# Patient Record
Sex: Female | Born: 1950 | Race: Black or African American | Hispanic: No | Marital: Married | Smoking: Never smoker
Health system: Southern US, Community
[De-identification: ages and names within clinical notes are randomized; demographics above are authoritative.]

## PROBLEM LIST (undated history)

## (undated) DIAGNOSIS — E079 Disorder of thyroid, unspecified: Secondary | ICD-10-CM

## (undated) DIAGNOSIS — Z9889 Other specified postprocedural states: Secondary | ICD-10-CM

## (undated) DIAGNOSIS — Z860100 Personal history of colon polyps, unspecified: Secondary | ICD-10-CM

## (undated) DIAGNOSIS — K589 Irritable bowel syndrome without diarrhea: Secondary | ICD-10-CM

## (undated) DIAGNOSIS — M1990 Unspecified osteoarthritis, unspecified site: Secondary | ICD-10-CM

## (undated) DIAGNOSIS — R112 Nausea with vomiting, unspecified: Secondary | ICD-10-CM

## (undated) DIAGNOSIS — Z8489 Family history of other specified conditions: Secondary | ICD-10-CM

## (undated) DIAGNOSIS — K219 Gastro-esophageal reflux disease without esophagitis: Secondary | ICD-10-CM

## (undated) HISTORY — DX: Personal history of colonic polyps: Z86.010

## (undated) HISTORY — DX: Unspecified osteoarthritis, unspecified site: M19.90

## (undated) HISTORY — DX: Disorder of thyroid, unspecified: E07.9

## (undated) HISTORY — DX: Irritable bowel syndrome, unspecified: K58.9

## (undated) HISTORY — DX: Gastro-esophageal reflux disease without esophagitis: K21.9

## (undated) HISTORY — DX: Personal history of colon polyps, unspecified: Z86.0100

## (undated) HISTORY — PX: TUBAL LIGATION: SHX77

## (undated) HISTORY — PX: TONSILLECTOMY: SUR1361

---

## 1990-12-27 HISTORY — PX: KNEE ARTHROSCOPY: SUR90

## 2004-12-27 HISTORY — PX: ROTATOR CUFF REPAIR: SHX139

## 2005-05-01 LAB — HM COLONOSCOPY: HM Colonoscopy: NORMAL

## 2006-07-21 ENCOUNTER — Ambulatory Visit: Payer: Self-pay | Admitting: Specialist

## 2006-09-14 ENCOUNTER — Ambulatory Visit: Payer: Self-pay | Admitting: Specialist

## 2007-03-14 ENCOUNTER — Ambulatory Visit: Payer: Self-pay | Admitting: Family Medicine

## 2007-03-14 ENCOUNTER — Encounter: Payer: Self-pay | Admitting: Family Medicine

## 2008-04-15 ENCOUNTER — Ambulatory Visit: Payer: Self-pay | Admitting: Gynecology

## 2008-04-15 ENCOUNTER — Encounter (INDEPENDENT_AMBULATORY_CARE_PROVIDER_SITE_OTHER): Payer: Self-pay | Admitting: Gynecology

## 2008-04-23 ENCOUNTER — Ambulatory Visit: Payer: Self-pay | Admitting: Gynecology

## 2009-04-17 ENCOUNTER — Encounter: Payer: Self-pay | Admitting: Obstetrics & Gynecology

## 2009-04-17 ENCOUNTER — Encounter: Payer: Self-pay | Admitting: Family Medicine

## 2009-04-17 ENCOUNTER — Ambulatory Visit: Payer: Self-pay | Admitting: Obstetrics & Gynecology

## 2009-04-17 LAB — CONVERTED CEMR LAB
ALT: 14 units/L (ref 0–35)
AST: 17 units/L (ref 0–37)
Albumin: 4.4 g/dL (ref 3.5–5.2)
Alkaline Phosphatase: 49 units/L (ref 39–117)
BUN: 14 mg/dL (ref 6–23)
CO2: 24 meq/L (ref 19–32)
Calcium: 9.5 mg/dL (ref 8.4–10.5)
Chloride: 106 meq/L (ref 96–112)
Cholesterol: 217 mg/dL — ABNORMAL HIGH (ref 0–200)
Creatinine, Ser: 0.95 mg/dL (ref 0.40–1.20)
Glucose, Bld: 88 mg/dL (ref 70–99)
HCT: 41.1 % (ref 36.0–46.0)
HDL: 64 mg/dL (ref >39)
Hemoglobin: 13.7 g/dL (ref 12.0–15.0)
LDL Cholesterol: 130 mg/dL — ABNORMAL HIGH (ref 0–99)
MCHC: 33.3 g/dL (ref 30.0–36.0)
MCV: 83.4 fL (ref 78.0–100.0)
Platelets: 274 10*3/uL (ref 150–400)
Potassium: 4.3 meq/L (ref 3.5–5.3)
RBC: 4.93 M/uL (ref 3.87–5.11)
RDW: 16.2 % — ABNORMAL HIGH (ref 11.5–15.5)
Sodium: 142 meq/L (ref 135–145)
TSH: 2.774 microintl units/mL (ref 0.350–4.500)
Total Bilirubin: 0.6 mg/dL (ref 0.3–1.2)
Total CHOL/HDL Ratio: 3.4
Total Protein: 7.3 g/dL (ref 6.0–8.3)
Triglycerides: 113 mg/dL (ref <150)
VLDL: 23 mg/dL (ref 0–40)
WBC: 6.5 10*3/uL (ref 4.0–10.5)

## 2009-07-02 ENCOUNTER — Ambulatory Visit: Payer: Self-pay | Admitting: Obstetrics & Gynecology

## 2009-07-02 LAB — CONVERTED CEMR LAB: Vit D, 25-Hydroxy: 33 ng/mL (ref 30–89)

## 2010-05-06 ENCOUNTER — Ambulatory Visit: Payer: Self-pay | Admitting: Family Medicine

## 2010-05-06 LAB — CONVERTED CEMR LAB
ALT: 21 units/L (ref 0–35)
AST: 22 units/L (ref 0–37)
Albumin: 4.5 g/dL (ref 3.5–5.2)
Alkaline Phosphatase: 58 units/L (ref 39–117)
BUN: 13 mg/dL (ref 6–23)
CO2: 24 meq/L (ref 19–32)
Calcium: 9.5 mg/dL (ref 8.4–10.5)
Chloride: 105 meq/L (ref 96–112)
Cholesterol: 241 mg/dL — ABNORMAL HIGH (ref 0–200)
Creatinine, Ser: 1.01 mg/dL (ref 0.40–1.20)
Glucose, Bld: 83 mg/dL (ref 70–99)
HCT: 40.7 % (ref 36.0–46.0)
HDL: 67 mg/dL (ref >39)
Hemoglobin: 13.5 g/dL (ref 12.0–15.0)
LDL Cholesterol: 146 mg/dL — ABNORMAL HIGH (ref 0–99)
MCHC: 33.2 g/dL (ref 30.0–36.0)
MCV: 83.9 fL (ref 78.0–100.0)
Platelets: 278 10*3/uL (ref 150–400)
Potassium: 4.8 meq/L (ref 3.5–5.3)
RBC: 4.85 M/uL (ref 3.87–5.11)
RDW: 16.3 % — ABNORMAL HIGH (ref 11.5–15.5)
Sodium: 141 meq/L (ref 135–145)
TSH: 1.385 microintl units/mL (ref 0.350–4.500)
Total Bilirubin: 0.4 mg/dL (ref 0.3–1.2)
Total CHOL/HDL Ratio: 3.6
Total Protein: 7.3 g/dL (ref 6.0–8.3)
Triglycerides: 141 mg/dL (ref <150)
VLDL: 28 mg/dL (ref 0–40)
WBC: 6.9 10*3/uL (ref 4.0–10.5)

## 2010-07-21 ENCOUNTER — Ambulatory Visit: Payer: Self-pay | Admitting: Internal Medicine

## 2010-08-07 ENCOUNTER — Ambulatory Visit: Payer: Self-pay | Admitting: Family Medicine

## 2010-08-07 ENCOUNTER — Encounter: Payer: Self-pay | Admitting: Obstetrics & Gynecology

## 2010-08-07 LAB — CONVERTED CEMR LAB
Cholesterol: 219 mg/dL — ABNORMAL HIGH (ref 0–200)
HDL: 60 mg/dL (ref >39)
LDL Cholesterol: 142 mg/dL — ABNORMAL HIGH (ref 0–99)
Total CHOL/HDL Ratio: 3.7
Triglycerides: 83 mg/dL (ref <150)
VLDL: 17 mg/dL (ref 0–40)

## 2011-05-11 LAB — HM PAP SMEAR: HM Pap smear: NORMAL

## 2011-05-11 NOTE — Assessment & Plan Note (Signed)
NAME:  Katelyn Friedman, Katelyn Friedman NO.:  [id number]   MEDICAL RECORD NO.:  [id number]          PATIENT TYPE:  POB   LOCATION:  [facility] at [facility]         FACILITY:  [facility]   PHYSICIAN:  Johnella Moloney, MD        DATE OF BIRTH:  [birth year]   DATE OF SERVICE:  04/17/2009                                  CLINIC NOTE   Ms. Triska is a 60 year old gravida 2, para 1-0-1-1 postmenopausal female  who is here today for her annual examination.  The patient was last seen  in April 2009 for her annual examination.  She denies any gynecologic  complaints or concerns, specifically denies postmenopausal bleeding,  abnormal vaginal discharge, problems with sexual intercourse, or any  other concerns.  She does feel that some vaginal dryness occasionally  during intercourse but it does not lead to dyspareunia for her and is  alleviated by using K-Y water-based lubricant jelly.  The patient is  also interested in getting lipid profile checked today and thyroid  hormone labs, her blood count labs, and also to check her renal function  labs.  Of note, she does not have a regular primary care physician.   PAST OBSTETRICS/GYNECOLOGIC HISTORY:  Gravida 2, para 1 with one vaginal  delivery and one miscarriage.  The patient had menarche at age 74 and  menopause at age 69.  She has not been on any hormone replacement  therapy.  She denies history of abnormal Pap smears.  Her last Pap was 1  year ago and was normal.  She denies any sexually transmitted  infections.  Her last mammogram was on May 16, 2008, which was negative  and another mammogram will be scheduled at the end of this visit.  Her  last colonoscopy was benign in 2003 and her DEXA scan was done in March  2008 which showed osteopenia in the region of her lumbar spine with a  standard deviation of -2.47.  Her hip DEXA scan was normal with a  standard deviation of negative 0.66.  The patient has been on calcium  1200 mg daily and also vitamin 800  International Units daily for her  osteopenia.  She will be scheduled for a followup DEXA scan at the end  of this visit.   PAST MEDICAL HISTORY:  Osteoarthritis, hypercholesterinemia,  gastroesophageal reflux disease, and genital herpes, last outbreak was 6  months ago.  The patient takes acyclovir during her outbreaks but does  not take any daily suppressive therapy.   PAST SURGICAL HISTORY:  Rotator cuff repair in September 2007 and tubal  ligation in 1984.   MEDICATIONS:  1. Protonix as needed.  2. Acyclovir 400 mg p.o. b.i.d. as needed.  3. Calcium 1200 mg p.o. daily plus vitamin D 800 International Units      p.o. daily.  4. Multivitamin p.o. daily.   ALLERGIES:  The patient has a CODEINE sensitivity that makes her  nauseated, but she does not have any true allergies to any medicine or  food.   SOCIAL HISTORY:  The patient works as an Print production planner for [employer].  She lives with her husband and feels safe at  home.  Denies any past or current history of sexual or physical abuse.  She denies any tobacco, alcohol, or illicit drug use.  The patient  exercises daily by walking and sometimes aerobics.   Family history is remarkable for mother who was diagnosed in early 80s  with Alzheimer disease.  There is also a positive family history for  heart disease and high blood pressure, and she also has a sister who has  DVTs.   REVIEW OF SYSTEMS:  Comprehensive review of systems was reviewed and  entirely negative apart from what was mentioned in the history of  present illness.   PHYSICAL EXAMINATION:  VITAL SIGNS:  Blood pressure is 136/85, pulse 56,  weight 165 pounds, height 5 feet 3 inches.  GENERAL:  No apparent distress.  HEENT:  Normocephalic, atraumatic.  NECK:  Normal neck.  No thyroid enlargement.  LUNGS:  Clear to auscultation bilaterally.  HEART:  Regular rate and rhythm.  BREASTS:  Soft, symmetric in size.  No abnormal masses, nipple   discharge.  Skin changes on lymphadenopathy.  ABDOMEN:  Soft, nontender, nondistended.  No abnormal masses or  organomegaly.  EXTREMITIES:  No cyanosis, clubbing, or edema and nontender.  PELVIC:  Normal external female genitalia.  Pink vagina with some loss  of rugae but normal discharge.  Multiparous cervix with normal contour  and no lesions.  Pap smear sample was obtained.  On bimanual exam,  uterus is small, anteverted, and mobile.  No abnormal masses or  tenderness.  Adnexa are normal in size and are nontender on palpation.   ASSESSMENT AND PLAN:  The patient is a 60 year old gravida 2, para 1-0-1-  1 postmenopausal female who is here for her annual gynecologic  examination.  We will follow up on her Pap result, and the patient will  be booked for a mammogram and a DEXA scan.  She will have labs checked  today which will consist of a lipid panel, TCH, CBC, and CMET.  As per  the patient's request, she is fasting at this time.  These results will  be communicated to the patient as soon as they are available.  The  patient was told to call the GYN Clinic or urge come in for evaluation  for any further gynecologic concerns.           ______________________________  Johnella Moloney, MD     UD/MEDQ  D:  04/17/2009  T:  04/17/2009  Job:  [id number]

## 2011-05-11 NOTE — Assessment & Plan Note (Signed)
NAME:  Katelyn Friedman, MOTHERSHEAD NO.:  [id number]   MEDICAL RECORD NO.:  [id number]          PATIENT TYPE:  POB   LOCATION:  [facility] at [facility]         FACILITY:  [facility]   PHYSICIAN:  Tinnie Gens, MD        DATE OF BIRTH:  09-12-1951   DATE OF SERVICE:  05/06/2010                                  CLINIC NOTE   CHIEF COMPLAINT:  Yearly exam.   HISTORY OF PRESENT ILLNESS:  The patient is a 60 year old gravida 2,  para 1-0-1-1, who comes in today for annual exam.  She is postmenopausal  for approximately 10 years.  She has no significant bleeding.  She is  concerned about weight gain which she has put on approximately 30 pounds  since the last year's visit where she was more actively watching her  diet and exercising.  She has required knee surgery this year since she  has been unable to keep with her normal exercise regimen.  Otherwise,  she is without significant complaint today.   PAST MEDICAL HISTORY:  1. Osteoarthritis.  2. Hypercholesterolemia.  3. GERD.  4. Genital herpes.   PAST SURGICAL HISTORY:  1. Rotator cuff repair.  2. Tubal ligation.  3. Meniscus repair.   MEDICATIONS:  1. Protonix 40 mg p.o. daily.  2. Acyclovir 400 mg p.o. b.i.d. p.r.n.  3. Calcium 1200 mg p.o. daily plus vitamin D 800 international units      p.o. daily.  4. Multivitamin 1 p.o. daily.   ALLERGIES:  CODEINE makes her nauseated.   SOCIAL HISTORY:  She works as an Print production planner for [employer].  She lives with her husband.  No tobacco, alcohol, or  drug use.   FAMILY HISTORY:  She has a mother who has deceased who had Alzheimer  disease.  Her father is in his late 85s and very healthy.  There is a  family history of heart disease, hypertension, and she has a sister who  has had a DVT in the past.   REVIEW OF SYSTEMS:  A 14-point review of systems is reviewed.  She  denies headache, vision changes, trouble swallowing, nausea, vomiting,  diarrhea,  constipation, fevers, chills, abdominal pain, blood in her  stool, blood in her urine, swelling in her feet or ankles, masses in her  breasts, vaginal discharge.   PHYSICAL EXAMINATION:  VITAL SIGNS:  The patient's weight today is 195  pounds, her blood pressure is 153/84.  GENERAL:  She is a well-developed, well-nourished female, in no acute  distress.  HEENT:  Normocephalic, atraumatic.  Sclerae anicteric.  NECK:  Supple.  Normal thyroid.  LUNGS:  Clear bilaterally.  CV:  Regular rate and rhythm without rubs, gallops, or murmurs.  ABDOMEN:  Soft, nontender, nondistended.  EXTREMITIES:  No cyanosis, clubbing, or edema.  2+ distal pulses.  BREASTS:  Symmetric with everted nipples.  No masses.  No  supraclavicular or axillary adenopathy.  PELVIC/GU:  Normal external female genitalia.  BUS is normal.  Vagina is  pink.  Normal amount of discharge.  Minimal loss of rugation.  Cervix is  multiparous without lesion.  Uterus is small, anteverted.  No adnexal  mass  or tenderness.   IMPRESSION:  GYN exam.  The patient had a DEXA scan which showed her to  have a T-score of -2.58 in the lumbar region, the hip, and the femoral  neck.  She actually has pretty good scores and she continues on Fosamax  daily.  Followup will be and approximately in 2 years for this.   PLAN:  Lipid, TSH, CBC, CMET, DEXA next year, mammogram this year.  She  will call the clinic if she wants to have her colonoscopy rescheduled.  I have written prescriptions for her medications for the year.           ______________________________  Tinnie Gens, MD     TP/MEDQ  D:  05/06/2010  T:  05/07/2010  Job:  [id number]

## 2011-05-11 NOTE — Assessment & Plan Note (Signed)
NAME:  Katelyn Friedman, BRUNI NO.:  [id number]   MEDICAL RECORD NO.:  [id number]          PATIENT TYPE:  POB   LOCATION:  [facility]         FACILITY:  [facility]   PHYSICIAN:  Ginger Carne, MD DATE OF BIRTH:  12-Feb-1951   DATE OF SERVICE:  04/15/2008                                  CLINIC NOTE   Ms. Pringle returns today for a yearly gynecologic evaluation.  She has no  cardiac complaints.  Five years ago, she had a colonoscopy which was  reported to have polyps and is due for another.  She is not prepared to  have a colonoscopy at this time; was encouraged to do so and hopefully  will have another in the next year or two, which we will arrange.  The  patient has no specific GI or GU complaints.  The patient in 2008 had a  DEXA bone scan which demonstrated osteopenia.  She has stopped using  Evista or calcium.  She has had no menses for approximately 6 years.   PHYSICAL EXAMINATION:  Her physical examination is normal and can be  found in the check-off sheet for 2009.   IMPRESSION:  Normal gynecological examination post menopause.   PLAN:  I encouraged the patient to have a mammogram, and she will call  back for this.  Also, to start 1000 mg of calcium to take one 500 mg  tablet twice a day, and also to think about having a repeat colonoscopy.           ______________________________  Ginger Carne, MD     SHB/MEDQ  D:  04/15/2008  T:  04/15/2008  Job:  [id number]

## 2011-05-14 NOTE — Assessment & Plan Note (Signed)
NAME:  MARRY, KUSCH NO.:  [id number]   MEDICAL RECORD NO.:  [id number]          PATIENT TYPE:  POB   LOCATION:  [facility] at [facility]         FACILITY:  [facility]   PHYSICIAN:  Matt Holmes, N.P.       DATE OF BIRTH:  1951-03-22   DATE OF SERVICE:                                  CLINIC NOTE   Ms. Bignell is a 60 year old female who presents for her annual exam and  pap smear.  She states that she was taking Evista for hormone  replacement until approximately 3 or 4 months ago, at which time she  discontinued the medication because she was tired of taking it.  She  states she only has a rare hot flash, not needing medication.   PAST MEDICAL HISTORY:  The patient reports a history of arthritis, high  cholesterol and acid reflux, which are unchanged.   REVIEW OF SYSTEMS:  She states that she has muscle aches and occasional  hot flashes.   MENSTRUAL HISTORY:  Menarche at age 46, she is no longer having cycles.  She therefore is not on any contraceptive.  States that she has 2  pregnancies, 1 miscarriage and 1 living child.  She denies history of  abnormal genital cytology.  Her last mammogram was 2 years ago, which  was normal and her last colonoscopy was 3 years ago.   PAST SURGICAL HISTORY:  Rotator cuff in September of 2007 and tubal  ligation in 1984.  She denies having received a blood transfusion.   FAMILY HISTORY:  Positive for heart disease, high blood pressure and a  sister who had blood clots.   VACCINATIONS:  She has been immunized for flu this season.   MEDICATIONS:  Protonix for her acid reflux.   She denies allergies to food or medications.   PHYSICAL EXAMINATION:  VITAL SIGNS:  Pulse 66, blood pressure 120/82,  weight 192, height 5 foot 3 inches.  Ms. Willison is a well-developed, well-nourished female.  HEAD, EARS, EYES, NOSE AND THROAT:  Without thyroid enlargement.  CHEST:  Clear to A and P.  HEART:  Rate and rhythm were regular without murmur,  gallop or cardiac  enlargement.  BREAST:  Bilaterally soft without masses, node or nipple discharge.  ABDOMEN:  Soft and scaphoid, without masses or organomegaly.  PELVIC:  External genitalia within normal limits for female.  Vagina  clean and rugose.  Cervix is parous and clean. Uterus firm without  masses or tenderness. Adnexa bilaterally clear.  Rectovaginal exam  confirms.  EXTREMITIES:  Some limitations to motion of her right shoulder due to  recent rotator cuff surgery.   ASSESSMENT:  1. Post-menopausal female without hormone replacement.  2. Normal GYN exam.   PLAN:  1. Pap results in 1 to 2 weeks.  We will contact the patient if the      pap smear is abnormal.  2. She did mention some soreness on her heel, which she plans to have      evaluated if it worsens with her primary care physician.  3. Bone density and mammogram were scheduled at [facility],      results will be sent here.  She is to return to the office in one      year for her annual exam.           ______________________________  Matt Holmes, N.P.     EMK/MEDQ  D:  03/14/2007  T:  03/15/2007  Job:  [id number]

## 2011-05-27 ENCOUNTER — Ambulatory Visit: Payer: Self-pay | Admitting: Family Medicine

## 2011-06-03 ENCOUNTER — Ambulatory Visit: Payer: Self-pay | Admitting: Obstetrics & Gynecology

## 2011-06-17 ENCOUNTER — Ambulatory Visit: Payer: Self-pay | Admitting: Obstetrics & Gynecology

## 2011-06-23 ENCOUNTER — Ambulatory Visit (INDEPENDENT_AMBULATORY_CARE_PROVIDER_SITE_OTHER): Payer: 59 | Admitting: Obstetrics & Gynecology

## 2011-06-23 DIAGNOSIS — Z01419 Encounter for gynecological examination (general) (routine) without abnormal findings: Secondary | ICD-10-CM

## 2011-06-23 DIAGNOSIS — Z1272 Encounter for screening for malignant neoplasm of vagina: Secondary | ICD-10-CM

## 2011-06-24 NOTE — Assessment & Plan Note (Signed)
NAMEMarland Friedman  Friedman, Katelyn NO.:  [id number]  MEDICAL RECORD NO.:  [id number]           PATIENT TYPE:  LOCATION:  [facility]           FACILITY:  PHYSICIAN:  Jaynie Collins, MD          DATE OF BIRTH:  DATE OF SERVICE:  06/23/2011                                 CLINIC NOTE  REASON FOR VISIT:  Annual examination.  Ms. Katelyn Friedman is a 60 year old gravida 2, para 1-0-1-1 postmenopausal woman who is here today for annual examination.  The patient has no concerns. She is wanting a refill of her medications and wants to be scheduled for her preventative health maintenance studies.  The patient does not have a primary care physician and has been referred for one.  PAST OB/GYN HISTORY:  The patient is menopausal.  She has had one vaginal delivery and one miscarriage.  She denies abnormal Pap smears. She does have a history of genital herpes, but no other sexually transmitted infections.  She has about 1 to 2 outbreaks a year and is on acyclovir suppression.  PAST MEDICAL HISTORY: 1. Osteoporosis. 2. Osteoarthritis. 3. Hypercholesterinemia. 4. GERD. 5. Genital herpes.  PAST SURGICAL HISTORY:  Rotator cuff repair, tubal ligation, meniscus repair.  MEDICATIONS: 1. Protonix 40 mg daily. 2. Acyclovir 40 mg twice a day as needed. 3. Calcium supplement. 4. Multivitamins daily. 5. Fosamax 70 mg weekly.  ALLERGIES:  No known drug allergies.  The patient does report that CODEINE makes her nauseated.  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  No tobacco, alcohol, or illicit drug use.  FAMILY HISTORY:  No family history of any gynecologic cancers.  She does have a sister who had a DVT in the past.  There is a family history of heart disease and hypertension.  REVIEW OF SYSTEMS:  14-point review of systems was reviewed and was entirely negative.  PHYSICAL EXAMINATION:  VITAL SIGNS:  Blood  pressure 138/78, pulse 54, weight is 165 pounds, height 63 inches. GENERAL:  No apparent distress. HEENT:  Normocephalic, atraumatic. NECK:  Supple.  Normal thyroid. LUNGS:  Clear to auscultation bilaterally. HEART:  Regular rate and rhythm. ABDOMEN:  Soft, nontender, and nondistended. EXTREMITIES:  No cyanosis, clubbing, or edema. BREASTS:  Symmetric, no abnormal masses, skin changes, nipple drainage, or lymphadenopathy. PELVIC:  Normal external female genitalia.  Pink, well-rugated vagina. No abnormal discharge seen.  Cervix is multiparous, but cervical stenosis is noted on evaluation.  An attempt was made to obtain an endocervical sample of her Pap smear, but this was not possible. Ectocervical Pap smear was obtained and sent.  On bimanual exam, the patient has a normal-sized uterus, anteverted.  No adnexal masses or tenderness.  ASSESSMENT/PLAN:  The patient is a 60 year old gravida 2, para 1-0-1-1 who is here today for her annual examination.  Pap smear was done, but is likely to be inadequate given the absence of the endocervical component.  If this does result as such she might need to have Cytotec per vagina prior to the next attempt of getting another Pap smear.  The patient would also be scheduled for her mammogram and DEXA scan  to follow up on osteoporosis.  She is also going to be referred for primary care physician for further referral for colonoscopy and her other preventative health maintenance labs will be drawn today including CBC, TSH, CMET, fasting lipid panel, and we will follow up on all these results.  The patient was told to call or come back in for further gynecologic concerns.          ______________________________ Jaynie Collins, MD    UA/MEDQ  D:  06/23/2011  T:  06/24/2011  Job:  [id number]

## 2011-09-16 ENCOUNTER — Other Ambulatory Visit: Payer: Self-pay | Admitting: *Deleted

## 2011-09-16 MED ORDER — VALACYCLOVIR HCL 1 G PO TABS: 1000.0000 mg | ORAL_TABLET | Freq: Every day | ORAL | Status: AC

## 2011-09-16 NOTE — Telephone Encounter (Signed)
Patient wishes to have a 90 day supply of Valtrex (generic).  She can get 90 days at the same cost as 60 days.

## 2012-01-19 ENCOUNTER — Other Ambulatory Visit: Payer: Self-pay | Admitting: Gynecology

## 2012-01-19 DIAGNOSIS — B009 Herpesviral infection, unspecified: Secondary | ICD-10-CM

## 2012-01-19 MED ORDER — ACYCLOVIR 200 MG PO CAPS: 200.0000 mg | ORAL_CAPSULE | Freq: Two times a day (BID) | ORAL | Status: AC

## 2012-04-28 ENCOUNTER — Ambulatory Visit (INDEPENDENT_AMBULATORY_CARE_PROVIDER_SITE_OTHER): Payer: BC Managed Care – PPO | Admitting: Internal Medicine

## 2012-04-28 VITALS — BP 124/66 | HR 60 | Temp 98.2°F | Resp 12 | Ht 62.0 in | Wt 172.0 lb

## 2012-04-28 DIAGNOSIS — F489 Nonpsychotic mental disorder, unspecified: Secondary | ICD-10-CM

## 2012-04-28 DIAGNOSIS — F419 Anxiety disorder, unspecified: Secondary | ICD-10-CM

## 2012-04-28 DIAGNOSIS — B009 Herpesviral infection, unspecified: Secondary | ICD-10-CM

## 2012-04-28 DIAGNOSIS — E669 Obesity, unspecified: Secondary | ICD-10-CM

## 2012-04-28 DIAGNOSIS — M129 Arthropathy, unspecified: Secondary | ICD-10-CM

## 2012-04-28 DIAGNOSIS — J309 Allergic rhinitis, unspecified: Secondary | ICD-10-CM

## 2012-04-28 DIAGNOSIS — Z86010 Personal history of colonic polyps: Secondary | ICD-10-CM

## 2012-04-28 DIAGNOSIS — K219 Gastro-esophageal reflux disease without esophagitis: Secondary | ICD-10-CM

## 2012-04-28 DIAGNOSIS — M1990 Unspecified osteoarthritis, unspecified site: Secondary | ICD-10-CM

## 2012-04-28 DIAGNOSIS — F411 Generalized anxiety disorder: Secondary | ICD-10-CM

## 2012-04-28 MED ORDER — ALPRAZOLAM 0.25 MG PO TABS: 0.2500 mg | ORAL_TABLET | Freq: Every evening | ORAL | Status: DC | PRN

## 2012-04-28 NOTE — Patient Instructions (Signed)
Consider the Low Glycemic Index Diet and 6 smaller meals daily .  This boosts your metabolism and regulates your sugars:   7 AM Low carbohydrate Protein  Shakes (EAS Carb Control  Or Atkins ,  Available everywhere,   In  cases at [employer] )  2.5 carbs  (Add or substitute a toasted sandwhich thin w/ peanut butter)  10 AM: Protein bar by Atkins (snack size,  Chocolate lover's variety at  [employer])    Lunch: sandwich on pita bread or flatbread (Joseph's makes a pita bread and a flat bread , available at [employer] and [employer]; Toufayah makes a low carb flatbread available at [employer] and [employer]) Mission makes a low carb whole wheat tortilla available at [employer],and most grocery stores   3 PM:  Mid day :  Another protein bar,  Or a  cheese stick, 1/4 cup of almonds, walnuts, pistachios, pecans, peanuts,  Macadamia nuts  6 PM  Dinner:  "mean and green:"  Meat/chicken/fish, salad, and green veggie : use ranch, vinagrette,  Blue cheese, etc  9 PM snack : Breyer's low carb fudgsicle or  ice cream bar (Carb Smart), or  Weight Watcher's ice cream bar , or another protein shake  

## 2012-05-01 ENCOUNTER — Encounter: Payer: Self-pay | Admitting: Internal Medicine

## 2012-05-01 DIAGNOSIS — B009 Herpesviral infection, unspecified: Secondary | ICD-10-CM | POA: Insufficient documentation

## 2012-05-01 DIAGNOSIS — E669 Obesity, unspecified: Secondary | ICD-10-CM | POA: Insufficient documentation

## 2012-05-01 DIAGNOSIS — M1990 Unspecified osteoarthritis, unspecified site: Secondary | ICD-10-CM | POA: Insufficient documentation

## 2012-05-01 DIAGNOSIS — F419 Anxiety disorder, unspecified: Secondary | ICD-10-CM | POA: Insufficient documentation

## 2012-05-01 DIAGNOSIS — K219 Gastro-esophageal reflux disease without esophagitis: Secondary | ICD-10-CM | POA: Insufficient documentation

## 2012-05-01 DIAGNOSIS — E663 Overweight: Secondary | ICD-10-CM | POA: Insufficient documentation

## 2012-05-01 DIAGNOSIS — J309 Allergic rhinitis, unspecified: Secondary | ICD-10-CM | POA: Insufficient documentation

## 2012-05-01 DIAGNOSIS — Z86010 Personal history of colonic polyps: Secondary | ICD-10-CM | POA: Insufficient documentation

## 2012-05-01 LAB — HM MAMMOGRAPHY: HM Mammogram: NORMAL

## 2012-05-01 NOTE — Assessment & Plan Note (Signed)
Suppressed with daily acyclovir.

## 2012-05-01 NOTE — Assessment & Plan Note (Signed)
I have addressed  BMI and recommended a low glycemic index diet utilizing smaller more frequent meals (< 200 cal per snack,  Dinner"lean and green" ) to increase metabolism.  I have also recommended that patient start exercising with a goal of 30 minutes of aerobic exercise a minimum of 5 days per week. Screening for lipid disorders, thyroid and diabetes to be done today.

## 2012-05-01 NOTE — Assessment & Plan Note (Signed)
Last colonoscopy was 7 years ago,  Records requested.

## 2012-05-01 NOTE — Assessment & Plan Note (Signed)
Symptoms are managed with daily prilosec but has had GERD for years.   She has no recurrent cough. Will recommended EGD with next colonoscopy.

## 2012-05-01 NOTE — Assessment & Plan Note (Signed)
Trial of low dose alprazolam prn insomnia.

## 2012-05-01 NOTE — Progress Notes (Signed)
Patient ID: Katelyn Friedman, female   DOB: 1951/06/11, 62 y.o.   MRN: [id number]  Patient Active Problem List  Diagnoses  . Arthritis  . Allergic rhinitis  . Herpes simplex  . GERD (gastroesophageal reflux disease)  . History of colon polyps  . Obesity (BMI 30-39.9)  . Insomnia secondary to anxiety    Subjective:  CC:   No chief complaint on file.   HPI:   Katelyn Friedman a 61 y.o. female who presents To establish primary  Care.  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  All issues are chronic and managed with current medications except her insomnia.  She has used alprazolam in the past on an intermittent basis at very low doses and is interested in trying it again.  She walks intermittently for exercise , limite by knee pain, but would like to lose weight. Does not follow a specific diet.     Past Medical History  Diagnosis Date  . Arthritis   . Allergic rhinitis   . Herpes simplex   . GERD (gastroesophageal reflux disease)   . History of colon polyps     Past Surgical History  Procedure Date  . Rotator cuff repair 2006    Reita Chard   . Knee arthroscopy 1992         The following portions of the patient's history were reviewed and updated as appropriate: Allergies, current medications, and problem list.    Review of Systems:   12 Pt  review of systems was negative except those addressed in the HPI,     History   Social History  . Marital Status: Married    Spouse Name: N/A    Number of Children: N/A  . Years of Education: N/A   Occupational History  . Not on file.   Social History Main Topics  . Smoking status: Never Smoker   . Smokeless tobacco: Not on file  . Alcohol Use: No  . Drug Use: Not on file  . Sexually Active: Not on file   Other Topics Concern    . Not on file   Social History Narrative  . No narrative on file    Objective:  BP 124/66  Pulse 60  Temp 98.2 F (36.8 C)  Resp 12  Ht 5\' 2"  (1.575 m)  Wt 172 lb (78.019 kg)  BMI 31.46 kg/m2  SpO2 98%  General appearance: alert, cooperative and appears stated age Ears: normal TM's and external ear canals both ears Throat: lips, mucosa, and tongue normal; teeth and gums normal Neck: no adenopathy, no carotid bruit, supple, symmetrical, trachea midline and thyroid not enlarged, symmetric, no tenderness/mass/nodules Back: symmetric, no curvature. ROM normal. No CVA tenderness. Lungs: clear to auscultation bilaterally Heart: regular rate and rhythm, S1, S2 normal, no murmur, click, rub or gallop Abdomen: soft, non-tender; bowel sounds normal; no masses,  no organomegaly Pulses: 2+ and symmetric Skin: Skin color, texture, turgor normal. No rashes or lesions Lymph nodes: Cervical, supraclavicular, and axillary nodes normal.  Assessment and Plan:  Obesity (BMI 30-39.9) I have addressed  BMI and recommended a low glycemic index diet utilizing smaller more frequent meals (< 200 cal per snack,  Dinner"lean and green" ) to increase metabolism.  I have also recommended that patient start exercising with a goal of  30 minutes of aerobic exercise a minimum of 5 days per week. Screening for lipid disorders, thyroid and diabetes to be done today.    Insomnia secondary to anxiety Trial of low dose alprazolam prn insomnia.   GERD (gastroesophageal reflux disease) Symptoms are managed with daily prilosec but has had GERD for years.   She has no recurrent cough. Will recommended EGD with next colonoscopy.   Herpes simplex Suppressed with daily acyclovir.   History of colon polyps Last colonoscopy was 7 years ago,  Records requested.     Updated Medication List Outpatient Encounter Prescriptions as of 04/28/2012  Medication Sig Dispense Refill  . ALPRAZolam (XANAX) 0.25 MG tablet  Take 1 tablet (0.25 mg total) by mouth at bedtime as needed for anxiety.  30 tablet  3     Orders Placed This Encounter  Procedures  . HM MAMMOGRAPHY  . HM PAP SMEAR  . HM COLONOSCOPY    No Follow-up on file.

## 2012-05-09 ENCOUNTER — Telehealth: Payer: Self-pay | Admitting: Internal Medicine

## 2012-05-09 NOTE — Telephone Encounter (Signed)
Caller: Gwen/Patient; PCP: Duncan Dull; CB#: [phone]; ; ; Call regarding Muscle Spasms, Ibuprofen Not Helping;   Upper back pain since  05/08/2012 and has been tolerable until   today.  Pain has increased and radiates under both shoulder blades. Worsens with mvt and if she takes a deep breath.Have taken Ibuprofen 400mg  doses several times but no relief. Has also tried Heating pad.  No known injury. RN reached See in 24 hrs for pain that intensifies with straining per Back Symptoms.  - RN able to sched appt with Dr Dan Humphreys for  tomorrow  05/10/2012  at 0945.

## 2012-05-10 ENCOUNTER — Ambulatory Visit (INDEPENDENT_AMBULATORY_CARE_PROVIDER_SITE_OTHER): Payer: BC Managed Care – PPO | Admitting: Internal Medicine

## 2012-05-10 ENCOUNTER — Encounter: Payer: Self-pay | Admitting: Internal Medicine

## 2012-05-10 ENCOUNTER — Ambulatory Visit (INDEPENDENT_AMBULATORY_CARE_PROVIDER_SITE_OTHER)
Admission: RE | Admit: 2012-05-10 | Discharge: 2012-05-10 | Disposition: A | Discharge status: Home / Self Care | Payer: BC Managed Care – PPO | Source: Ambulatory Visit | Attending: Internal Medicine | Admitting: Internal Medicine

## 2012-05-10 VITALS — BP 129/85 | HR 58 | Temp 98.2°F | Resp 14 | Wt 171.2 lb

## 2012-05-10 DIAGNOSIS — R071 Chest pain on breathing: Secondary | ICD-10-CM

## 2012-05-10 DIAGNOSIS — R0789 Other chest pain: Secondary | ICD-10-CM | POA: Insufficient documentation

## 2012-05-10 MED ORDER — TRAMADOL HCL 50 MG PO TABS
50.0000 mg | ORAL_TABLET | Freq: Three times a day (TID) | ORAL | Status: AC | PRN
Start: 2012-05-10 — End: 2012-05-20

## 2012-05-10 NOTE — Progress Notes (Signed)
  Subjective:    Patient ID: Katelyn Friedman, female    DOB: [birth year], 61 y.o.   MRN: [id number]  HPI 61 year old female presents for acute visit complaining of 2 day history of right middle back pain. She denies any trauma or change in her physical activity. She describes the pain as deep and aching. It is worsened with movement and with taking a deep breath. It is not associated with cough, shortness of breath, palpitations, or other symptoms. She has not had fever or chills. She has been taking ibuprofen with some improvement in her pain.  Outpatient Encounter Prescriptions as of 05/10/2012  Medication Sig Dispense Refill  . ALPRAZolam (XANAX) 0.25 MG tablet Take 1 tablet (0.25 mg total) by mouth at bedtime as needed for anxiety.  30 tablet  3  . aspirin 81 MG tablet Take 81 mg by mouth every other day.      . Calcium Carbonate-Vitamin D (CALCIUM-VITAMIN D3 PO) Take by mouth daily.      [name] ibuprofen (ADVIL,MOTRIN) 200 MG tablet Take 200 mg by mouth every 6 (six) hours as needed.      . Multiple Vitamin (MULTIVITAMIN) tablet Take 1 tablet by mouth daily.      . traMADol (ULTRAM) 50 MG tablet Take 1 tablet (50 mg total) by mouth every 8 (eight) hours as needed for pain.  30 tablet  0    Review of Systems  Constitutional: Negative for fever, chills, diaphoresis and fatigue.  HENT: Negative for congestion.   Respiratory: Negative for cough and shortness of breath.   Cardiovascular: Positive for chest pain. Negative for palpitations and leg swelling.  Gastrointestinal: Negative for abdominal pain.  Musculoskeletal: Positive for myalgias, back pain and arthralgias.  Skin: Negative for color change and rash.      BP 129/85  Pulse 58  Temp(Src) 98.2 F (36.8 C) (Oral)  Resp 14  Wt 171 lb 4 oz (77.678 kg)  SpO2 99%  Objective:   Physical Exam  Constitutional: She is oriented to person, place, and time. She appears well-developed and well-nourished. No distress.  HENT:  Head:  Normocephalic and atraumatic.  Right Ear: External ear normal.  Left Ear: External ear normal.  Nose: Nose normal.  Mouth/Throat: Oropharynx is clear and moist.  Eyes: Conjunctivae are normal. Pupils are equal, round, and reactive to light. Right eye exhibits no discharge. Left eye exhibits no discharge. No scleral icterus.  Neck: Normal range of motion. Neck supple. No tracheal deviation present. No thyromegaly present.  Cardiovascular: Normal rate, regular rhythm, normal heart sounds and intact distal pulses.  Exam reveals no gallop and no friction rub.   No murmur heard. Pulmonary/Chest: Effort normal and breath sounds normal. No accessory muscle usage. Not tachypneic. No respiratory distress. She has no decreased breath sounds. She has no wheezes. She has no rhonchi. She has no rales.   She exhibits no tenderness.  Musculoskeletal: Normal range of motion. She exhibits no edema and no tenderness.  Lymphadenopathy:    She has no cervical adenopathy.  Neurological: She is alert and oriented to person, place, and time. No cranial nerve deficit. She exhibits normal muscle tone. Coordination normal.  Skin: Skin is warm and dry. No rash noted. She is not diaphoretic. No erythema. No pallor.  Psychiatric: She has a normal mood and affect. Her behavior is normal. Judgment and thought content normal.          Assessment & Plan:

## 2012-05-10 NOTE — Assessment & Plan Note (Signed)
Pt with point tenderness right posterior chest wall. Question rib fracture versus vertebral compression fracture with nerve compression. Will get CXR for evaluation. Will continue Ibuprofen 800mg  po tid and add Tramadol for breakthrough pain.  Pt will apply ice to area 10-24min tid. Follow up prn.

## 2012-05-11 ENCOUNTER — Telehealth: Payer: Self-pay | Admitting: Internal Medicine

## 2012-05-11 NOTE — Telephone Encounter (Signed)
[phone] Pt called to get results of her xray done yesterday @ [address]

## 2012-05-11 NOTE — Telephone Encounter (Signed)
Patient notified of results.  Dr. Dan Humphreys released them through MyChart.

## 2012-12-27 HISTORY — PX: BREAST BIOPSY: SHX20

## 2013-01-26 ENCOUNTER — Ambulatory Visit (INDEPENDENT_AMBULATORY_CARE_PROVIDER_SITE_OTHER): Payer: BC Managed Care – PPO | Admitting: Internal Medicine

## 2013-01-26 ENCOUNTER — Encounter: Payer: Self-pay | Admitting: Internal Medicine

## 2013-01-26 VITALS — BP 132/84 | HR 77 | Temp 97.7°F | Resp 16 | Wt 197.5 lb

## 2013-01-26 DIAGNOSIS — M5440 Lumbago with sciatica, unspecified side: Secondary | ICD-10-CM

## 2013-01-26 DIAGNOSIS — M5430 Sciatica, unspecified side: Secondary | ICD-10-CM

## 2013-01-26 MED ORDER — PREDNISONE (PAK) 10 MG PO TABS: ORAL_TABLET | ORAL | Status: DC

## 2013-01-26 MED ORDER — TRAMADOL HCL 50 MG PO TABS: 50.0000 mg | ORAL_TABLET | Freq: Four times a day (QID) | ORAL | Status: DC | PRN

## 2013-01-26 NOTE — Progress Notes (Signed)
Patient ID: Katelyn Friedman, female   DOB: 11-12-1951, 62 y.o.   MRN: [id number]  Patient Active Problem List  Diagnosis  . Arthritis  . Allergic rhinitis  . Herpes simplex  . GERD (gastroesophageal reflux disease)  . History of colon polyps  . Obesity (BMI 30-39.9)  . Insomnia secondary to anxiety  . Right-sided chest wall pain  . Acute back pain with sciatica    Subjective:  CC:   Chief Complaint  Patient presents with  . hip pain    Right side    HPI:   Katelyn Friedman a 62 y.o. female who presents  Past Medical History  Diagnosis Date  . Arthritis   . Allergic rhinitis   . Herpes simplex   . GERD (gastroesophageal reflux disease)   . History of colon polyps     Past Surgical History  Procedure Date  . Rotator cuff repair 2006    Reita Chard   . Knee arthroscopy 1992         The following portions of the patient's history were reviewed and updated as appropriate: Allergies, current medications, and problem list.    Review of Systems:   12 Pt  review of systems was negative except those addressed in the HPI,     History   Social History  . Marital Status: Married    Spouse Name: N/A    Number of Children: N/A  . Years of Education: N/A   Occupational History  . Not on file.   Social History Main Topics  . Smoking status: Never Smoker   . Smokeless tobacco: Not on file  . Alcohol Use: No  . Drug Use: Not on file  . Sexually Active: Not on file   Other Topics Concern  . Not on file   Social History Narrative  . No narrative on file    Objective:  BP 132/84  Pulse 77  Temp 97.7 F (36.5 C) (Oral)  Resp 16  Wt 197 lb 8 oz (89.585 kg)  SpO2 98%  General appearance: alert, cooperative and appears stated age Back: symmetric, no curvature. ROM normal. No CVA tenderness. Lungs: clear to auscultation bilaterally Heart: regular rate and rhythm, S1, S2 normal, no murmur, click, rub or gallop Abdomen: soft, non-tender; bowel  sounds normal; no masses,  no organomegaly Pulses: 2+ and symmetric Skin: Skin color, texture, turgor normal. No rashes or lesions Lymph nodes: Cervical, supraclavicular, and axillary nodes normal. MSK: negative straight leg lift.  DTRs 2+ patella.   Assessment and Plan:  Acute back pain with sciatica Acute, will treat with prednisone taper and pain reliever and if no improvement in 1-2 weeks,  plain films.    Updated Medication List Outpatient Encounter Prescriptions as of 01/26/2013  Medication Sig Dispense Refill  . aspirin 81 MG tablet Take 81 mg by mouth every other day.      . Calcium Carbonate-Vitamin D (CALCIUM-VITAMIN D3 PO) Take by mouth daily.      [name] ibuprofen (ADVIL,MOTRIN) 200 MG tablet Take 200 mg by mouth every 6 (six) hours as needed.      . Multiple Vitamin (MULTIVITAMIN) tablet Take 1 tablet by mouth daily.      . predniSONE (STERAPRED UNI-PAK) 10 MG tablet 6 tablets on Day 1 , then reduce by 1 tablet daily until gone  21 tablet  0  . traMADol (ULTRAM) 50 MG tablet Take 1 tablet (50 mg total) by mouth every 6 (six) hours as needed for pain.  120  tablet  1     No orders of the defined types were placed in this encounter.    No Follow-up on file.

## 2013-01-26 NOTE — Patient Instructions (Addendum)
   prednisone and tramadol for the next 6-7 days.  may add tylenol, heating pad, but no advil or aleve   Walk for 15 minutes every day   Avoid vacuum cleaning and other stooping activities for a week  If no better in one week,  Call to schedule x rays   If you develop sinus congestion  , try Sudafed PE 10 mg every 6 hours as needed   Simply Saline  Nasal flush use twice daily  during flu season to reduce the burden of viruses your nose is carrying, and flush bacteria and moisten passages

## 2013-01-28 ENCOUNTER — Encounter: Payer: Self-pay | Admitting: Internal Medicine

## 2013-01-28 DIAGNOSIS — M5440 Lumbago with sciatica, unspecified side: Secondary | ICD-10-CM | POA: Insufficient documentation

## 2013-01-28 NOTE — Assessment & Plan Note (Signed)
Acute, will treat with prednisone taper and pain reliever and if no improvement in 1-2 weeks,  plain films.

## 2013-02-10 ENCOUNTER — Other Ambulatory Visit: Payer: Self-pay

## 2013-03-30 LAB — LIPID PANEL: Cholesterol: 188 mg/dL (ref 0–200)

## 2013-04-04 LAB — HEPATIC FUNCTION PANEL
ALT: 23 U/L (ref 7–35)
AST: 17 U/L (ref 13–35)
Alkaline Phosphatase: 53 U/L (ref 25–125)
Bilirubin, Total: 0.5 mg/dL

## 2013-04-04 LAB — TSH: TSH: 0.18 u[IU]/mL — AB (ref <=5.90)

## 2013-04-04 LAB — BASIC METABOLIC PANEL
Creatinine: 1 mg/dL (ref <=1.1)
Glucose: 89 mg/dL
Potassium: 4.7 mmol/L (ref 3.4–5.3)

## 2013-04-04 LAB — LIPID PANEL
HDL: 64 mg/dL (ref 35–70)
LDL Cholesterol: 107 mg/dL

## 2013-04-04 LAB — CBC AND DIFFERENTIAL
Hemoglobin: 13.3 g/dL (ref 12.0–16.0)
Platelets: 263 10*3/uL (ref 150–399)
WBC: 6.6 10^3/mL

## 2013-04-04 LAB — HM PAP SMEAR: HM Pap smear: NORMAL

## 2013-04-09 LAB — LIPID PANEL: Triglycerides: 86 mg/dL (ref 40–160)

## 2013-04-24 ENCOUNTER — Ambulatory Visit: Payer: Self-pay | Admitting: Obstetrics and Gynecology

## 2013-04-25 ENCOUNTER — Ambulatory Visit: Payer: Self-pay | Admitting: Obstetrics and Gynecology

## 2013-05-15 ENCOUNTER — Ambulatory Visit: Payer: Self-pay | Admitting: Obstetrics and Gynecology

## 2013-06-04 ENCOUNTER — Ambulatory Visit: Payer: Self-pay | Admitting: Surgery

## 2013-06-04 LAB — HM MAMMOGRAPHY: HM Mammogram: NORMAL

## 2013-06-05 LAB — PATHOLOGY REPORT

## 2013-06-14 ENCOUNTER — Other Ambulatory Visit: Payer: Self-pay | Admitting: Family Medicine

## 2013-06-14 MED ORDER — ACYCLOVIR 400 MG PO TABS: 400.0000 mg | ORAL_TABLET | ORAL | Status: DC

## 2013-07-20 ENCOUNTER — Ambulatory Visit (INDEPENDENT_AMBULATORY_CARE_PROVIDER_SITE_OTHER): Payer: BC Managed Care – HMO | Admitting: Adult Health

## 2013-07-20 ENCOUNTER — Observation Stay: Payer: Self-pay | Admitting: Family Medicine

## 2013-07-20 ENCOUNTER — Encounter: Payer: Self-pay | Admitting: Adult Health

## 2013-07-20 ENCOUNTER — Telehealth: Payer: Self-pay | Admitting: Internal Medicine

## 2013-07-20 VITALS — BP 110/60 | HR 65 | Temp 98.0°F | Resp 12 | Wt 185.0 lb

## 2013-07-20 DIAGNOSIS — R209 Unspecified disturbances of skin sensation: Secondary | ICD-10-CM | POA: Insufficient documentation

## 2013-07-20 DIAGNOSIS — R42 Dizziness and giddiness: Secondary | ICD-10-CM

## 2013-07-20 DIAGNOSIS — I369 Nonrheumatic tricuspid valve disorder, unspecified: Secondary | ICD-10-CM

## 2013-07-20 DIAGNOSIS — R202 Paresthesia of skin: Secondary | ICD-10-CM | POA: Insufficient documentation

## 2013-07-20 DIAGNOSIS — G459 Transient cerebral ischemic attack, unspecified: Secondary | ICD-10-CM | POA: Insufficient documentation

## 2013-07-20 LAB — BASIC METABOLIC PANEL
Anion Gap: 5 — ABNORMAL LOW (ref 7–16)
BUN: 14 mg/dL (ref 7–18)
Calcium, Total: 9.1 mg/dL (ref 8.5–10.1)
Chloride: 109 mmol/L — ABNORMAL HIGH (ref 98–107)
Co2: 28 mmol/L (ref 21–32)
Creatinine: 0.72 mg/dL (ref 0.60–1.30)
EGFR (African American): >60
EGFR (Non-African Amer.): >60
Glucose: 93 mg/dL (ref 65–99)
Osmolality: 283 (ref 275–301)
Potassium: 4.2 mmol/L (ref 3.5–5.1)
Sodium: 142 mmol/L (ref 136–145)

## 2013-07-20 LAB — CBC WITH DIFFERENTIAL/PLATELET
Basophil #: 0 10*3/uL (ref 0.0–0.1)
Basophil %: 0.7 %
Eosinophil #: 0.1 10*3/uL (ref 0.0–0.7)
Eosinophil %: 2.1 %
HCT: 35.8 % (ref 35.0–47.0)
HGB: 12.3 g/dL (ref 12.0–16.0)
Lymphocyte #: 2.2 10*3/uL (ref 1.0–3.6)
Lymphocyte %: 34.3 %
MCH: 28.4 pg (ref 26.0–34.0)
MCHC: 34.4 g/dL (ref 32.0–36.0)
MCV: 83 fL (ref 80–100)
Monocyte #: 0.6 x10 3/mm (ref 0.2–0.9)
Monocyte %: 8.5 %
Neutrophil #: 3.5 10*3/uL (ref 1.4–6.5)
Neutrophil %: 54.4 %
Platelet: 246 10*3/uL (ref 150–440)
RBC: 4.33 10*6/uL (ref 3.80–5.20)
RDW: 15.2 % — ABNORMAL HIGH (ref 11.5–14.5)
WBC: 6.5 10*3/uL (ref 3.6–11.0)

## 2013-07-20 LAB — T4, FREE: Free Thyroxine: 1.64 ng/dL — ABNORMAL HIGH (ref 0.76–1.46)

## 2013-07-20 LAB — HEPATIC FUNCTION PANEL A (ARMC)
Albumin: 3.5 g/dL (ref 3.4–5.0)
Alkaline Phosphatase: 64 U/L (ref 50–136)
Bilirubin, Direct: <0.1 mg/dL (ref 0.00–0.20)
Bilirubin,Total: 0.3 mg/dL (ref 0.2–1.0)
SGOT(AST): 23 U/L (ref 15–37)
SGPT (ALT): 30 U/L (ref 12–78)
Total Protein: 6.9 g/dL (ref 6.4–8.2)

## 2013-07-20 LAB — TSH: Thyroid Stimulating Horm: <0.01 u[IU]/mL — ABNORMAL LOW

## 2013-07-20 LAB — TROPONIN I: Troponin-I: <0.02 ng/mL

## 2013-07-20 LAB — MAGNESIUM: Magnesium: 1.8 mg/dL

## 2013-07-20 NOTE — Assessment & Plan Note (Signed)
Neuro exam normal. Left arm weakness and patient's report of feeling foggy and off. EKG shows inverted T waves in leads V1, V2, V3. Pt was sent to the ED for further w/u. Becky called ED and spoke with triage nurse, Tammy Sours, to report patient being sent.

## 2013-07-20 NOTE — Progress Notes (Signed)
  Subjective:    Patient ID: Katelyn Friedman, female    DOB: [birth year], 62 y.o.   MRN: [id number]  HPI  Patient is a pleasant 62 year old female who presents to clinic with the following symptoms: Face felt swollen, eyes felt tight, lips tingling when she woke up this morning "kind of dizzy" feels "in a fog". Blurred vision. Left arm weakness, inability to raise arm over her head. Arm is also trembling and shaky. She reports "I just don't feel right". She denies shortness of breath currently, however, she reports being short of breath approximately 2 weeks ago.   Current Outpatient Prescriptions on File Prior to Visit  Medication Sig Dispense Refill  . acyclovir (ZOVIRAX) 400 MG tablet Take 1 tablet (400 mg total) by mouth every 4 (four) hours while awake.  180 tablet  2  . aspirin 81 MG tablet Take 81 mg by mouth every other day.      . Calcium Carbonate-Vitamin D (CALCIUM-VITAMIN D3 PO) Take by mouth daily.      [name] ibuprofen (ADVIL,MOTRIN) 200 MG tablet Take 200 mg by mouth every 6 (six) hours as needed.      . Multiple Vitamin (MULTIVITAMIN) tablet Take 1 tablet by mouth daily.       No current facility-administered medications on file prior to visit.    Review of Systems  Constitutional: Positive for fatigue.  HENT: Negative.   Eyes:       Blurred vision  Respiratory: Negative for chest tightness and shortness of breath.   Cardiovascular: Negative for chest pain.  Gastrointestinal: Negative.   Genitourinary: Negative.   Musculoskeletal:       Right arm weakness, tingling  Neurological: Positive for dizziness, weakness and light-headedness.  Psychiatric/Behavioral: The patient is nervous/anxious.   All other systems reviewed and are negative.    BP 110/60  Pulse 65  Temp(Src) 98 F (36.7 C) (Oral)  Resp 12  Wt 185 lb (83.915 kg)  BMI 33.83 kg/m2  SpO2 99%    Objective:   Physical Exam  Constitutional: She is oriented to person, place, and time.  Overweight,  female. No distress noted.  Cardiovascular: Normal rate, regular rhythm and normal heart sounds.  Exam reveals no gallop.   No murmur heard. EKG abnormal. Inverted T waves in precordial leads and lateral leads  Pulmonary/Chest: Effort normal and breath sounds normal.  Musculoskeletal:  Left arm shaky with flexion  Lymphadenopathy:    She has no cervical adenopathy.  Neurological: She is alert and oriented to person, place, and time.  Psychiatric: She has a normal mood and affect. Her behavior is normal. Judgment and thought content normal.      Assessment & Plan:

## 2013-07-20 NOTE — Telephone Encounter (Signed)
Pt has appointment with Raquel today.

## 2013-07-20 NOTE — Telephone Encounter (Signed)
Patient call regarding tingling in lips, dizziness, and facial swelling.  Attempted call back at 09:52.  No answer.  Left message.

## 2013-07-20 NOTE — Patient Instructions (Signed)
  Pt's husband driving her to the ED for evaluation and w/u of abnormal EKG

## 2013-07-21 ENCOUNTER — Telehealth: Payer: Self-pay | Admitting: Internal Medicine

## 2013-07-21 DIAGNOSIS — E041 Nontoxic single thyroid nodule: Secondary | ICD-10-CM

## 2013-07-21 DIAGNOSIS — E0590 Thyrotoxicosis, unspecified without thyrotoxic crisis or storm: Secondary | ICD-10-CM

## 2013-07-21 LAB — BASIC METABOLIC PANEL
Anion Gap: 9 (ref 7–16)
BUN: 18 mg/dL (ref 7–18)
Calcium, Total: 9.1 mg/dL (ref 8.5–10.1)
Chloride: 107 mmol/L (ref 98–107)
Co2: 25 mmol/L (ref 21–32)
Creatinine: 0.92 mg/dL (ref 0.60–1.30)
EGFR (African American): >60
EGFR (Non-African Amer.): >60
Glucose: 102 mg/dL — ABNORMAL HIGH (ref 65–99)
Osmolality: 283 (ref 275–301)
Potassium: 4.1 mmol/L (ref 3.5–5.1)
Sodium: 141 mmol/L (ref 136–145)

## 2013-07-21 LAB — LIPID PANEL
Cholesterol: 184 mg/dL (ref 0–200)
HDL Cholesterol: 54 mg/dL (ref 40–60)
Ldl Cholesterol, Calc: 107 mg/dL — ABNORMAL HIGH (ref 0–100)
Triglycerides: 113 mg/dL (ref 0–200)
VLDL Cholesterol, Calc: 23 mg/dL (ref 5–40)

## 2013-07-21 NOTE — Telephone Encounter (Signed)
Amber,  Patient was discharged from [facility] on Saturday,  Needs endocrine consult for thyroid nodule,  Hyperthyroidism.  [place] preferred

## 2013-07-23 ENCOUNTER — Telehealth: Payer: Self-pay | Admitting: Internal Medicine

## 2013-07-23 NOTE — Telephone Encounter (Signed)
I do not want her seeing Moryati,.  I Have already placed order for endocrine referral to [facility].[name]

## 2013-07-23 NOTE — Telephone Encounter (Signed)
Pt was seen Friday by Raquel, sent to ER.  Pt has been recommended to f/u with an endocrinologist, Dr. Patrecia Pace.  Pt was unable to schedule this appt herself and was told the referral would have to come from her PCP.  Pt has hospital f/u scheduled 8/7 with Dr. Darrick Huntsman.  Pt has her records of thyroid U/S, chest CT, etc.  Can Endocrine appt be scheduled sooner than her f/u on 8/7?  She can drop off the records from the hospital visit if needed.

## 2013-07-23 NOTE — Telephone Encounter (Signed)
Pt wanted to thank Raquel personally for taking such good care of her on Friday!  Asks if you could give her a call.

## 2013-07-24 ENCOUNTER — Telehealth: Payer: Self-pay | Admitting: Internal Medicine

## 2013-07-24 DIAGNOSIS — E0590 Thyrotoxicosis, unspecified without thyrotoxic crisis or storm: Secondary | ICD-10-CM

## 2013-07-24 NOTE — Telephone Encounter (Signed)
I spoke with the patient yesterday about her the options for where she can go and offered her the [place] apt date/time. She refused.

## 2013-07-24 NOTE — Telephone Encounter (Signed)
Amber can you see if [facility] any earlier [facility]ts than 10/1 in  ?  thanks

## 2013-07-24 NOTE — Telephone Encounter (Signed)
That's ok. She can't have the thyroid uptake scan for at least 6 weeks after her CT scan because the iodine contrast that was given in the hospital skew the test results,.  I will order the thyroid uptake scan to be done at [facility] cone so Dr Lafe Garin will have access to it on her appt.  She needs to come back here in 6 weeks for repeat thyroid testing on the current methimazole dose so that would be around the second week of September.   Please have her follow up with me sooner if she is not feeling well.

## 2013-07-24 NOTE — Telephone Encounter (Signed)
Talked with patient this morning she stated she has an appointment with [facility] on 09/26/13 See phone note dated 07/23/13.

## 2013-07-24 NOTE — Telephone Encounter (Signed)
Patient notified. Patient has an appointment wit [facility]in[facility] 09/26/13. FYI

## 2013-07-24 NOTE — Telephone Encounter (Signed)
Patient has in hospital last week on 7/25. Patient was referred to Dr. Patrecia Pace office for hypothyroidism and they are calling for [place] to make a referral. Please advise.     Fax [phone]

## 2013-07-25 NOTE — Telephone Encounter (Signed)
Patient has a Hospital follow up scheduled with you 08/02/13 leave this appointment and schedule lab for September?

## 2013-07-25 NOTE — Telephone Encounter (Signed)
Yes keep the appt for hospital f u and schedule the labs

## 2013-07-28 ENCOUNTER — Encounter: Payer: Self-pay | Admitting: *Deleted

## 2013-07-28 NOTE — Telephone Encounter (Signed)
Sent mychart message

## 2013-07-31 ENCOUNTER — Other Ambulatory Visit: Payer: Self-pay | Admitting: Internal Medicine

## 2013-07-31 ENCOUNTER — Encounter: Payer: Self-pay | Admitting: *Deleted

## 2013-07-31 ENCOUNTER — Telehealth: Payer: Self-pay | Admitting: Internal Medicine

## 2013-07-31 NOTE — Telephone Encounter (Signed)
Patient ntoified of no labs tomorrow she will be in for appointment on 08/02/13

## 2013-07-31 NOTE — Telephone Encounter (Signed)
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  The labs are due in 6 weeks,  The hospital started her on medication to suppress her thyroid and the labs have to be repeated in 6 week s

## 2013-08-02 ENCOUNTER — Ambulatory Visit (INDEPENDENT_AMBULATORY_CARE_PROVIDER_SITE_OTHER): Payer: BC Managed Care – HMO | Admitting: Internal Medicine

## 2013-08-02 ENCOUNTER — Encounter: Payer: Self-pay | Admitting: Internal Medicine

## 2013-08-02 VITALS — BP 128/78 | HR 44 | Temp 98.0°F | Resp 12 | Wt 186.5 lb

## 2013-08-02 DIAGNOSIS — E559 Vitamin D deficiency, unspecified: Secondary | ICD-10-CM

## 2013-08-02 DIAGNOSIS — E0590 Thyrotoxicosis, unspecified without thyrotoxic crisis or storm: Secondary | ICD-10-CM

## 2013-08-02 DIAGNOSIS — G459 Transient cerebral ischemic attack, unspecified: Secondary | ICD-10-CM

## 2013-08-02 DIAGNOSIS — Z79899 Other long term (current) drug therapy: Secondary | ICD-10-CM

## 2013-08-02 DIAGNOSIS — R920 Mammographic microcalcification found on diagnostic imaging of breast: Secondary | ICD-10-CM

## 2013-08-02 MED ORDER — FUROSEMIDE 20 MG PO TABS: 20.0000 mg | ORAL_TABLET | Freq: Every day | ORAL | Status: DC

## 2013-08-02 MED ORDER — METHIMAZOLE 5 MG PO TABS: 5.0000 mg | ORAL_TABLET | Freq: Three times a day (TID) | ORAL | Status: DC

## 2013-08-02 NOTE — Progress Notes (Addendum)
Patient ID: Katelyn Friedman, female   DOB: [birth year], 62 y.o.   MRN: [id number]   Patient Active Problem List   Diagnosis Date Noted  . Hyperthyroidism 08/04/2013  . Abnormal mammogram with microcalcification 08/04/2013  . Disturbance of skin sensation 07/20/2013  . Transient cerebral ischemia 07/20/2013  . Acute back pain with sciatica 01/28/2013  . Obesity (BMI 30-39.9) 05/01/2012  . 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 Arthritis   . Allergic rhinitis   . Herpes simplex   . GERD (gastroesophageal reflux disease)   . History of colon polyps     Subjective:  CC:   Chief Complaint  Patient presents with  . Follow-up    from hospital    HPI:   Katelyn Friedman a 62 y.o. female who presents for follow up on recent admission to [facility] under 24 hr observation  for  signs and symptoms of TIA vs new onset hyperthyroidism. Patient was seen by Imogene Burn on June 25 with new onset left-sided facial numbness  which was present when she awoke that morning and a history of palpitaitions. Her numbness was accompanied  by feeling of fatigue and shortness of breath. Her EKG had T-wave inversions in the anterior leads and she was referred to the ER. By the time she was admitted to rule out TIA, symptoms were improving and her neurologic exam was normal. . Chest CT with angiography was done to rule out PE and CT of the head was done without contrast and repeated prior to discharge with no evidence of stroke. MRI was contraindicated due to placement of a metal bead  In right breast during recent breast biopsy June 14 for benign calcifications. EKG and echocardiogram were done. EKG was abnormal but cardiac enzymes were negative. Echocardiogram was normal except for mild left atrial enlargement mild elevated pulmonary artery pressures and mild tricuspid regurgitation. Thyroid ultrasound  suggested a 7 mm nodule in the left lobe  Patient's TSH was suppressed to less than 0.01 T3 was elevated at 215.   Anti thyroglobulin antibodies were elevated at 507 and TPO antibodies were elevated at 839. She was started on methimazole 5 mg 3 times a day and discharged home the following day.   For the past weak she reports compliance with meds.  She reports recurrent infrequent palpitations, and has repeatedly checked he rpulse which has been regular although higher than her usual resting rate of 59.  The numbness and tingling gone, but states that her left arm feels weak.  She remained out of work last week but feels ready to return.   Past Medical History  Diagnosis Date  . Arthritis   . Allergic rhinitis   . Herpes simplex   . GERD (gastroesophageal reflux disease)   . History of colon polyps     Past Surgical History  Procedure Laterality Date  . Rotator cuff repair  2006    Reita Chard   . Knee arthroscopy  1992       The following portions of the patient's history were reviewed and updated as appropriate: Allergies, current medications, and problem list.    Review of Systems:   Patient denies headache, fevers, malaise, unintentional weight loss, skin rash, eye pain, sinus congestion and sinus pain, sore throat, dysphagia,  hemoptysis , cough, dyspnea, wheezing, chest pain,  orthopnea, edema, abdominal pain, nausea, melena, diarrhea, constipation, flank pain, dysuria, hematuria, urinary  Frequency, nocturia, numbness, tingling, seizures, , Loss of consciousness,  Tremor,  depression, anxiety, and suicidal ideation.  History   Social History  . Marital Status: Married    Spouse Name: N/A    Number of Children: N/A  . Years of Education: N/A   Occupational History  . Not on file.   Social History Main Topics  . Smoking status: Never Smoker   . Smokeless tobacco: Not on file  . Alcohol Use: No  . Drug Use: Not on file  . Sexually Active: Not on file   Other Topics Concern  . Not on file   Social History Narrative  . No narrative on file    Objective:  Filed  Vitals:   08/02/13 1058  BP: 128/78  Pulse: 44  Temp: 98 F (36.7 C)  Resp: 12     General appearance: alert, cooperative and appears stated age Ears: normal TM's and external ear canals both ears Throat: lips, mucosa, and tongue normal; teeth and gums normal Neck: no adenopathy, no carotid bruit, supple, symmetrical, trachea midline and thyroid not enlarged, symmetric, no tenderness/mass/nodules Back: symmetric, no curvature. ROM normal. No CVA tenderness. Lungs: clear to auscultation bilaterally Heart: regular rate and rhythm, S1, S2 normal, no murmur, click, rub or gallop Abdomen: soft, non-tender; bowel sounds normal; no masses,  no organomegaly Pulses: 2+ and symmetric Skin: Skin color, texture, turgor normal. No rashes or lesions Lymph nodes: Cervical, supraclavicular, and axillary nodes normal. Neuro: grossly nonfocal exam.   Assessment and Plan: Transient cerebral ischemia I do not think her symptoms were ischemic in origin.  She has no risk factors .  Her neurologic exam has always been normal and she has an excellent lipid panel .  Id did not pursue carotid ultrasound or change her use of aspirin to more than QOD  Hyperthyroidism With possible overactive nodule suggested by ultrasound.  She has produced copies of labs done by her gynecologist in April 2014 which suggested she was hyperthyroid at that time. TSH was 0.184 and These have been entered into Epic for historicla accuracy.  We will recheck her thyroid levels in mid September after 6 weeks of compliance with methimazole 5 mg 3 times daily. Radioactive iodide uptake scan has been postponed for 6 weeks due to recent use of iodinated contrast for chest CT. She has been referred to Dr. Inda Castle and should be seeing her after both results are available.  Abnormal mammogram with microcalcification She underwent stereotactic right breast biopsy on June 14 for evaluation of suspicious microcalcifications. Biopsy was  benign. A metallic been has been placed in her breast which contraindicates any use of MRIs.  Vitamin D deficiency The patient is taking patient is taking 50,000 units of vitamin D 3 weekly for low vitamin D level of 10 back in April. She will need a repeat vitamin D level with next lab draw .  A total of 40 minutes was spent with patient more than half of which was spent in counseling, reviewing records from other prviders and coordination of care.   Updated Medication List Outpatient Encounter Prescriptions as of 08/02/2013  Medication Sig Dispense Refill  . acyclovir (ZOVIRAX) 400 MG tablet Take 1 tablet (400 mg total) by mouth every 4 (four) hours while awake.  180 tablet  2  . aspirin 81 MG tablet Take 81 mg by mouth every other day.      . Calcium Carbonate-Vitamin D (CALCIUM-VITAMIN D3 PO) Take by mouth daily.      . ergocalciferol (VITAMIN D2) 50000 UNITS capsule Take 50,000 Units by mouth once a week.      [name]  ibuprofen (ADVIL,MOTRIN) 200 MG tablet Take 200 mg by mouth every 6 (six) hours as needed.      . methimazole (TAPAZOLE) 5 MG tablet Take 1 tablet (5 mg total) by mouth 3 (three) times daily.  90 tablet  2  . Multiple Vitamin (MULTIVITAMIN) tablet Take 1 tablet by mouth daily.      . [DISCONTINUED] methimazole (TAPAZOLE) 5 MG tablet Take 5 mg by mouth 3 (three) times daily.      . furosemide (LASIX) 20 MG tablet Take 1 tablet (20 mg total) by mouth daily.  30 tablet  3   No facility-administered encounter medications on file as of 08/02/2013.

## 2013-08-02 NOTE — Patient Instructions (Addendum)
Return for repeat thyroid  Levels in mid September   Finish your current bottle of Drisdol,  ,  Then begin daily OTC 1000 IUs of Vit D 3 after one week  And we wil lrececk in 6 weeks with thyroid   I am adding a low dose of furosemide , a diuretic,  Try take in the morning as needed for fluid retention .  IF YOU decide to take it daily.  We will put you on a potassium supplement, so let me know

## 2013-08-04 ENCOUNTER — Encounter: Payer: Self-pay | Admitting: Internal Medicine

## 2013-08-04 DIAGNOSIS — E559 Vitamin D deficiency, unspecified: Secondary | ICD-10-CM | POA: Insufficient documentation

## 2013-08-04 DIAGNOSIS — R920 Mammographic microcalcification found on diagnostic imaging of breast: Secondary | ICD-10-CM | POA: Insufficient documentation

## 2013-08-04 NOTE — Assessment & Plan Note (Addendum)
With possible overactive nodule suggested by ultrasound.  She has produced copies of labs done by her gynecologist in April 2014 which suggested she was hyperthyroid at that time. TSH was 0.184 and These have been entered into Epic for historicla accuracy.  We will recheck her thyroid levels in mid September after 6 weeks of compliance with methimazole 5 mg 3 times daily. Radioactive iodide uptake scan has been postponed for 6 weeks due to recent use of iodinated contrast for chest CT. She has been referred to Dr. Inda Castle and should be seeing her after both results are available.

## 2013-08-04 NOTE — Assessment & Plan Note (Signed)
The patient is taking patient is taking 50,000 units of vitamin D 3 weekly for low vitamin D level of 10 back in April. She will need a repeat vitamin D level with next lab draw .

## 2013-08-04 NOTE — Assessment & Plan Note (Signed)
She underwent stereotactic right breast biopsy on June 14 for evaluation of suspicious microcalcifications. Biopsy was benign. A metallic been has been placed in her breast which contraindicates any use of MRIs.

## 2013-08-04 NOTE — Addendum Note (Signed)
Addended by: Sherlene Shams on: 08/04/2013 01:52 PM   Modules accepted: Orders

## 2013-08-04 NOTE — Assessment & Plan Note (Addendum)
I do not think her symptoms were ischemic in origin.  She has no risk factors .  Her neurologic exam has always been normal and she has an excellent lipid panel .  Id did not pursue carotid ultrasound or change her use of aspirin to more than QOD

## 2013-08-23 ENCOUNTER — Telehealth: Payer: Self-pay | Admitting: *Deleted

## 2013-08-23 NOTE — Telephone Encounter (Signed)
Patient stated she has had a slight rash appear after starting the thyroid medication, methimazole 5 mg 3 times daily. 08/01/13 and ask if she should continue or can she take anything for the rash patient cannot take diphenhydramine . Please advise.

## 2013-08-23 NOTE — Telephone Encounter (Signed)
Could not reach patient left message to return call.

## 2013-08-23 NOTE — Telephone Encounter (Signed)
Most rashes with methizmazole are not serious and do not require discontinuation of medication , but I cannot determine that from a phone call .  Can she drop by this afternoon so I can take a look at it?

## 2013-09-10 ENCOUNTER — Encounter (HOSPITAL_COMMUNITY)
Admission: RE | Admit: 2013-09-10 | Discharge: 2013-09-10 | Disposition: A | Discharge status: Home / Self Care | Payer: BC Managed Care – HMO | Source: Ambulatory Visit | Attending: Internal Medicine | Admitting: Internal Medicine

## 2013-09-10 DIAGNOSIS — E0590 Thyrotoxicosis, unspecified without thyrotoxic crisis or storm: Secondary | ICD-10-CM

## 2013-09-11 ENCOUNTER — Encounter (HOSPITAL_COMMUNITY): Payer: BC Managed Care – HMO

## 2013-09-11 ENCOUNTER — Telehealth: Payer: Self-pay | Admitting: *Deleted

## 2013-09-11 NOTE — Telephone Encounter (Signed)
I was not aware of this either. The radiology dept should have told her this. Have them reschedule it.

## 2013-09-11 NOTE — Telephone Encounter (Signed)
Patient called and stated she went for thyroid scan today, but could not be performed due to she did not know she was to stop medication five days prior to scan . Please advise, patient upset because she was not able to get scan.

## 2013-09-12 NOTE — Telephone Encounter (Signed)
Left message for patient to return my call.

## 2013-09-13 ENCOUNTER — Other Ambulatory Visit: Payer: BC Managed Care – HMO

## 2013-09-24 ENCOUNTER — Encounter (HOSPITAL_COMMUNITY)
Admission: RE | Admit: 2013-09-24 | Discharge: 2013-09-24 | Disposition: A | Discharge status: Home / Self Care | Payer: BC Managed Care – HMO | Source: Ambulatory Visit | Attending: Internal Medicine | Admitting: Internal Medicine

## 2013-09-24 DIAGNOSIS — E0590 Thyrotoxicosis, unspecified without thyrotoxic crisis or storm: Secondary | ICD-10-CM | POA: Insufficient documentation

## 2013-09-25 ENCOUNTER — Encounter (HOSPITAL_COMMUNITY)
Admission: RE | Admit: 2013-09-25 | Discharge: 2013-09-25 | Disposition: A | Discharge status: Home / Self Care | Payer: BC Managed Care – HMO | Source: Ambulatory Visit | Attending: Internal Medicine | Admitting: Internal Medicine

## 2013-09-25 DIAGNOSIS — E0500 Thyrotoxicosis with diffuse goiter without thyrotoxic crisis or storm: Secondary | ICD-10-CM | POA: Insufficient documentation

## 2013-09-25 MED ORDER — SODIUM IODIDE I 131 CAPSULE
9.5000 | Freq: Once | INTRAVENOUS | Status: AC | PRN
  Administered 2013-09-25: 9.5 via ORAL

## 2013-09-25 MED ORDER — SODIUM PERTECHNETATE TC 99M INJECTION
10.6000 | Freq: Once | INTRAVENOUS | Status: AC | PRN
  Administered 2013-09-25: 11 via INTRAVENOUS

## 2013-09-26 ENCOUNTER — Encounter: Payer: Self-pay | Admitting: Internal Medicine

## 2013-09-26 ENCOUNTER — Ambulatory Visit (INDEPENDENT_AMBULATORY_CARE_PROVIDER_SITE_OTHER): Payer: BC Managed Care – HMO | Admitting: Internal Medicine

## 2013-09-26 VITALS — BP 128/72 | HR 73 | Temp 97.9°F | Resp 10 | Ht 62.5 in | Wt 192.0 lb

## 2013-09-26 DIAGNOSIS — E0500 Thyrotoxicosis with diffuse goiter without thyrotoxic crisis or storm: Secondary | ICD-10-CM

## 2013-09-26 NOTE — Patient Instructions (Addendum)
Please stop at the lab. I will send you results through MyChart. We will decide for the treatment plan depending on the results. Please return in ~2.5 months for another visit, but will likely need you to come in 4-6 weeks for just a lab appointment. Please stop the Methimazole (Tapazole) and call us or your primary care doctor if you develop: - sore throat - fever - yellow skin - dark urine - light colored stools As we will then need to check your blood counts and liver tests.

## 2013-09-26 NOTE — Progress Notes (Signed)
Patient ID: Katelyn Friedman, female   DOB: 08-09-1951, 62 y.o.   MRN: [id number]   HPI  Katelyn Friedman is a 62 y.o.-year-old female, referred by her PCP, Dr. Darrick Huntsman for evaluation for mangement of Graves ds, recently diagnosed.  Pt was first found to have a low TSH in 03/2013 by ObGyn. At that time, it was decided to just observe and repeat labs in the future. In 05/2013 >> started to have palpitations/CP/left side arm tingling >> saw Dr Darrick Huntsman >> sent to the hospital. Thyrotoxicosis was confirmed when a repeat TSH, returned at <0.010, fT4 1.64 (0.76-1.46), TT3 215 (71-180) on 07/20/2013. After these results, the pt was started on Methimazole 5 mg tid, which she continues today. She tolerates it well.  I reviewed the available thyroid tests in EPIC: Lab Results  Component Value Date   TSH 0.18* 04/04/2013   TSH 1.385 05/06/2010   TSH 2.774 04/17/2009     A thyroid uptake and scan was ordered and this was performed on 09/25/2013: c/w Graves ds: uniform uptake, elevated, at 49% (15-10%). No thyroid nodules.   Of note, she also had a thyroid U/S in 07/20/2013: size, shape and echotexture of the thyroid gland are normal. ? 7 mm nodule in L lobe.  Pt denies feeling nodules in neck, hoarseness, dysphagia/odynophagia, SOB with lying down, occasional "tickle in throat";  Before her dx,  she c/o: - feeling "hyper" all the time >> improved - + palpitations >> only occasional now - + heart racing >> only occasional now - continues to feel sluggish - no excessive sweating/heat intolerance - + had tremors, now resolved - + anxiety - especially after losing her father < 1 mo ago - + problems with concentration - + fatigue, usually 3-4 pm >> a little better - + constipation >> not new - lost 10 lbs with Graves ds before starting MMI, now gained 8 lbs back in last 2 mo - + occasional shooting pain in eyes in the last year. Last eye exam was a year ago >> given drops for dry eyes.  Pt does have a FH of  thyroid ds in sister (hypothyroidism) . No FH of thyroid cancer. No h/o radiation tx to head or neck.  No seaweed or kelp, no recent contrast studies. No steroid use. No herbal supplements.   I reviewed her chart and she also has a history of  GERD, OA, h/o TIA, HL, anxiety, back pain, vit D def. I reviewed lab results from 07/20/2013. She also had a normal CBC with diff, and normal BUN/Cr: 18/0.92.  ROS: Constitutional: see HPI Eyes: no blurry vision, + xerophthalmia, + occasional shooting pain in eyes, + nocturia ENT: no sore throat, no nodules palpated in throat, no dysphagia/odynophagia, no hoarseness Cardiovascular: no CP/SOB/+ palpitations/+ leg swelling Respiratory: no cough/SOB Gastrointestinal: no N/V/D/+ C, + heartburn Musculoskeletal:+ all: muscle/joint aches Skin: no rashes Neurological: no tremors/numbness/tingling/dizziness, + HA Psychiatric: no depression/anxiety Low libido  Past Medical History  Diagnosis Date  . Arthritis   . Allergic rhinitis   . Herpes simplex   . GERD (gastroesophageal reflux disease)   . History of colon polyps    Past Surgical History  Procedure Laterality Date  . Rotator cuff repair  2006    Reita Chard   . Knee arthroscopy  1992   History   Social History  . Marital Status: Married    Spouse Name: N/A    Number of Children: 1   Occupational History  . admin  Social History Main Topics  . Smoking status: Never Smoker   . Smokeless tobacco: Never Used  . Alcohol Use: No  . Drug Use: No  . Sexual Activity: Yes    Partners: Male   Social History Narrative   Married: 19 yrs   Regular exercise: walk   Caffeine use: none   Current Outpatient Prescriptions on File Prior to Visit  Medication Sig Dispense Refill  . acyclovir (ZOVIRAX) 400 MG tablet Take 1 tablet (400 mg total) by mouth every 4 (four) hours while awake.  180 tablet  2  . aspirin 81 MG tablet Take 81 mg by mouth every other day.      . Calcium Carbonate-Vitamin  D (CALCIUM-VITAMIN D3 PO) Take by mouth daily.      . furosemide (LASIX) 20 MG tablet Take 1 tablet (20 mg total) by mouth daily.  30 tablet  3  . ibuprofen (ADVIL,MOTRIN) 200 MG tablet Take 200 mg by mouth every 6 (six) hours as needed.      . methimazole (TAPAZOLE) 5 MG tablet Take 1 tablet (5 mg total) by mouth 3 (three) times daily.  90 tablet  2  . Multiple Vitamin (MULTIVITAMIN) tablet Take 1 tablet by mouth daily.      . ergocalciferol (VITAMIN D2) 50000 UNITS capsule Take 50,000 Units by mouth once a week.       No current facility-administered medications on file prior to visit.   Allergies  Allergen Reactions  . Codeine Nausea Only   Family History  Problem Relation Age of Onset  . Stroke Mother   . Dementia Father    PE: BP 128/72  Pulse 73  Temp(Src) 97.9 F (36.6 C) (Oral)  Resp 10  Ht 5' 2.5" (1.588 m)  Wt 192 lb (87.091 kg)  BMI 34.54 kg/m2  SpO2 97% Wt Readings from Last 3 Encounters:  08/02/13 186 lb 8 oz (84.596 kg)  07/20/13 185 lb (83.915 kg)  01/26/13 197 lb 8 oz (89.585 kg)   Constitutional: overweight, in NAD Eyes: PERRLA, EOMI, no exophthalmos, no lid lag, no stare ENT: moist mucous membranes, no thyromegaly, no thyroid bruits, no cervical lymphadenopathy Cardiovascular: RRR, No MRG Respiratory: CTA B Gastrointestinal: abdomen soft, NT, ND, BS+ Musculoskeletal: no deformities, strength intact in all 4 Skin: moist, warm, no rashes Neurological: no tremor with outstretched hands, DTR normal in all 4  ASSESSMENT: 1. Graves ds  PLAN:  1. Patient with recently diagnosed Graves ds. Sxs much better after starting MMI 2 mo ago. - I suggested that we recheck the TSH, fT3 and fT4 and also, to add thyroid stimulating antibodies as she c/o occasional shooting pain in her eyes and will need to schedule a new appt with her eye dr (he will need TSI titer to better evaluate for Graves Ophthalmopathy)  - we discussed about possible modalities of treatment for  the above conditions, to include methimazole use, radioactive iodine ablation or surgery (I would not recommend the latter in her). We will decide for either continuing the MMI or RAI tx depending on the results.  - I advised her to stop the Methimazole (Tapazole) and call us or the primary care doctor if develops a sore throat, fever or signs of liver dysfunction. as we will then need to check CBC and liver tests. - I do not feel that we need to add beta blockers at this time, since she is not tachycardic, anxious, or tremulous - I will send her results through mYChart -  RTC in 2.5 months, but likely sooner for repeat labs  Orders Placed This Encounter  Procedures  . TSH  . T3, free  . T4, free  . Thyroid Stimulating Immunoglobulin   Office Visit on 09/26/2013  Component Date Value Range Status  . TSI 09/26/2013 338* <140 % baseline Final  . TSH 09/26/2013 1.30  0.35 - 5.50 uIU/mL Final  . Free T4 09/26/2013 0.61  0.60 - 1.60 ng/dL Final  . T3, Free 16/09/9603 2.7  2.3 - 4.2 pg/mL Final   Much improved TFTs (normal now) >> decrease MMI to 5 mg in am and RTC in 4 weeks for labs. If tests still normal then >> stop MMI.

## 2013-09-27 LAB — T4, FREE: Free T4: 0.61 ng/dL (ref 0.60–1.60)

## 2013-09-27 LAB — TSH: TSH: 1.3 u[IU]/mL (ref 0.35–5.50)

## 2013-09-28 ENCOUNTER — Other Ambulatory Visit: Payer: Self-pay | Admitting: Internal Medicine

## 2013-09-28 DIAGNOSIS — E0500 Thyrotoxicosis with diffuse goiter without thyrotoxic crisis or storm: Secondary | ICD-10-CM

## 2013-09-28 LAB — T3, FREE: T3, Free: 2.7 pg/mL (ref 2.3–4.2)

## 2013-09-28 LAB — THYROID STIMULATING IMMUNOGLOBULIN: TSI: 338 % baseline — ABNORMAL HIGH (ref <140)

## 2013-10-02 ENCOUNTER — Other Ambulatory Visit (INDEPENDENT_AMBULATORY_CARE_PROVIDER_SITE_OTHER): Payer: BC Managed Care – HMO

## 2013-10-02 DIAGNOSIS — E559 Vitamin D deficiency, unspecified: Secondary | ICD-10-CM

## 2013-10-02 DIAGNOSIS — Z79899 Other long term (current) drug therapy: Secondary | ICD-10-CM

## 2013-10-02 LAB — COMPREHENSIVE METABOLIC PANEL
ALT: 13 U/L (ref 0–35)
AST: 18 U/L (ref 0–37)
Albumin: 4.1 g/dL (ref 3.5–5.2)
Alkaline Phosphatase: 64 U/L (ref 39–117)
BUN: 15 mg/dL (ref 6–23)
CO2: 28 mEq/L (ref 19–32)
Calcium: 9.4 mg/dL (ref 8.4–10.5)
Chloride: 103 mEq/L (ref 96–112)
Creatinine, Ser: 1 mg/dL (ref 0.4–1.2)
GFR: 71.96 mL/min (ref >60.00)
Glucose, Bld: 85 mg/dL (ref 70–99)
Potassium: 4.3 mEq/L (ref 3.5–5.1)
Sodium: 138 mEq/L (ref 135–145)
Total Bilirubin: 0.7 mg/dL (ref 0.3–1.2)
Total Protein: 7.1 g/dL (ref 6.0–8.3)

## 2013-10-03 LAB — VITAMIN D 25 HYDROXY (VIT D DEFICIENCY, FRACTURES): Vit D, 25-Hydroxy: 38 ng/mL (ref 30–89)

## 2013-10-04 ENCOUNTER — Encounter: Payer: Self-pay | Admitting: Internal Medicine

## 2013-10-15 ENCOUNTER — Ambulatory Visit: Payer: BC Managed Care – HMO

## 2013-11-01 ENCOUNTER — Other Ambulatory Visit: Payer: Self-pay

## 2013-11-07 ENCOUNTER — Telehealth: Payer: Self-pay | Admitting: *Deleted

## 2013-11-07 NOTE — Telephone Encounter (Signed)
Carollee Herter, please advise her to stop MMI and come for labs (it is time for the TFTs anyway) - but can you please add a CBC to the TFTs already ordered? Thank you.

## 2013-11-07 NOTE — Telephone Encounter (Signed)
Pt called and lvm stating she has had a sore throat for a couple of days. Please advise.

## 2013-11-08 ENCOUNTER — Other Ambulatory Visit: Payer: Self-pay | Admitting: *Deleted

## 2013-11-08 DIAGNOSIS — E0500 Thyrotoxicosis with diffuse goiter without thyrotoxic crisis or storm: Secondary | ICD-10-CM

## 2013-11-08 NOTE — Telephone Encounter (Signed)
Called pt advised her to stop her MMI and to come in for labs. Pt will stop and pt will com in on Friday morning for labs.

## 2013-11-09 ENCOUNTER — Other Ambulatory Visit: Payer: BC Managed Care – HMO

## 2013-11-09 ENCOUNTER — Other Ambulatory Visit (INDEPENDENT_AMBULATORY_CARE_PROVIDER_SITE_OTHER): Payer: BC Managed Care – HMO

## 2013-11-09 DIAGNOSIS — E0500 Thyrotoxicosis with diffuse goiter without thyrotoxic crisis or storm: Secondary | ICD-10-CM

## 2013-11-09 LAB — T4, FREE: Free T4: 0.66 ng/dL (ref 0.60–1.60)

## 2013-11-09 LAB — TSH: TSH: 1.99 u[IU]/mL (ref 0.35–5.50)

## 2013-11-09 LAB — CBC
HCT: 40.1 % (ref 36.0–46.0)
Hemoglobin: 13.1 g/dL (ref 12.0–15.0)
MCHC: 32.6 g/dL (ref 30.0–36.0)
MCV: 84.4 fl (ref 78.0–100.0)
Platelets: 256 10*3/uL (ref 150.0–400.0)
RBC: 4.75 Mil/uL (ref 3.87–5.11)
RDW: 17.1 % — ABNORMAL HIGH (ref 11.5–14.6)
WBC: 7.2 10*3/uL (ref 4.5–10.5)

## 2013-11-09 LAB — T3, FREE: T3, Free: 2.5 pg/mL (ref 2.3–4.2)

## 2013-11-12 ENCOUNTER — Telehealth: Payer: Self-pay | Admitting: *Deleted

## 2013-11-12 NOTE — Telephone Encounter (Signed)
Called pt and advised her that her Thyroid results are good, stay off methimazole until she returns in 1 month to see Dr Elvera Lennox. Also advised her that her WBC was good. Pt understood.

## 2013-11-12 NOTE — Telephone Encounter (Signed)
Pt called requesting lab results [phone]). Please advise.

## 2013-11-12 NOTE — Telephone Encounter (Signed)
Please read her the result note - I have already sent her the message through MyChart.

## 2013-11-16 ENCOUNTER — Ambulatory Visit (INDEPENDENT_AMBULATORY_CARE_PROVIDER_SITE_OTHER): Payer: BC Managed Care – HMO | Admitting: Adult Health

## 2013-11-16 ENCOUNTER — Encounter: Payer: Self-pay | Admitting: Adult Health

## 2013-11-16 VITALS — BP 126/78 | HR 95 | Temp 98.4°F | Resp 16 | Wt 192.0 lb

## 2013-11-16 DIAGNOSIS — R062 Wheezing: Secondary | ICD-10-CM | POA: Insufficient documentation

## 2013-11-16 MED ORDER — AZITHROMYCIN 250 MG PO TABS: ORAL_TABLET | ORAL | Status: DC

## 2013-11-16 MED ORDER — ALBUTEROL SULFATE HFA 108 (90 BASE) MCG/ACT IN AERS: 2.0000 | INHALATION_SPRAY | Freq: Four times a day (QID) | RESPIRATORY_TRACT | Status: DC | PRN

## 2013-11-16 NOTE — Assessment & Plan Note (Signed)
Congestion, wheezing throughout. Albuterol tx in office. Start Azithromycin and ventolin inhaler. RTC if not improvement by Monday.

## 2013-11-16 NOTE — Progress Notes (Signed)
  Subjective:    Patient ID: Katelyn Friedman, female    DOB: 06-Sep-1951, 62 y.o.   MRN: [id number]  HPI  Pt is a pleasant 62 yo female, presents to clinic with chest congestion, wheezing, and cough with clear sputum for over a week.  Pt states it began with a sore throat 2 weeks ago which has since resolved.    Current Outpatient Prescriptions on File Prior to Visit  Medication Sig Dispense Refill  . acyclovir (ZOVIRAX) 400 MG tablet Take 1 tablet (400 mg total) by mouth every 4 (four) hours while awake.  180 tablet  2  . aspirin 81 MG tablet Take 81 mg by mouth every other day.      . Calcium Carbonate-Vitamin D (CALCIUM-VITAMIN D3 PO) Take by mouth daily.      . furosemide (LASIX) 20 MG tablet Take 1 tablet (20 mg total) by mouth daily.  30 tablet  3  . ibuprofen (ADVIL,MOTRIN) 200 MG tablet Take 200 mg by mouth every 6 (six) hours as needed.      . Multiple Vitamin (MULTIVITAMIN) tablet Take 1 tablet by mouth daily.      . ergocalciferol (VITAMIN D2) 50000 UNITS capsule Take 50,000 Units by mouth once a week.      . methimazole (TAPAZOLE) 5 MG tablet Take 1 tablet (5 mg total) by mouth 3 (three) times daily.  90 tablet  2   No current facility-administered medications on file prior to visit.     Review of Systems  HENT: Positive for congestion and sore throat.        Sore throat 2 weeks ago, has resolved  Respiratory: Positive for cough and wheezing.        Objective:   Physical Exam  Constitutional: She is oriented to person, place, and time. No distress.  Pulmonary/Chest: Effort normal. No respiratory distress. She has wheezes.  Lymphadenopathy:    She has no cervical adenopathy.  Neurological: She is alert and oriented to person, place, and time.  Skin: Skin is warm and dry.  Psychiatric: She has a normal mood and affect. Her behavior is normal. Judgment and thought content normal.    BP 126/78  Pulse 95  Temp(Src) 98.4 F (36.9 C) (Oral)  Wt 192 lb (87.091 kg)  SpO2  97%       Assessment & Plan:

## 2013-11-16 NOTE — Patient Instructions (Signed)
  Start Azithromycin - Take 2 tablets today then take 1 tablet daily for the next 4 days  Ventolin inhaler - 2 puffs into the lungs every 6 hours for the next 3 days. Thereafter use every 6 hours as needed for wheezing, shortness of breath

## 2013-12-12 ENCOUNTER — Ambulatory Visit (INDEPENDENT_AMBULATORY_CARE_PROVIDER_SITE_OTHER): Payer: BC Managed Care – HMO | Admitting: Internal Medicine

## 2013-12-12 ENCOUNTER — Encounter: Payer: Self-pay | Admitting: Internal Medicine

## 2013-12-12 VITALS — BP 116/78 | HR 95 | Temp 98.0°F | Resp 12 | Wt 195.1 lb

## 2013-12-12 DIAGNOSIS — E0500 Thyrotoxicosis with diffuse goiter without thyrotoxic crisis or storm: Secondary | ICD-10-CM

## 2013-12-12 LAB — TSH: TSH: 0.19 u[IU]/mL — ABNORMAL LOW (ref 0.35–5.50)

## 2013-12-12 LAB — T4, FREE: Free T4: 0.77 ng/dL (ref 0.60–1.60)

## 2013-12-12 LAB — T3, FREE: T3, Free: 2.9 pg/mL (ref 2.3–4.2)

## 2013-12-12 NOTE — Patient Instructions (Signed)
Please stop at the lab.  Stay off Methimazole for now. We will need repeat thyroid labs in 5-6 weeks afterwards. Please return in 4 months.

## 2013-12-12 NOTE — Progress Notes (Signed)
Patient ID: Katelyn Friedman, female   DOB: Nov 23, 1951, 62 y.o.   MRN: [id number]   HPI  Katelyn Friedman is a 62 y.o.-year-old female, returning for f/u for Graves ds.  Reviewed hx; Pt was first found to have a low TSH in 03/2013 by ObGyn. At that time, it was decided to just observe and repeat labs in the future. In 05/2013 >> started to have palpitations/CP/left side arm tingling >> saw Dr Darrick Huntsman >> sent to the hospital. Thyrotoxicosis was confirmed when a repeat TSH, returned at <0.010, fT4 1.64 (0.76-1.46), TT3 215 (71-180) on 07/20/2013. After these results, the pt was started on Methimazole 5 mg tid. Since last set of labs were normal on a minimal dose of MMI (5 mg daily), we stopped it 1 mo ago.  I reviewed the available thyroid tests in EPIC: Lab Results  Component Value Date   TSH 1.99 11/09/2013   TSH 1.30 09/26/2013   TSH 0.18* 04/04/2013   TSH 1.385 05/06/2010   TSH 2.774 04/17/2009   FREET4 0.66 11/09/2013   FREET4 0.61 09/26/2013     A thyroid uptake and scan was ordered and this was performed on 09/25/2013: c/w Graves ds: uniform uptake, elevated, at 49% (15-10%). No thyroid nodules.   Of note, she also had a thyroid U/S in 07/20/2013: size, shape and echotexture of the thyroid gland are normal. ? 7 mm nodule in L lobe.  Her thyroid antibodies: TSI were also elevated: 338 (<140), c/w Graves ds.   Pt denies feeling nodules in neck, hoarseness, dysphagia/odynophagia, SOB with lying down.  She c/o: - no more feeling "hyper" - improved fatigue - + occasional palpitations - no excessive sweating/heat intolerance - no tremors - some anxiety - especially after losing her father 3 mo ago - + still problems with concentration - + constipation >> not new - lost 10 lbs with Graves ds before starting MMI, now gained 3 lbs back in last 2 mo  She also has a history of GERD, OA, h/o TIA, HL, anxiety, back pain, vit D def.   ROS: Constitutional: see HPI, + poor sleep Eyes: no blurry  vision, + xerophthalmia, + occasional shooting pain in eyes, + nocturia ENT: no sore throat, no nodules palpated in throat, no dysphagia/odynophagia, no hoarseness Cardiovascular: no CP/SOB/+ palpitations/+ leg swelling Respiratory: no cough/SOB Gastrointestinal: no N/V/D/+ C, + heartburn Musculoskeletal: no muscle/joint aches Skin: no rashes Neurological: no tremors/numbness/tingling/dizziness Low libido  PE: BP 116/78  Pulse 95  Temp(Src) 98 F (36.7 C) (Oral)  Resp 12  Wt 195 lb 1.9 oz (88.506 kg)  SpO2 97% Wt Readings from Last 3 Encounters:  12/12/13 195 lb 1.9 oz (88.506 kg)  11/16/13 192 lb (87.091 kg)  09/26/13 192 lb (87.091 kg)   Constitutional: overweight, in NAD Eyes: PERRLA, EOMI, no exophthalmos, no lid lag, no stare ENT: moist mucous membranes, no thyromegaly, no thyroid bruits, no cervical lymphadenopathy Cardiovascular: RRR, No MRG Respiratory: CTA B Gastrointestinal: abdomen soft, NT, ND, BS+ Musculoskeletal: no deformities, strength intact in all 4 Skin: moist, warm, no rashes Neurological: no tremor with outstretched hands, DTR normal in all 4  ASSESSMENT: 1. Graves ds  PLAN:  1. Patient with recently diagnosed Graves ds. Sxs much better after starting MMI 61mo ago, and not changed after stopping MMi 1 mo ago. - will recheck the TSH, fT3 and fT4  - I will send her results through MyChart - RTC in 4 months, but sooner for repeat labs  Office Visit on 12/12/2013  Component Date Value Range Status  . TSH 12/12/2013 0.19* 0.35 - 5.50 uIU/mL Final  . Free T4 12/12/2013 0.77  0.60 - 1.60 ng/dL Final  . T3, Free 16/09/9603 2.9  2.3 - 4.2 pg/mL Final   Will advise to restart MMI 2.5 mg daily and recheck labs in 2 mo.

## 2013-12-14 MED ORDER — METHIMAZOLE 5 MG PO TABS: 2.5000 mg | ORAL_TABLET | Freq: Every day | ORAL | Status: DC

## 2014-01-16 ENCOUNTER — Other Ambulatory Visit (INDEPENDENT_AMBULATORY_CARE_PROVIDER_SITE_OTHER): Payer: BC Managed Care – HMO

## 2014-01-16 DIAGNOSIS — E0500 Thyrotoxicosis with diffuse goiter without thyrotoxic crisis or storm: Secondary | ICD-10-CM

## 2014-01-16 LAB — TSH: TSH: 0.7 u[IU]/mL (ref 0.35–5.50)

## 2014-01-16 LAB — T4, FREE: Free T4: 0.79 ng/dL (ref 0.60–1.60)

## 2014-01-18 ENCOUNTER — Encounter: Payer: Self-pay | Admitting: Internal Medicine

## 2014-01-18 ENCOUNTER — Ambulatory Visit (INDEPENDENT_AMBULATORY_CARE_PROVIDER_SITE_OTHER): Payer: BC Managed Care – HMO | Admitting: Internal Medicine

## 2014-01-18 VITALS — BP 124/78 | HR 62 | Temp 98.2°F | Resp 18 | Wt 197.5 lb

## 2014-01-18 DIAGNOSIS — Z86010 Personal history of colonic polyps: Secondary | ICD-10-CM

## 2014-01-18 DIAGNOSIS — K219 Gastro-esophageal reflux disease without esophagitis: Secondary | ICD-10-CM

## 2014-01-18 DIAGNOSIS — Z1211 Encounter for screening for malignant neoplasm of colon: Secondary | ICD-10-CM

## 2014-01-18 DIAGNOSIS — E0500 Thyrotoxicosis with diffuse goiter without thyrotoxic crisis or storm: Secondary | ICD-10-CM

## 2014-01-18 MED ORDER — ESOMEPRAZOLE MAGNESIUM 20 MG PO CPDR
40.0000 mg | DELAYED_RELEASE_CAPSULE | Freq: Every day | ORAL | Status: DC
Start: 2014-01-18 — End: 2014-03-26

## 2014-01-18 NOTE — Progress Notes (Signed)
Pre-visit discussion using our clinic review tool. No additional management support is needed unless otherwise documented below in the visit note.  

## 2014-01-18 NOTE — Progress Notes (Signed)
Patient ID: Katelyn Friedman, female   DOB: [birth year], 63 y.o.   MRN: [id number]   Patient Active Problem List   Diagnosis Date Noted  . Wheezing 11/16/2013  . Graves disease 09/25/2013  . Abnormal mammogram with microcalcification 08/04/2013  . Vitamin D deficiency 08/04/2013  . Disturbance of skin sensation 07/20/2013  . Transient cerebral ischemia 07/20/2013  . Acute back pain with sciatica 01/28/2013  . Obesity (BMI 30-39.9) 05/01/2012  . 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 Arthritis   . Allergic rhinitis   . Herpes simplex   . GERD (gastroesophageal reflux disease)   . History of colon polyps     Subjective:  CC:   Chief Complaint  Patient presents with  . Acute Visit    formed black stool, hypogastric pain, burning in throat laying down at night makes symptoms worse.  . Palpitations    periodically during the day.    HPI:   Katelyn Friedman a 63 y.o. female who presents Acid reflux and gastritis  Acc by RLQ pain with black stools noted a daily basis for approximately one week. Symptoms resolved spontaneously over a week ago. She is not taking any proton pump inhibitor and has no frequent use of nonsteroidal anti-inflammatories. She has not had any unintentional weight loss. She currently denies abdominal pain.   Past Medical History  Diagnosis Date  . Arthritis   . Allergic rhinitis   . Herpes simplex   . GERD (gastroesophageal reflux disease)   . History of colon polyps     Past Surgical History  Procedure Laterality Date  . Rotator cuff repair  2006    Margaretmary Eddy   . Knee arthroscopy  1992       The following portions of the patient's history were reviewed and updated as appropriate: Allergies, current medications, and problem list.    Review of Systems:   12 Pt  review of systems was negative except those addressed in the HPI,     History   Social History  . Marital Status: Married    Spouse Name: N/A    Number of Children: N/A   . Years of Education: N/A   Occupational History  . Not on file.   Social History Main Topics  . Smoking status: Never Smoker   . Smokeless tobacco: Never Used  . Alcohol Use: No  . Drug Use: No  . Sexual Activity: Yes    Partners: Male   Other Topics Concern  . Not on file   Social History Narrative   Married: 19 yrs   Regular exercise: walk   Caffeine use: none    Objective:  Filed Vitals:   01/18/14 1556  BP: 124/78  Pulse: 62  Temp: 98.2 F (36.8 C)  Resp: 18     General appearance: alert, cooperative and appears stated age Ears: normal TM's and external ear canals both ears Throat: lips, mucosa, and tongue normal; teeth and gums normal Neck: no adenopathy, no carotid bruit, supple, symmetrical, trachea midline and thyroid not enlarged, symmetric, no tenderness/mass/nodules Back: symmetric, no curvature. ROM normal. No CVA tenderness. Lungs: clear to auscultation bilaterally Heart: regular rate and rhythm, S1, S2 normal, no murmur, click, rub or gallop Abdomen: soft, non-tender; bowel sounds normal; no masses,  no organomegaly Rectal:  Stool in vault,  No melena ,  Heme negative Pulses: 2+ and symmetric Skin: Skin color, texture, turgor normal. No rashes or lesions Lymph nodes: Cervical, supraclavicular, and axillary nodes normal.  Assessment and  Plan:  Graves disease tsh and free t 4 are now normal on methimazole 5 mg 3 times a day. Followup with Dr. Cruzita Lederer.  She has gained some weight back after control of hyperthyroidism.  GERD (gastroesophageal reflux disease) Recommended that patient start taking a proton pump inhibitor daily for prevention of symptoms. Samples of Nexium 20 mg given to her. She should take 40 mg daily for 2 weeks followed by reduction in dose to 20 mg daily. Symptoms persist she will be referred for endoscopy evaluation.  History of colon polyps She is overdue for colonoscopy. Given her recent episode of black stool of the thecal  cold blood test today which was negative. Referral to Dr. Tollie Pizza for colonoscopy.   Updated Medication List Outpatient Encounter Prescriptions as of 01/18/2014  Medication Sig  . acyclovir (ZOVIRAX) 400 MG tablet Take 1 tablet (400 mg total) by mouth every 4 (four) hours while awake.  . albuterol (PROVENTIL HFA;VENTOLIN HFA) 108 (90 BASE) MCG/ACT inhaler Inhale 2 puffs into the lungs every 6 (six) hours as needed for wheezing or shortness of breath.  [name] aspirin 81 MG tablet Take 81 mg by mouth every other day.  . Calcium Carbonate-Vitamin D (CALCIUM-VITAMIN D3 PO) Take by mouth daily.  . calcium-vitamin D 250-100 MG-UNIT per tablet Take 1 tablet by mouth 2 (two) times daily.  . furosemide (LASIX) 20 MG tablet Take 1 tablet (20 mg total) by mouth daily.  [name] ibuprofen (ADVIL,MOTRIN) 200 MG tablet Take 200 mg by mouth every 6 (six) hours as needed.  . Multiple Vitamin (MULTIVITAMIN) tablet Take 1 tablet by mouth daily.  . ergocalciferol (VITAMIN D2) 50000 UNITS capsule Take 50,000 Units by mouth once a week.  . esomeprazole (NEXIUM) 20 MG capsule Take 2 capsules (40 mg total) by mouth daily at 12 noon. Daily in the morning for two weeks,  Then one tablet daily thereafter  . methimazole (TAPAZOLE) 5 MG tablet Take 0.5 tablets (2.5 mg total) by mouth daily.  . [DISCONTINUED] azithromycin (ZITHROMAX) 250 MG tablet Take 2 tablets today then take 1 tablet daily for the next 4 days

## 2014-01-18 NOTE — Patient Instructions (Addendum)
I am prescribing nexium 24 hr ,  2 capsules once daily in the morning for two weeks,  Then once daily thereafter If symptoms recur ,  Discuss with Dr. Bary Castilla and he may decide to add an EGD (upper endoscopy) to your test  Avoid ibuprofen for now use tylenol as needed for joint pain   Referral to Dr Bary Castilla for screening colonoscopy  Your thyroid function is now normalized.    Gastroesophageal Reflux Disease, Adult Gastroesophageal reflux disease (GERD) happens when acid from your stomach flows up into the esophagus. When acid comes in contact with the esophagus, the acid causes soreness (inflammation) in the esophagus. Over time, GERD may create small holes (ulcers) in the lining of the esophagus. CAUSES   Increased body weight. This puts pressure on the stomach, making acid rise from the stomach into the esophagus.  Smoking. This increases acid production in the stomach.  Drinking alcohol. This causes decreased pressure in the lower esophageal sphincter (valve or ring of muscle between the esophagus and stomach), allowing acid from the stomach into the esophagus.  Late evening meals and a full stomach. This increases pressure and acid production in the stomach.  A malformed lower esophageal sphincter. Sometimes, no cause is found. SYMPTOMS   Burning pain in the lower part of the mid-chest behind the breastbone and in the mid-stomach area. This may occur twice a week or more often.  Trouble swallowing.  Sore throat.  Dry cough.  Asthma-like symptoms including chest tightness, shortness of breath, or wheezing. DIAGNOSIS  Your caregiver may be able to diagnose GERD based on your symptoms. In some cases, X-rays and other tests may be done to check for complications or to check the condition of your stomach and esophagus. TREATMENT  Your caregiver may recommend over-the-counter or prescription medicines to help decrease acid production. Ask your caregiver before starting or adding  any new medicines.  HOME CARE INSTRUCTIONS   Change the factors that you can control. Ask your caregiver for guidance concerning weight loss, quitting smoking, and alcohol consumption.  Avoid foods and drinks that make your symptoms worse, such as:  Caffeine or alcoholic drinks.  Chocolate.  Peppermint or mint flavorings.  Garlic and onions.  Spicy foods.  Citrus fruits, such as oranges, lemons, or limes.  Tomato-based foods such as sauce, chili, salsa, and pizza.  Fried and fatty foods.  Avoid lying down for the 3 hours prior to your bedtime or prior to taking a nap.  Eat small, frequent meals instead of large meals.  Wear loose-fitting clothing. Do not wear anything tight around your waist that causes pressure on your stomach.  Raise the head of your bed 6 to 8 inches with wood blocks to help you sleep. Extra pillows will not help.  Only take over-the-counter or prescription medicines for pain, discomfort, or fever as directed by your caregiver.  Do not take aspirin, ibuprofen, or other nonsteroidal anti-inflammatory drugs (NSAIDs). SEEK IMMEDIATE MEDICAL CARE IF:   You have pain in your arms, neck, jaw, teeth, or back.  Your pain increases or changes in intensity or duration.  You develop nausea, vomiting, or sweating (diaphoresis).  You develop shortness of breath, or you faint.  Your vomit is green, yellow, black, or looks like coffee grounds or blood.  Your stool is red, bloody, or black. These symptoms could be signs of other problems, such as heart disease, gastric bleeding, or esophageal bleeding. MAKE SURE YOU:   Understand these instructions.  Will watch your  condition.  Will get help right away if you are not doing well or get worse. Document Released: 09/22/2005 Document Revised: 03/06/2012 Document Reviewed: 07/02/2011 [facility] Patient Information 2014 [place], [place].

## 2014-01-18 NOTE — Assessment & Plan Note (Addendum)
tsh and free t 4 are now normal on methimazole 5 mg 3 times a day. Followup with Dr. Cruzita Lederer.  She has gained some weight back after control of hyperthyroidism.

## 2014-01-20 NOTE — Assessment & Plan Note (Addendum)
Recommended that patient start taking a proton pump inhibitor daily for prevention of symptoms. Samples of Nexium 20 mg given to her. She should take 40 mg daily for 2 weeks followed by reduction in dose to 20 mg daily. Symptoms persist she will be referred for endoscopy evaluation.

## 2014-01-20 NOTE — Assessment & Plan Note (Signed)
She is overdue for colonoscopy. Given her recent episode of black stool of the thecal cold blood test today which was negative. Referral to Dr. Tollie Pizza for colonoscopy.

## 2014-02-06 ENCOUNTER — Ambulatory Visit: Payer: Self-pay | Admitting: General Surgery

## 2014-02-14 ENCOUNTER — Ambulatory Visit: Payer: Self-pay | Admitting: General Surgery

## 2014-02-18 ENCOUNTER — Encounter: Payer: Self-pay | Admitting: Internal Medicine

## 2014-02-18 ENCOUNTER — Ambulatory Visit (INDEPENDENT_AMBULATORY_CARE_PROVIDER_SITE_OTHER): Payer: BC Managed Care – HMO | Admitting: Internal Medicine

## 2014-02-18 VITALS — BP 124/72 | HR 67 | Temp 98.0°F | Resp 12 | Wt 195.0 lb

## 2014-02-18 DIAGNOSIS — E0500 Thyrotoxicosis with diffuse goiter without thyrotoxic crisis or storm: Secondary | ICD-10-CM

## 2014-02-18 NOTE — Progress Notes (Signed)
Patient ID: Katelyn Friedman, female   DOB: Apr 07, 1951, 63 y.o.   MRN: [id number]   HPI  Katelyn Friedman is a 63 y.o.-year-old female, returning as an acute visit for Graves ds. Last visit 2 mo ago.   She presents for left side of the face and she feels she cannot turn her head easily. The sxs started yesterday when she got up in am. She does not feel it got better. No pain with swallowing or other pain. She had a URI but 1 mo ago. No tooth ache. No fever/chills. She does not have problems with bending her head forward to touch chin to chest. She came for an urgent appt with me today as she believed her sxs are related to the thyroid.  Reviewed hx: Pt was first found to have a low TSH in 03/2013 by ObGyn. At that time, it was decided to just observe and repeat labs in the future. In 05/2013 >> started to have palpitations/CP/left side arm tingling >> saw Dr Derrel Nip >> sent to the hospital. Thyrotoxicosis was confirmed when a repeat TSH, returned at <0.010, fT4 1.64 (0.76-1.46), TT3 215 (71-180) on 07/20/2013. After these results, the pt was started on Methimazole 5 mg tid. Since last set of labs were normal on a minimal dose of MMI (5 mg daily), we stopped it in 10/2013.  I reviewed the available thyroid tests in EPIC: Lab Results  Component Value Date   TSH 0.70 01/16/2014   TSH 0.19* 12/12/2013   TSH 1.99 11/09/2013   TSH 1.30 09/26/2013   TSH 0.18* 04/04/2013   TSH 1.385 05/06/2010   TSH 2.774 04/17/2009   FREET4 0.79 01/16/2014   FREET4 0.77 12/12/2013   FREET4 0.66 11/09/2013   FREET4 0.61 09/26/2013     A thyroid uptake and scan was ordered and this was performed on 09/25/2013: c/w Graves ds: uniform uptake, elevated, at 49% (15-10%). No thyroid nodules.   Of note, she also had a thyroid U/S in 07/20/2013: size, shape and echotexture of the thyroid gland are normal. ? 7 mm nodule in L lobe.  Her thyroid antibodies: TSI were also elevated: 338 (<140), c/w Graves ds.   Pt denies feeling nodules  in neck, hoarseness, dysphagia/odynophagia, SOB with lying down.  She c/o: - no more feeling "hyper" - improved fatigue - + occasional palpitations - no excessive sweating/heat intolerance - no tremors - some anxiety - especially after losing her father 3 mo ago - + constipation >> not new - lost 10 lbs with Graves ds before starting MMI, now stabilized  She also has a history of GERD, OA, h/o TIA, HL, anxiety, back pain, vit D def.   ROS: Constitutional: see HPI Eyes: no blurry vision, + xerophthalmia ENT: no sore throat, no nodules palpated in throat, no dysphagia/odynophagia, no hoarseness. Ears: irritation of the tympanic mb in left year. No pain at pressure of tragus/antitragus. Mild swelling on Left side of face - no LN palpated Cardiovascular: no CP/SOB/+ palpitations/+ leg swelling (knee - L) Respiratory: no cough/SOB Gastrointestinal: no N/V/D/ C Musculoskeletal: no muscle/joint aches Skin: no rashes Neurological: no tremors/numbness/tingling/dizziness, HA  I reviewed pt's medications, allergies, PMH, social hx, family hx and no changes required, except as mentioned above.  PE: BP 124/72  Pulse 67  Temp(Src) 98 F (36.7 C) (Oral)  Resp 12  Wt 195 lb (88.451 kg)  SpO2 96% Wt Readings from Last 3 Encounters:  02/18/14 195 lb (88.451 kg)  01/18/14 197 lb 8 oz (89.585 kg)  12/12/13 195 lb 1.9 oz (88.506 kg)   Constitutional: overweight, in NAD Eyes: PERRLA, EOMI, no exophthalmos, no lid lag, no stare ENT: moist mucous membranes, no thyromegaly, no cervical lymphadenopathy Cardiovascular: RRR, No MRG Respiratory: CTA B Gastrointestinal: abdomen soft, NT, ND, BS+ Musculoskeletal: no deformities, strength intact in all 4 Skin: moist, warm, no rashes Neurological: no tremor with outstretched hands, DTR normal in all 4  ASSESSMENT: 1. Graves ds  2. L ear inflammation  PLAN:  1. Patient with resolved Graves ds. Sxs much better after starting MMI and not changed  after stopping MMi 3 mo ago. - will recheck the TSH, fT3 and fT4  In 2 mo - I will see her in 5 mo  2. L ear inflammation or viral otitis - no fever/chills, and no pain - has mild swelling on L side of zygomatic arch, possibly from the ear - advised to see PCP if not better in 2-3 days.

## 2014-02-18 NOTE — Patient Instructions (Signed)
You may have a ear inflammation/viral infection. Please see Dr Derrel Nip if you develop a fever or if the discomfort does not go away after ~ 2-3 days.  Please change the April appointment to a lab appointment.  Please schedule another appt with me in July.

## 2014-02-25 ENCOUNTER — Ambulatory Visit (INDEPENDENT_AMBULATORY_CARE_PROVIDER_SITE_OTHER): Payer: BC Managed Care – HMO | Admitting: Internal Medicine

## 2014-02-25 ENCOUNTER — Encounter: Payer: Self-pay | Admitting: Internal Medicine

## 2014-02-25 VITALS — BP 122/74 | HR 75 | Temp 98.9°F | Resp 18

## 2014-02-25 DIAGNOSIS — J020 Streptococcal pharyngitis: Secondary | ICD-10-CM

## 2014-02-25 DIAGNOSIS — R52 Pain, unspecified: Secondary | ICD-10-CM

## 2014-02-25 LAB — POC INFLUENZA A&B (BINAX/QUICKVUE)
Influenza A, POC: NEGATIVE
Influenza B, POC: NEGATIVE

## 2014-02-25 LAB — POCT RAPID STREP A (OFFICE): Rapid Strep A Screen: POSITIVE — AB

## 2014-02-25 MED ORDER — PREDNISONE (PAK) 10 MG PO TABS: ORAL_TABLET | ORAL | Status: DC

## 2014-02-25 MED ORDER — LEVOFLOXACIN 500 MG PO TABS
500.0000 mg | ORAL_TABLET | Freq: Every day | ORAL | Status: DC
Start: 2014-02-25 — End: 2014-03-26

## 2014-02-25 NOTE — Patient Instructions (Addendum)
You have Strep Throat. I am treating you with 7 days of Levaquin, an antibiotic  Start it tonight.  you can take tylenol  for the body aches,  And OTC Sudafed PE 10 mg every 6to 8  hours for the sinus and ear congestion  Start the 6 day prednisone taper in the morning.    Strep Throat Strep throat is an infection of the throat caused by a bacteria named Streptococcus pyogenes. Your caregiver may call the infection streptococcal "tonsillitis" or "pharyngitis" depending on whether there are signs of inflammation in the tonsils or back of the throat. Strep throat is most common in children aged 5 15 years during the cold months of the year, but it can occur in people of any age during any season. This infection is spread from person to person (contagious) through coughing, sneezing, or other close contact. SYMPTOMS   Fever or chills.  Painful, swollen, red tonsils or throat.  Pain or difficulty when swallowing.  White or yellow spots on the tonsils or throat.  Swollen, tender lymph nodes or "glands" of the neck or under the jaw.  Red rash all over the body (rare). DIAGNOSIS  Many different infections can cause the same symptoms. A test must be done to confirm the diagnosis so the right treatment can be given. A "rapid strep test" can help your caregiver make the diagnosis in a few minutes. If this test is not available, a light swab of the infected area can be used for a throat culture test. If a throat culture test is done, results are usually available in a day or two. TREATMENT  Strep throat is treated with antibiotic medicine. HOME CARE INSTRUCTIONS   Gargle with 1 tsp of salt in 1 cup of warm water, 3 4 times per day or as needed for comfort.  Family members who also have a sore throat or fever should be tested for strep throat and treated with antibiotics if they have the strep infection.  Make sure everyone in your household washes their hands well.  Do not share food, drinking  cups, or personal items that could cause the infection to spread to others.  You may need to eat a soft food diet until your sore throat gets better.  Drink enough water and fluids to keep your urine clear or pale yellow. This will help prevent dehydration.  Get plenty of rest.  Stay home from school, daycare, or work until you have been on antibiotics for 24 hours.  Only take over-the-counter or prescription medicines for pain, discomfort, or fever as directed by your caregiver.  If antibiotics are prescribed, take them as directed. Finish them even if you start to feel better. SEEK MEDICAL CARE IF:   The glands in your neck continue to enlarge.  You develop a rash, cough, or earache.  You cough up green, yellow-brown, or bloody sputum.  You have pain or discomfort not controlled by medicines.  Your problems seem to be getting worse rather than better. SEEK IMMEDIATE MEDICAL CARE IF:   You develop any new symptoms such as vomiting, severe headache, stiff or painful neck, chest pain, shortness of breath, or trouble swallowing.  You develop severe throat pain, drooling, or changes in your voice.  You develop swelling of the neck, or the skin on the neck becomes red and tender.  You have a fever.  You develop signs of dehydration, such as fatigue, dry mouth, and decreased urination.  You become increasingly sleepy, or you  cannot wake up completely. Document Released: 12/10/2000 Document Revised: 11/29/2012 Document Reviewed: 02/11/2011 [facility] Patient Information 2014 [place], [place].

## 2014-02-25 NOTE — Progress Notes (Signed)
Patient ID: Katelyn Friedman, female   DOB: 1951-06-20, 63 y.o.   MRN: [id number]  Patient Active Problem List   Diagnosis Date Noted  . Strep pharyngitis 02/26/2014  . Wheezing 11/16/2013  . Graves disease 09/25/2013  . Abnormal mammogram with microcalcification 08/04/2013  . Vitamin D deficiency 08/04/2013  . Disturbance of skin sensation 07/20/2013  . Transient cerebral ischemia 07/20/2013  . Acute back pain with sciatica 01/28/2013  . Obesity (BMI 30-39.9) 05/01/2012  . 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 Arthritis   . Allergic rhinitis   . Herpes simplex   . GERD (gastroesophageal reflux disease)   . History of colon polyps     Subjective:  CC:   Chief Complaint  Patient presents with  . Sinusitis    Patient thinks  . Sore Throat  . Cough    non productive    HPI:   Katelyn Friedman is a 63 y.o. female who presents for One week history of sore throat.  Saw thyroid doctor last Monday  Thyroid was checked and  Normal.  Sinuses have been draining and cough .   No fevers.  Bilaterally maxillary sinus and ear pressure.  Left more than right,  No ear ache. Nasal drainage is clear. Some body aches,  Mild for the last several days .       Past Medical History  Diagnosis Date  . Arthritis   . Allergic rhinitis   . Herpes simplex   . GERD (gastroesophageal reflux disease)   . History of colon polyps     Past Surgical History  Procedure Laterality Date  . Rotator cuff repair  2006    Margaretmary Eddy   . Knee arthroscopy  1992       The following portions of the patient's history were reviewed and updated as appropriate: Allergies, current medications, and problem list.    Review of Systems:   Patient denies headache, fevers, malaise, unintentional weight loss, skin rash, eye pain, sinus congestion and sinus pain, sore throat, dysphagia,  hemoptysis , cough, dyspnea, wheezing, chest pain, palpitations, orthopnea, edema, abdominal pain, nausea, melena,  diarrhea, constipation, flank pain, dysuria, hematuria, urinary  Frequency, nocturia, numbness, tingling, seizures,  Focal weakness, Loss of consciousness,  Tremor, insomnia, depression, anxiety, and suicidal ideation.     History   Social History  . Marital Status: Married    Spouse Name: N/A    Number of Children: N/A  . Years of Education: N/A   Occupational History  . Not on file.   Social History Main Topics  . Smoking status: Never Smoker   . Smokeless tobacco: Never Used  . Alcohol Use: No  . Drug Use: No  . Sexual Activity: Yes    Partners: Male   Other Topics Concern  . Not on file   Social History Narrative   Married: 19 yrs   Regular exercise: walk   Caffeine use: none    Objective:  Filed Vitals:   02/25/14 1733  BP: 122/74  Pulse: 75  Temp: 98.9 F (37.2 C)  Resp: 18     General appearance: sickly appearing, alert, cooperative and appears stated age Ears: erythematous TMS bilaterally Throat:  thraot red, no ulcerations, lips, mucosa, and tongue normal; teeth and gums normal Neck: cervical LAD, no carotid bruit, supple, symmetrical, trachea midline and thyroid not enlarged, symmetric, no tenderness/mass/nodules Back: symmetric, no curvature. ROM normal. No CVA tenderness. Lungs: clear to auscultation bilaterally Heart: regular rate and rhythm, S1, S2 normal,  no murmur, click, rub or gallop Abdomen: soft, non-tender; bowel sounds normal; no masses,  no organomegaly Pulses: 2+ and symmetric Skin: Skin color, texture, turgor normal. No rashes or lesions Lymph nodes: Cervical, supraclavicular, and axillary nodes normal.  Assessment and Plan:  Strep pharyngitis Has gi intolerance to augmentin.  rx levaquin,  Work note,  Tylenol prednisone taper for concurrent otitis media   Updated Medication List Outpatient Encounter Prescriptions as of 02/25/2014  Medication Sig  . acyclovir (ZOVIRAX) 400 MG tablet Take 1 tablet (400 mg total) by mouth every 4  (four) hours while awake.  . albuterol (PROVENTIL HFA;VENTOLIN HFA) 108 (90 BASE) MCG/ACT inhaler Inhale 2 puffs into the lungs every 6 (six) hours as needed for wheezing or shortness of breath.  [name] aspirin 81 MG tablet Take 81 mg by mouth every other day.  . Calcium Carbonate-Vitamin D (CALCIUM-VITAMIN D3 PO) Take by mouth daily.  . calcium-vitamin D 250-100 MG-UNIT per tablet Take 1 tablet by mouth 2 (two) times daily.  [name] esomeprazole (NEXIUM) 20 MG capsule Take 2 capsules (40 mg total) by mouth daily at 12 noon. Daily in the morning for two weeks,  Then one tablet daily thereafter  . furosemide (LASIX) 20 MG tablet Take 1 tablet (20 mg total) by mouth daily.  [name] ibuprofen (ADVIL,MOTRIN) 200 MG tablet Take 200 mg by mouth every 6 (six) hours as needed.  . Multiple Vitamin (MULTIVITAMIN) tablet Take 1 tablet by mouth daily.  . ergocalciferol (VITAMIN D2) 50000 UNITS capsule Take 50,000 Units by mouth once a week.  [name] levofloxacin (LEVAQUIN) 500 MG tablet Take 1 tablet (500 mg total) by mouth daily.  . methimazole (TAPAZOLE) 5 MG tablet Take 0.5 tablets (2.5 mg total) by mouth daily.  . predniSONE (STERAPRED UNI-PAK) 10 MG tablet .6 tablets on Day 1 , then reduce by 1 tablet daily until gone     Orders Placed This Encounter  Procedures  . POCT rapid strep A  . POC Influenza A&B (Binax test)    No Follow-up on file.

## 2014-02-26 DIAGNOSIS — J020 Streptococcal pharyngitis: Secondary | ICD-10-CM | POA: Insufficient documentation

## 2014-02-26 NOTE — Assessment & Plan Note (Signed)
Has gi intolerance to augmentin.  rx levaquin,  Work note,  Tylenol prednisone taper for concurrent otitis media

## 2014-02-28 ENCOUNTER — Ambulatory Visit: Payer: BC Managed Care – HMO | Admitting: Adult Health

## 2014-03-06 ENCOUNTER — Ambulatory Visit: Payer: Self-pay | Admitting: General Surgery

## 2014-03-26 ENCOUNTER — Ambulatory Visit (INDEPENDENT_AMBULATORY_CARE_PROVIDER_SITE_OTHER): Payer: BC Managed Care – PPO | Admitting: General Surgery

## 2014-03-26 ENCOUNTER — Encounter: Payer: Self-pay | Admitting: General Surgery

## 2014-03-26 VITALS — BP 140/84 | HR 66 | Resp 14 | Ht 62.5 in | Wt 196.0 lb

## 2014-03-26 DIAGNOSIS — Z1211 Encounter for screening for malignant neoplasm of colon: Secondary | ICD-10-CM | POA: Insufficient documentation

## 2014-03-26 MED ORDER — POLYETHYLENE GLYCOL 3350 17 GM/SCOOP PO POWD: ORAL | Status: DC

## 2014-03-26 NOTE — Patient Instructions (Addendum)
Patient to be scheduled for colonoscopy. The patient is aware to call back for any questions or concerns.   Colonoscopy A colonoscopy is an exam to look at the entire large intestine (colon). This exam can help find problems such as tumors, polyps, inflammation, and areas of bleeding. The exam takes about 1 hour.  LET [facility] CARE PROVIDER KNOW ABOUT:   Any allergies you have.  All medicines you are taking, including vitamins, herbs, eye drops, creams, and over-the-counter medicines.  Previous problems you or members of your family have had with the use of anesthetics.  Any blood disorders you have.  Previous surgeries you have had.  Medical conditions you have. RISKS AND COMPLICATIONS  Generally, this is a safe procedure. However, as with any procedure, complications can occur. Possible complications include:  Bleeding.  Tearing or rupture of the colon wall.  Reaction to medicines given during the exam.  Infection (rare). BEFORE THE PROCEDURE   Ask your health care provider about changing or stopping your regular medicines.  You may be prescribed an oral bowel prep. This involves drinking a large amount of medicated liquid, starting the day before your procedure. The liquid will cause you to have multiple loose stools until your stool is almost clear or light green. This cleans out your colon in preparation for the procedure.  Do not eat or drink anything else once you have started the bowel prep, unless your health care provider tells you it is safe to do so.  Arrange for someone to drive you home after the procedure. PROCEDURE   You will be given medicine to help you relax (sedative).  You will lie on your side with your knees bent.  A long, flexible tube with a light and camera on the end (colonoscope) will be inserted through the rectum and into the colon. The camera sends video back to a computer screen as it moves through the colon. The colonoscope also releases  carbon dioxide gas to inflate the colon. This helps your health care provider see the area better.  During the exam, your health care provider may take a small tissue sample (biopsy) to be examined under a microscope if any abnormalities are found.  The exam is finished when the entire colon has been viewed. AFTER THE PROCEDURE   Do not drive for 24 hours after the exam.  You may have a small amount of blood in your stool.  You may pass moderate amounts of gas and have mild abdominal cramping or bloating. This is caused by the gas used to inflate your colon during the exam.  Ask when your test results will be ready and how you will get your results. Make sure you get your test results. Document Released: 12/10/2000 Document Revised: 10/03/2013 Document Reviewed: 08/20/2013 [facility] Patient Information 2014 [place].  Patient has been scheduled for a colonoscopy on 05-07-2014 at [facility].  It is okay for patient to continue 81 mg aspirin.

## 2014-03-26 NOTE — Progress Notes (Signed)
Patient ID: Katelyn Friedman, female   DOB: December 06, 1951, 63 y.o.   MRN: [id number]  Chief Complaint  Patient presents with  . Other    colonoscopy    HPI Katelyn Friedman is a 63 y.o. female who presents for an evaluation of a colonscopy. Her last colonoscopy was performed in 2005. The patient has a history of colon polyps as well as a maternal aunt who has colon cancer. She also has a history of constipation but this is not something new for her.    HPI  Past Medical History  Diagnosis Date  . Arthritis   . Allergic rhinitis   . Herpes simplex   . GERD (gastroesophageal reflux disease)   . History of colon polyps   . IBS (irritable bowel syndrome)   . Thyroid disease     graves disease    Past Surgical History  Procedure Laterality Date  . Rotator cuff repair  2006    Margaretmary Eddy   . Knee arthroscopy  1992  . Tubal ligation    . Tonsillectomy    . Breast biopsy Right 2014  . Colonoscopy  2006    Family History  Problem Relation Age of Onset  . Stroke Mother   . Dementia Father   . Colon polyps Maternal Aunt   . Cancer Maternal Aunt     colon    Social History History  Substance Use Topics  . Smoking status: Never Smoker   . Smokeless tobacco: Never Used  . Alcohol Use: No    Allergies  Allergen Reactions  . Codeine Nausea Only  . Influenza Vaccines Swelling    Redness, Fever    Current Outpatient Prescriptions  Medication Sig Dispense Refill  . albuterol (PROVENTIL HFA;VENTOLIN HFA) 108 (90 BASE) MCG/ACT inhaler Inhale 2 puffs into the lungs every 6 (six) hours as needed for wheezing or shortness of breath.  1 Inhaler  2  . aspirin 81 MG tablet Take 81 mg by mouth every other day.      . Cholecalciferol (VITAMIN D-3) 1000 UNITS CAPS Take 1 capsule by mouth daily.      . Esomeprazole Magnesium (NEXIUM PO) Take by mouth.      . methimazole (TAPAZOLE) 5 MG tablet Take 0.5 tablets (2.5 mg total) by mouth daily.  1 tablet  1  . polyethylene glycol powder  (GLYCOLAX/MIRALAX) powder 255 grams one bottle for colonoscopy prep  255 g  0   No current facility-administered medications for this visit.    Review of Systems Review of Systems  Constitutional: Negative.   Respiratory: Negative.   Cardiovascular: Negative.   Gastrointestinal: Positive for constipation.    Blood pressure 140/84, pulse 66, resp. rate 14, height 5' 2.5" (1.588 m), weight 196 lb (88.905 kg).  Physical Exam Physical Exam  Constitutional: She is oriented to person, place, and time. She appears well-developed and well-nourished.  Neck: Neck supple. No thyromegaly present.  Cardiovascular: Normal rate, regular rhythm and normal heart sounds.   No murmur heard. Pulmonary/Chest: Effort normal and breath sounds normal.  Lymphadenopathy:    She has no cervical adenopathy.  Neurological: She is alert and oriented to person, place, and time.  Skin: Skin is warm and dry.    Data Reviewed EMR Reviewed.  Colonoscopy dated January 29, 2004 completed by Frederich Cha, M.D. Notable only for petechiae. 3 mm polyp in the sigmoid colon reported. Pathology not available.  Well-controlled Graves' disease.  Assessment    Candidate for screening colonoscopy.  Plan    The pros and cons of screening exams were reviewed. Her reflux symptoms are modest, and she may use a p.r.n. Nexium for this.She was advised that she may obtain prompt relief of symptoms with the use of an H2 blocker such as Zantac. No dysphasia toward upper endoscopy.    Patient has been scheduled for a colonoscopy on 05-07-2014 at [facility]. It is okay for patient to continue 81 mg aspirin.   PCP and Ref. MD: Deborra Medina MD   Robert Bellow 03/26/2014, 9:13 PM

## 2014-04-05 ENCOUNTER — Telehealth: Payer: Self-pay | Admitting: Internal Medicine

## 2014-04-05 DIAGNOSIS — Z0279 Encounter for issue of other medical certificate: Secondary | ICD-10-CM

## 2014-04-05 NOTE — Telephone Encounter (Signed)
Pt dropped off health examination certificate   Need by 4/15 if possible Call when ready for pick up  [phone]  In box

## 2014-04-05 NOTE — Telephone Encounter (Signed)
In red folder. 

## 2014-04-09 NOTE — Telephone Encounter (Signed)
Patient scheduled for Tdap.

## 2014-04-10 ENCOUNTER — Ambulatory Visit: Payer: BC Managed Care – HMO | Admitting: Internal Medicine

## 2014-04-10 ENCOUNTER — Ambulatory Visit (INDEPENDENT_AMBULATORY_CARE_PROVIDER_SITE_OTHER): Payer: BC Managed Care – HMO

## 2014-04-10 ENCOUNTER — Other Ambulatory Visit (INDEPENDENT_AMBULATORY_CARE_PROVIDER_SITE_OTHER): Payer: BC Managed Care – HMO

## 2014-04-10 DIAGNOSIS — E0500 Thyrotoxicosis with diffuse goiter without thyrotoxic crisis or storm: Secondary | ICD-10-CM

## 2014-04-10 DIAGNOSIS — Z23 Encounter for immunization: Secondary | ICD-10-CM

## 2014-04-10 LAB — T4, FREE: Free T4: 0.79 ng/dL (ref 0.60–1.60)

## 2014-04-10 LAB — TSH: TSH: 1.17 u[IU]/mL (ref 0.35–5.50)

## 2014-04-10 LAB — T3, FREE: T3, Free: 3 pg/mL (ref 2.3–4.2)

## 2014-04-21 ENCOUNTER — Other Ambulatory Visit: Payer: Self-pay | Admitting: General Surgery

## 2014-04-21 DIAGNOSIS — Z1211 Encounter for screening for malignant neoplasm of colon: Secondary | ICD-10-CM

## 2014-05-01 ENCOUNTER — Telehealth: Payer: Self-pay

## 2014-05-01 NOTE — Telephone Encounter (Signed)
CALL TO PATIENT REGARDING HER UPCOMING COLONOSCOPY SCHEDULED FOR 05/07/14 AT [facility]. PATIENT HAS HAD NO CHANGES IN HEALTH STATUS,  MEDICATIONS, OR ALLERGIES. SHE WILL PRE REGISTER WITH THE HOSPITAL THIS WEEK. SHE IS AWARE OF DATE AND ALL INSTRUCTIONS.

## 2014-05-02 LAB — HM COLONOSCOPY: HM Colonoscopy: NORMAL

## 2014-05-07 ENCOUNTER — Ambulatory Visit: Payer: Self-pay | Admitting: General Surgery

## 2014-05-07 DIAGNOSIS — D129 Benign neoplasm of anus and anal canal: Secondary | ICD-10-CM

## 2014-05-07 DIAGNOSIS — D128 Benign neoplasm of rectum: Secondary | ICD-10-CM

## 2014-05-07 HISTORY — PX: COLONOSCOPY: SHX174

## 2014-05-10 LAB — PATHOLOGY REPORT

## 2014-05-13 ENCOUNTER — Encounter: Payer: Self-pay | Admitting: General Surgery

## 2014-05-13 ENCOUNTER — Telehealth: Payer: Self-pay | Admitting: *Deleted

## 2014-05-13 NOTE — Telephone Encounter (Signed)
Left message for Katelyn Friedman per Dr. Bary Castilla please notify the patient the the polyp removed at the time of her recent colonoscopy was entirely benign. A follow up exam in 10 years would be appropriate as long as she is not having any problems.

## 2014-05-14 ENCOUNTER — Telehealth: Payer: Self-pay | Admitting: *Deleted

## 2014-05-14 ENCOUNTER — Encounter: Payer: Self-pay | Admitting: General Surgery

## 2014-05-14 NOTE — Telephone Encounter (Signed)
Pt returning call back and was notified results.

## 2014-06-11 ENCOUNTER — Encounter: Payer: BC Managed Care – HMO | Admitting: Internal Medicine

## 2014-06-19 ENCOUNTER — Encounter: Payer: BC Managed Care – HMO | Admitting: Internal Medicine

## 2014-07-15 ENCOUNTER — Encounter: Payer: BC Managed Care – HMO | Admitting: Internal Medicine

## 2014-07-18 ENCOUNTER — Ambulatory Visit: Payer: BC Managed Care – HMO | Admitting: Internal Medicine

## 2014-08-22 ENCOUNTER — Encounter: Payer: BC Managed Care – HMO | Admitting: Internal Medicine

## 2014-08-26 ENCOUNTER — Ambulatory Visit: Payer: BC Managed Care – HMO | Admitting: Internal Medicine

## 2014-09-27 ENCOUNTER — Telehealth: Payer: Self-pay | Admitting: Internal Medicine

## 2014-09-27 MED ORDER — ACYCLOVIR 400 MG PO TABS: 400.0000 mg | ORAL_TABLET | Freq: Every day | ORAL | Status: DC

## 2014-09-27 NOTE — Telephone Encounter (Signed)
Pt request a rx for acyclovir 400 mg (3 month supply), refill at [facility] on [address]. Please advise pt.msn

## 2014-09-27 NOTE — Telephone Encounter (Signed)
Ok to refill,  Refill sent  

## 2014-09-27 NOTE — Telephone Encounter (Signed)
Pt.notified

## 2014-09-27 NOTE — Telephone Encounter (Signed)
Ok refill? Not on current med list

## 2014-10-11 ENCOUNTER — Other Ambulatory Visit: Payer: Self-pay

## 2014-10-28 ENCOUNTER — Encounter: Payer: Self-pay | Admitting: General Surgery

## 2015-01-10 ENCOUNTER — Encounter: Payer: BC Managed Care – HMO | Admitting: Internal Medicine

## 2015-02-14 ENCOUNTER — Encounter: Payer: Self-pay | Admitting: Internal Medicine

## 2015-03-03 ENCOUNTER — Encounter: Payer: Self-pay | Admitting: Internal Medicine

## 2015-03-03 ENCOUNTER — Ambulatory Visit (INDEPENDENT_AMBULATORY_CARE_PROVIDER_SITE_OTHER): Payer: No Typology Code available for payment source | Admitting: Internal Medicine

## 2015-03-03 VITALS — BP 142/82 | HR 56 | Temp 97.6°F | Resp 14 | Ht 62.5 in | Wt 194.8 lb

## 2015-03-03 DIAGNOSIS — E785 Hyperlipidemia, unspecified: Secondary | ICD-10-CM | POA: Diagnosis not present

## 2015-03-03 DIAGNOSIS — R5383 Other fatigue: Secondary | ICD-10-CM

## 2015-03-03 DIAGNOSIS — F419 Anxiety disorder, unspecified: Secondary | ICD-10-CM

## 2015-03-03 DIAGNOSIS — E0590 Thyrotoxicosis, unspecified without thyrotoxic crisis or storm: Secondary | ICD-10-CM

## 2015-03-03 DIAGNOSIS — E559 Vitamin D deficiency, unspecified: Secondary | ICD-10-CM

## 2015-03-03 DIAGNOSIS — E669 Obesity, unspecified: Secondary | ICD-10-CM

## 2015-03-03 DIAGNOSIS — Z1239 Encounter for other screening for malignant neoplasm of breast: Secondary | ICD-10-CM

## 2015-03-03 DIAGNOSIS — F5105 Insomnia due to other mental disorder: Secondary | ICD-10-CM

## 2015-03-03 DIAGNOSIS — Z0000 Encounter for general adult medical examination without abnormal findings: Secondary | ICD-10-CM

## 2015-03-03 LAB — COMPREHENSIVE METABOLIC PANEL
ALT: 12 U/L (ref 0–35)
AST: 17 U/L (ref 0–37)
Albumin: 4.5 g/dL (ref 3.5–5.2)
Alkaline Phosphatase: 55 U/L (ref 39–117)
BUN: 13 mg/dL (ref 6–23)
CO2: 30 mEq/L (ref 19–32)
Calcium: 9.7 mg/dL (ref 8.4–10.5)
Chloride: 102 mEq/L (ref 96–112)
Creatinine, Ser: 0.96 mg/dL (ref 0.40–1.20)
GFR: 75.09 mL/min (ref >60.00)
Glucose, Bld: 90 mg/dL (ref 70–99)
Potassium: 4.5 mEq/L (ref 3.5–5.1)
Sodium: 137 mEq/L (ref 135–145)
Total Bilirubin: 0.6 mg/dL (ref 0.2–1.2)
Total Protein: 7.4 g/dL (ref 6.0–8.3)

## 2015-03-03 LAB — LIPID PANEL
Cholesterol: 214 mg/dL — ABNORMAL HIGH (ref 0–200)
HDL: 61 mg/dL (ref >39.00)
LDL Cholesterol: 135 mg/dL — ABNORMAL HIGH (ref 0–99)
NonHDL: 153
Total CHOL/HDL Ratio: 4
Triglycerides: 91 mg/dL (ref 0.0–149.0)
VLDL: 18.2 mg/dL (ref 0.0–40.0)

## 2015-03-03 LAB — CBC WITH DIFFERENTIAL/PLATELET
Basophils Absolute: 0 10*3/uL (ref 0.0–0.1)
Basophils Relative: 0.5 % (ref 0.0–3.0)
Eosinophils Absolute: 0.1 10*3/uL (ref 0.0–0.7)
Eosinophils Relative: 0.9 % (ref 0.0–5.0)
HCT: 40.8 % (ref 36.0–46.0)
Hemoglobin: 13.4 g/dL (ref 12.0–15.0)
Lymphocytes Relative: 33.1 % (ref 12.0–46.0)
Lymphs Abs: 3 10*3/uL (ref 0.7–4.0)
MCHC: 32.8 g/dL (ref 30.0–36.0)
MCV: 84.8 fl (ref 78.0–100.0)
Monocytes Absolute: 0.4 10*3/uL (ref 0.1–1.0)
Monocytes Relative: 4.7 % (ref 3.0–12.0)
Neutro Abs: 5.4 10*3/uL (ref 1.4–7.7)
Neutrophils Relative %: 60.8 % (ref 43.0–77.0)
Platelets: 265 10*3/uL (ref 150.0–400.0)
RBC: 4.81 Mil/uL (ref 3.87–5.11)
RDW: 16.6 % — ABNORMAL HIGH (ref 11.5–15.5)
WBC: 8.9 10*3/uL (ref 4.0–10.5)

## 2015-03-03 MED ORDER — ALPRAZOLAM 0.5 MG PO TABS: 0.5000 mg | ORAL_TABLET | Freq: Every evening | ORAL | Status: DC | PRN

## 2015-03-03 NOTE — Progress Notes (Signed)
Pre-visit discussion using our clinic review tool. No additional management support is needed unless otherwise documented below in the visit note.  

## 2015-03-03 NOTE — Progress Notes (Signed)
Patient ID: Katelyn Friedman, female   DOB: 17-Feb-1951, 64 y.o.   MRN: [id number]  Subjective:     Katelyn Friedman is a 64 y.o. female and is here for a comprehensive physical exam. The patient reports insomnia and increased anxeity secondary to family conflicts between her husband and her sister in law who has moved in next door to her home of 20+ years .  History   Social History  . Marital Status: Married    Spouse Name: N/A  . Number of Children: N/A  . Years of Education: N/A   Occupational History  . Not on file.   Social History Main Topics  . Smoking status: Never Smoker   . Smokeless tobacco: Never Used  . Alcohol Use: No  . Drug Use: No  . Sexual Activity:    Partners: Male   Other Topics Concern  . Not on file   Social History Narrative   Married: 19 yrs   Regular exercise: walk   Caffeine use: none   Health Maintenance  Topic Date Due  . HIV Screening  06/28/1965  . ZOSTAVAX  06/28/2010  . INFLUENZA VACCINE  03/26/2017 (Originally 07/27/2014)  . MAMMOGRAM  06/05/2015  . TETANUS/TDAP  04/10/2024  . COLONOSCOPY  05/07/2024    The following portions of the patient's history were reviewed and updated as appropriate: allergies, current medications, past family history, past medical history, past social history, past surgical history and problem list.  Review of Systems  Patient denies headache, fevers, malaise, unintentional weight loss, skin rash, eye pain, sinus congestion and sinus pain, sore throat, dysphagia,  hemoptysis , cough, dyspnea, wheezing, chest pain, palpitations, orthopnea, edema, abdominal pain, nausea, melena, diarrhea, constipation, flank pain, dysuria, hematuria, urinary  Frequency, nocturia, numbness, tingling, seizures,  Focal weakness, Loss of consciousness,  Tremor, insomnia, depression, anxiety, and suicidal ideation.     Objective:  BP 142/82 mmHg  Pulse 56  Temp(Src) 97.6 F (36.4 C) (Oral)  Resp 14  Ht 5' 2.5" (1.588 m)  Wt 194  lb 12 oz (88.338 kg)  BMI 35.03 kg/m2  SpO2 99%   General appearance: alert, cooperative and appears stated age Head: Normocephalic, without obvious abnormality, atraumatic Eyes: conjunctivae/corneas clear. PERRL, EOM's intact. Fundi benign. Ears: normal TM's and external ear canals both ears Nose: Nares normal. Septum midline. Mucosa normal. No drainage or sinus tenderness. Throat: lips, mucosa, and tongue normal; teeth and gums normal Neck: no adenopathy, no carotid bruit, no JVD, supple, symmetrical, trachea midline and thyroid not enlarged, symmetric, no tenderness/mass/nodules Lungs: clear to auscultation bilaterally Breasts: normal appearance, no masses or tenderness Heart: regular rate and rhythm, S1, S2 normal, no murmur, click, rub or gallop Abdomen: soft, non-tender; bowel sounds normal; no masses,  no organomegaly Extremities: extremities normal, atraumatic, no cyanosis or edema Pulses: 2+ and symmetric Skin: Skin color, texture, turgor normal. No rashes or lesions Neurologic: Alert and oriented X 3, normal strength and tone. Normal symmetric reflexes. Normal coordination and gait.    Assessment and plan:   Problem List Items Addressed This Visit    Vitamin D deficiency   Relevant Orders   Vit D  25 hydroxy (rtn osteoporosis monitoring) (Completed)   Obesity (BMI 30-39.9)    I have addressed  BMI and recommended wt loss of 10% of body weigh over the next 6 months using a low glycemic index diet and regular exercise a minimum of 5 days per week.        Insomnia secondary  to anxiety    Alprazolam prn insomnia related to anxiety.  The risks and benefits of benzodiazepine use were discussed with patient today including excessive sedation leading to respiratory depression,  impaired thinking/driving, and addiction.  Patient was advised to avoid concurrent use with alcohol, to use medication only as needed and not to share with others  .        Relevant Medications    ALPRAZolam Duanne Moron) tablet   Encounter for preventive health examination    Annual comprehensive physical exam was done and management of acute and chronic conditions .  During the course of the visit the patient was educated and counseled about appropriate screening and preventive services including :  diabetes screening, lipid analysis with projected  10 year  risk for CAD , nutrition counseling, colorectal cancer screening, and recommended immunizations.  Printed recommendations for health maintenance screenings was given.                            Other Visit Diagnoses    Hyperlipidemia    -  Primary    Relevant Orders    Lipid panel (Completed)    Other fatigue        Relevant Orders    Comprehensive metabolic panel (Completed)    CBC with Differential/Platelet (Completed)    Hyperthyroidism        Relevant Orders    TSH (Completed)    Breast cancer screening        Relevant Orders    MM DIGITAL SCREENING BILATERAL

## 2015-03-03 NOTE — Patient Instructions (Signed)
I want you to lose 20 lbs this year, which will loer your BMI to 31.  (Our goal is to get it under 30 eventually)  Health Maintenance Adopting a healthy lifestyle and getting preventive care can go a long way to promote health and wellness. Talk with your health care provider about what schedule of regular examinations is right for you. This is a good chance for you to check in with your provider about disease prevention and staying healthy. In between checkups, there are plenty of things you can do on your own. Experts have done a lot of research about which lifestyle changes and preventive measures are most likely to keep you healthy. Ask your health care provider for more information. WEIGHT AND DIET  Eat a healthy diet  Be sure to include plenty of vegetables, fruits, low-fat dairy products, and lean protein.  Do not eat a lot of foods high in solid fats, added sugars, or salt.  Get regular exercise. This is one of the most important things you can do for your health.  Most adults should exercise for at least 150 minutes each week. The exercise should increase your heart rate and make you sweat (moderate-intensity exercise).  Most adults should also do strengthening exercises at least twice a week. This is in addition to the moderate-intensity exercise.  Maintain a healthy weight  Body mass index (BMI) is a measurement that can be used to identify possible weight problems. It estimates body fat based on height and weight. Your health care provider can help determine your BMI and help you achieve or maintain a healthy weight.  For females 58 years of age and older:   A BMI below 18.5 is considered underweight.  A BMI of 18.5 to 24.9 is normal.  A BMI of 25 to 29.9 is considered overweight.  A BMI of 30 and above is considered obese.  Watch levels of cholesterol and blood lipids  You should start having your blood tested for lipids and cholesterol at 65 years of age, then have  this test every 5 years.  You may need to have your cholesterol levels checked more often if:  Your lipid or cholesterol levels are high.  You are older than 65 years of age.  You are at high risk for heart disease.  CANCER SCREENING   Lung Cancer  Lung cancer screening is recommended for adults 24-63 years old who are at high risk for lung cancer because of a history of smoking.  A yearly low-dose CT scan of the lungs is recommended for people who:  Currently smoke.  Have quit within the past 15 years.  Have at least a 30-pack-year history of smoking. A pack year is smoking an average of one pack of cigarettes a day for 1 year.  Yearly screening should continue until it has been 15 years since you quit.  Yearly screening should stop if you develop a health problem that would prevent you from having lung cancer treatment.  Breast Cancer  Practice breast self-awareness. This means understanding how your breasts normally appear and feel.  It also means doing regular breast self-exams. Let your health care provider know about any changes, no matter how small.  If you are in your 20s or 30s, you should have a clinical breast exam (CBE) by a health care provider every 1-3 years as part of a regular health exam.  If you are 44 or older, have a CBE every year. Also consider having a breast  X-ray (mammogram) every year.  If you have a family history of breast cancer, talk to your health care provider about genetic screening.  If you are at high risk for breast cancer, talk to your health care provider about having an MRI and a mammogram every year.  Breast cancer gene (BRCA) assessment is recommended for women who have family members with BRCA-related cancers. BRCA-related cancers include:  Breast.  Ovarian.  Tubal.  Peritoneal cancers.  Results of the assessment will determine the need for genetic counseling and BRCA1 and BRCA2 testing. Cervical Cancer Routine pelvic  examinations to screen for cervical cancer are no longer recommended for nonpregnant women who are considered low risk for cancer of the pelvic organs (ovaries, uterus, and vagina) and who do not have symptoms. A pelvic examination may be necessary if you have symptoms including those associated with pelvic infections. Ask your health care provider if a screening pelvic exam is right for you.   The Pap test is the screening test for cervical cancer for women who are considered at risk.  If you had a hysterectomy for a problem that was not cancer or a condition that could lead to cancer, then you no longer need Pap tests.  If you are older than 65 years, and you have had normal Pap tests for the past 10 years, you no longer need to have Pap tests.  If you have had past treatment for cervical cancer or a condition that could lead to cancer, you need Pap tests and screening for cancer for at least 20 years after your treatment.  If you no longer get a Pap test, assess your risk factors if they change (such as having a new sexual partner). This can affect whether you should start being screened again.  Some women have medical problems that increase their chance of getting cervical cancer. If this is the case for you, your health care provider may recommend more frequent screening and Pap tests.  The human papillomavirus (HPV) test is another test that may be used for cervical cancer screening. The HPV test looks for the virus that can cause cell changes in the cervix. The cells collected during the Pap test can be tested for HPV.  The HPV test can be used to screen women 14 years of age and older. Getting tested for HPV can extend the interval between normal Pap tests from three to five years.  An HPV test also should be used to screen women of any age who have unclear Pap test results.  After 65 years of age, women should have HPV testing as often as Pap tests.  Colorectal Cancer  This type of  cancer can be detected and often prevented.  Routine colorectal cancer screening usually begins at 65 years of age and continues through 65 years of age.  Your health care provider may recommend screening at an earlier age if you have risk factors for colon cancer.  Your health care provider may also recommend using home test kits to check for hidden blood in the stool.  A small camera at the end of a tube can be used to examine your colon directly (sigmoidoscopy or colonoscopy). This is done to check for the earliest forms of colorectal cancer.  Routine screening usually begins at age 26.  Direct examination of the colon should be repeated every 5-10 years through 65 years of age. However, you may need to be screened more often if early forms of precancerous polyps or small  growths are found. Skin Cancer  Check your skin from head to toe regularly.  Tell your health care provider about any new moles or changes in moles, especially if there is a change in a mole's shape or color.  Also tell your health care provider if you have a mole that is larger than the size of a pencil eraser.  Always use sunscreen. Apply sunscreen liberally and repeatedly throughout the day.  Protect yourself by wearing long sleeves, pants, a wide-brimmed hat, and sunglasses whenever you are outside. HEART DISEASE, DIABETES, AND HIGH BLOOD PRESSURE   Have your blood pressure checked at least every 1-2 years. High blood pressure causes heart disease and increases the risk of stroke.  If you are between 26 years and 28 years old, ask your health care provider if you should take aspirin to prevent strokes.  Have regular diabetes screenings. This involves taking a blood sample to check your fasting blood sugar level.  If you are at a normal weight and have a low risk for diabetes, have this test once every three years after 65 years of age.  If you are overweight and have a high risk for diabetes, consider being  tested at a younger age or more often. PREVENTING INFECTION  Hepatitis B  If you have a higher risk for hepatitis B, you should be screened for this virus. You are considered at high risk for hepatitis B if:  You were born in a country where hepatitis B is common. Ask your health care provider which countries are considered high risk.  Your parents were born in a high-risk country, and you have not been immunized against hepatitis B (hepatitis B vaccine).  You have HIV or AIDS.  You use needles to inject street drugs.  You live with someone who has hepatitis B.  You have had sex with someone who has hepatitis B.  You get hemodialysis treatment.  You take certain medicines for conditions, including cancer, organ transplantation, and autoimmune conditions. Hepatitis C  Blood testing is recommended for:  Everyone born from [birth year] through 1965.  Anyone with known risk factors for hepatitis C. Sexually transmitted infections (STIs)  You should be screened for sexually transmitted infections (STIs) including gonorrhea and chlamydia if:  You are sexually active and are younger than 65 years of age.  You are older than 65 years of age and your health care provider tells you that you are at risk for this type of infection.  Your sexual activity has changed since you were last screened and you are at an increased risk for chlamydia or gonorrhea. Ask your health care provider if you are at risk.  If you do not have HIV, but are at risk, it may be recommended that you take a prescription medicine daily to prevent HIV infection. This is called pre-exposure prophylaxis (PrEP). You are considered at risk if:  You are sexually active and do not regularly use condoms or know the HIV status of your partner(s).  You take drugs by injection.  You are sexually active with a partner who has HIV. Talk with your health care provider about whether you are at high risk of being infected with HIV. If  you choose to begin PrEP, you should first be tested for HIV. You should then be tested every 3 months for as long as you are taking PrEP.  PREGNANCY   If you are premenopausal and you may become pregnant, ask your health care provider about preconception counseling.  If you may become pregnant, take 400 to 800 micrograms (mcg) of folic acid every day.  If you want to prevent pregnancy, talk to your health care provider about birth control (contraception). OSTEOPOROSIS AND MENOPAUSE   Osteoporosis is a disease in which the bones lose minerals and strength with aging. This can result in serious bone fractures. Your risk for osteoporosis can be identified using a bone density scan.  If you are 49 years of age or older, or if you are at risk for osteoporosis and fractures, ask your health care provider if you should be screened.  Ask your health care provider whether you should take a calcium or vitamin D supplement to lower your risk for osteoporosis.  Menopause may have certain physical symptoms and risks.  Hormone replacement therapy may reduce some of these symptoms and risks. Talk to your health care provider about whether hormone replacement therapy is right for you.  HOME CARE INSTRUCTIONS   Schedule regular health, dental, and eye exams.  Stay current with your immunizations.   Do not use any tobacco products including cigarettes, chewing tobacco, or electronic cigarettes.  If you are pregnant, do not drink alcohol.  If you are breastfeeding, limit how much and how often you drink alcohol.  Limit alcohol intake to no more than 1 drink per day for nonpregnant women. One drink equals 12 ounces of beer, 5 ounces of wine, or 1 ounces of hard liquor.  Do not use street drugs.  Do not share needles.  Ask your health care provider for help if you need support or information about quitting drugs.  Tell your health care provider if you often feel depressed.  Tell your health  care provider if you have ever been abused or do not feel safe at home. Document Released: 06/28/2011 Document Revised: 04/29/2014 Document Reviewed: 11/14/2013 [facility] Patient Information 2015 [place], [place]. This information is not intended to replace advice given to you by your health care provider. Make sure you discuss any questions you have with your health care provider.

## 2015-03-04 LAB — TSH: TSH: 2.07 u[IU]/mL (ref 0.35–4.50)

## 2015-03-04 LAB — VITAMIN D 25 HYDROXY (VIT D DEFICIENCY, FRACTURES): VITD: 21.75 ng/mL — ABNORMAL LOW (ref 30.00–100.00)

## 2015-03-05 ENCOUNTER — Encounter: Payer: Self-pay | Admitting: Internal Medicine

## 2015-03-05 DIAGNOSIS — Z0000 Encounter for general adult medical examination without abnormal findings: Secondary | ICD-10-CM | POA: Insufficient documentation

## 2015-03-05 NOTE — Assessment & Plan Note (Signed)
Alprazolam prn insomnia related to anxiety.  The risks and benefits of benzodiazepine use were discussed with patient today including excessive sedation leading to respiratory depression,  impaired thinking/driving, and addiction.  Patient was advised to avoid concurrent use with alcohol, to use medication only as needed and not to share with others  .

## 2015-03-05 NOTE — Assessment & Plan Note (Signed)
I have addressed  BMI and recommended wt loss of 10% of body weigh over the next 6 months using a low glycemic index diet and regular exercise a minimum of 5 days per week.   

## 2015-03-05 NOTE — Assessment & Plan Note (Signed)
Annual comprehensive physical exam was done and management of acute and chronic conditions .  During the course of the visit the patient was educated and counseled about appropriate screening and preventive services including :  diabetes screening, lipid analysis with projected  10 year  risk for CAD , nutrition counseling, colorectal cancer screening, and recommended immunizations.  Printed recommendations for health maintenance screenings was given.

## 2015-03-06 ENCOUNTER — Other Ambulatory Visit: Payer: Self-pay | Admitting: Internal Medicine

## 2015-03-06 ENCOUNTER — Encounter: Payer: Self-pay | Admitting: Internal Medicine

## 2015-03-06 MED ORDER — ERGOCALCIFEROL 1.25 MG (50000 UT) PO CAPS: 50000.0000 [IU] | ORAL_CAPSULE | ORAL | Status: DC

## 2015-03-17 ENCOUNTER — Ambulatory Visit: Payer: No Typology Code available for payment source | Admitting: Internal Medicine

## 2015-04-01 ENCOUNTER — Encounter: Payer: Self-pay | Admitting: Internal Medicine

## 2015-04-01 NOTE — Progress Notes (Signed)
Mailed patient a Unread my chart message.

## 2015-04-04 NOTE — Telephone Encounter (Signed)
Mailed Patient a copy of unread my chart message on 04/04/2015.

## 2015-04-18 NOTE — Discharge Summary (Signed)
PATIENT NAME:  Katelyn Friedman, BARILLAS MR#:  [id number] DATE OF BIRTH:  September 06, 1951  DATE OF ADMISSION:  07/20/2013 DATE OF DISCHARGE:  07/21/2013  PRIMARY CARE PHYSICIAN: Deborra Medina, MD  FINAL DIAGNOSES: 1.  Hyperthyroidism and thyroid nodule.  2.  Palpitations, tachycardia, numbness.   MEDICATIONS ON DISCHARGE: Include calcium and vitamin D 1 tablet twice a day, aspirin 81 mg every other day, vitamin D3 at 50,000 units once a week on Saturday, methimazole 5 mg every 8 hours.  FOLLOWUP: With endocrinology, Dr. Ronnald Collum, as outpatient and inn 1 to 2 weeks with Dr. Derrel Nip.   DIET: Regular diet.   HOSPITAL COURSE: The patient was admitted as an observation on 07/20/2013. Came in with left arm numbness, facial numbness. Admitted as an observation to rule out TIA, but symptoms could be all secondary to hyperthyroidism.   LABORATORY AND RADIOLOGICAL DATA DURING THE HOSPITAL COURSE: Included a chest x-ray that showed no acute abnormality. Liver function tests normal. Magnesium 1.8. T3 by RIA is 215, which is a little elevated. Free thyroxine 1.64, which is a little bit elevated. D-dimer 0.49. Troponin negative. TSH less than 0.01. Glucose 93, BUN 14, creatinine 0.72, sodium 142, potassium 4.2, chloride 109, CO2 of 28, calcium 9.1. White blood cell count 6.5, hemoglobin and hematocrit 12.3 and 35.8, platelet count of 246. CT scan of the head showed no acute intracranial process. CT scan of the chest showed no evidence of pulmonary embolism. Thyroid-stimulating immunoglobulin result pending. Thyroid antimicrosomal antibody, which is thyroid peroxidase antibody, elevated at 893. Thyroid antithyroglobulin antibody result pending. Ultrasound of the thyroid showed a possible 7 mm nodule, left lobe of the thyroid. LDL 107, HDL 54, triglycerides 113. Repeat CT scan of the head was negative.  HOSPITAL COURSE PER PROBLEM LIST: For the patient's hyperthyroidism and thyroid nodule, I did recommend following up with  endocrinology. Since it is the weekend, we do not have endocrinology service available. Would like to get a thyroid uptake and scan but since the patient had a CT scan with contrast, unable to do that for 6 weeks, so we will start methimazole treatment to settle down the hyperthyroidism and follow up with endocrinology as outpatient. The ultrasound was read as a possible thyroid nodule. Thyroid uptake and scan will better determine this. The patient's thyroid was nontender to palpation, so I do not think this is an acute thyroiditis. I think all the patient's symptoms are probably attributed to the hyperthyroidism, including the palpitations, tachycardia and numbness. I do not believe that this is a stroke. Two CAT scans were negative. The patient does take an aspirin every other day, which is fine. I did speak with Dr. Derrel Nip. Upon discharge, followup is definitely necessary. Most likely can lower the dose of the methimazole down in about a week.   TIME SPENT ON DISCHARGE: 35 minutes.   ____________________________ Tana Conch. Leslye Peer, MD rjw:jm D: 07/21/2013 15:25:11 ET T: 07/21/2013 16:45:25 ET JOB#: [id number]  cc: Tana Conch. Leslye Peer, MD, <Dictator> Deborra Medina, MD Marisue Brooklyn MD ELECTRONICALLY SIGNED 07/23/2013 14:58

## 2015-04-18 NOTE — H&P (Signed)
PATIENT NAME:  Katelyn Friedman, Katelyn Friedman MR#:  [id number] DATE OF BIRTH:  1951/04/03  DATE OF ADMISSION:  07/20/2013  REASON FOR ADMISSION: Numbness of the left upper extremity, tingling at the level of the legs and occasional palpitations.   PRIMARY CARE PHYSICIAN: Deborra Medina, MD.   REFERRING PHYSICIAN: Shirlyn Goltz, MD.  HISTORY OF PRESENT ILLNESS:  The patient is a very nice 64 year old female who has history of being healthy most of her life. On one of her annual physical exams, she was told that her thyroid gland was hyperfunctioning. Dr. Deborra Medina was planning on rechecking her  functional levels to make a decision as far as treatment, waiting a month, which is about this time.   The patient says that she has been in her normal state of health. No significant stress, change of  stressors of her life, but she was really tired last night prior to being on her normal state of health. She "didn't feel right." This morning, when she woke up, she was feeling foggy headed. She had facial numbness at the level of the lips and left arm tingling and numbness.   The tingling and numbness persists in decreasing intensity but is still persistent.   The patient has full strength and she has not had any problems like dropping things or having uncoordination of her arm. She just felt like it is numb.  She states that she has been having palpitations very often. Mostly, she can notice them whenever she is resting. She does not feel like they are associated with any type of activity. They could happen when she is doing activity or when resting, but she notices them more as resting, because she feels like she is putting more attention to them.   She wakes up 3 times at night to go to the bathroom and she can feel the palpitations then, although they are not waking her up.   She denies any headache, weight loss. She says that she probably had 3 pounds weight gain. She states that her hair been falling, but she  cannot tell that it is more than usual. She denies any diarrhea. She has actually being constipated. She denies any diaphoresis or drier skin. She notices some joint pain occasionally.   The patient is admitted for her persistent left upper extremity numbness, but she has a significant low TSH and an elevated T4. On one of her EKGs on admission, the first one, she was slightly tachycardic and her P waves were enlarged more than 3 mm total. The patient denies any significant cardiovascular disease.   Repeated EKG actually showed less voltage of those P waves, which could be suggestive of lead placement problems, although as the patient was tachycardic and there was a change, I am concerned for increase in pulmonary pressures. Her D-dimer is 0.49, which is pretty much normal and she does not have any hypoxia, for what we are not going to pursue the diagnosis of pulmonary embolism.   REVIEW OF SYSTEMS:  Please refer to HPI, but:  CONSTITUTIONAL: Positive fatigue, tiredness. No weight loss. Positive weight gain.  EYES: No blurry vision, double vision or glaucoma.  ENT: No difficulty swallowing. No epistaxis. No sinus pain.  RESPIRATORY: No cough, wheezing, COPD, painful respirations or dyspnea.  CARDIOVASCULAR: Positive palpitations. Negative chest pain. Negative orthopnea. Negative arrhythmia. Negative syncope.  GASTROINTESTINAL: No nausea, vomiting, abdominal pain, constipation or diarrhea.  GENITOURINARY: No dysuria, hematuria or increased frequency. No vaginal discharge. Positive breast mass with biopsy,  which was negative for malignancy.  ENDOCRINE: No polyuria, polydipsia or polyphagia. No cold or heat intolerance. Please refer to HPI as well.  HEMATOLOGIC: No anemia, easy bruising or swollen glands.  SKIN: No rashes, petechiae or changes in moles.  MUSCULOSKELETAL: No significant neck pain, back pain, gout or redness.  NEUROLOGIC: No numbness, tingling or ataxia.  PSYCHIATRIC: No significant  anxiety or insomnia or nervousness.   PHYSICAL EXAMINATION:  VITAL SIGNS: Blood pressure of 138/64, pulse 67, respiratory rate 20, temperature 97.9.  CARDIAC: As mentioned above, she was tachycardic at the first EKG around 102.  GENERAL: The patient is alert and oriented x 3. No acute distress. No respiratory distress. Hemodynamically stable.  HEENT: Pupils are equal and reactive. Extraocular movements are intact. Mucosae are moist. Anicteric sclerae. Pink conjunctivae. No oral lesions. No oropharyngeal exudates. No masses.  NECK: Supple. No JVD. No thyromegaly. No adenopathy. No carotid bruits. Again, thyroid is not palpable even whenever we asked the patient to swallow. No nodular findings on the neck like  thyroid nodularities, and there is no bruit on the anterior neck related to hyperfunctioning thyroid.  EYES: No lid lag or exophthalmos.  CARDIOVASCULAR: Regular rate and rhythm. No murmurs, rubs or gallops. No displacement of PMI.  LUNGS: Clear without any wheezing or crepitus. No use of accessory muscles.  ABDOMEN: Soft, nontender, nondistended. No hepatosplenomegaly. No masses. Bowel sounds are positive.  GENITAL: Deferred.  EXTREMITIES: No edema, cyanosis or clubbing. Pulses +2. Capillary refill less than 3. DTR +2.  NEUROLOGIC: Cranial nerves II through XII intact. Strength is equal in 4 extremities. The patient has subjective numbness at the level of the lips, of  left upper extremity which has improved though persists. Finger-to-nose test normal. Rapid alternating movements are normal. Overall no focal findings on neurologic exam.  PSYCHIATRIC: Negative for agitation. The patient is alert, oriented x 3.  MUSCULOSKELETAL: No significant joint deformity. Strength is equal. Tone is equal in both extremities.  SKIN: No rashes, petechiae or diaphoresis.  LYMPHATIC: Negative for lymphadenopathy in neck or supraclavicular areas.  VASCULAR: Good pulses. Capillary refill less than 3. Good  sensation distally.   LABORATORY, DIAGNOSTIC AND RADIOLOGICAL DATA: Normal electrolytes. Sodium 142, potassium 4.2, creatinine 0.72, chloride 109. Anion gap is less than 5. TSH is 0.01, which is very low. Thyroxine free is 1.64 which is slightly high. Troponin is negative. White count 6.5, hemoglobin 12, platelet count 246. D-dimer is slightly elevated at 0.49.   CT scan of the head without contrast: No acute abnormalities.   CT scan of the chest with contrast to rule out PE: No evidence of pulmonary embolus.   ASSESSMENT AND PLAN:  6.  A 64 year old female admitted with left arm numbness and lip or facial numbness. The patient admitted for rule out transient ischemic attack versus stroke. Symptoms have been resolving. At this moment, she has a gamut of symptoms, tachycardia, palpitations, and numbness that might be related, all this, to hyperthyroidism. We are going to do an MRI just to rule out the possibility of that transient ischemic attack versus stroke as the patient has persistent symptoms. I think the symptoms might be related mostly her hyperthyroidism; but on the sense of the symptoms improving slightly but not resolving, we are going to go ahead and rule out the possibility of cerebrovascular accident or transient ischemic attack.  2.  As far as her thyroid, the patient has elevated T4, decreased TSH. We are going to get workup for Graves's disease including  peroxidase antibody and a stimulant antibody of the thyroid glands.  3.  On top of that, I would like to get an iodine uptake scan, but unfortunately the patient had  contrast today for a CT scan to rule out pulmonary embolism, and she has to wait a month until getting an uptake scan for what we are only going to do on an ultrasound of the neck.  4.  I  offered the patient to see Dr. Lavone Orn, Endocrinology, but the patient was concerned  about it because of her insurance. She will be afraid that the insurance will not pay for it, as  Dr. Gabriel Carina belongs to [employer] and her insurance only covers [employer]. I am not 100% sure of this, but I think that since this hospitalization on the system with [facility], that should be okay, but the patient states that she would rather see somebody from [facility].  5.  Elevation of enlarged P waves, bilateral atrial enlargement. This could be both changes related to tachycardia; although just to make sure, we are going to get an echocardiogram.  6.  Other than that, she is stable. We are going to admit her for evaluation of these symptoms. The patient seems to be in good condition. We are going to monitor closely and hopefully discharge tomorrow.  7.  CODE STATUS: FULL CODE.  8.  Gastrointestinal prophylaxis with Protonix. Deep vein thrombosis prophylaxis with heparin and plcs.   I spent about 45 minutes with this patient.   ____________________________ [place] Sink, MD rsg:np D: 07/20/2013 16:32:00 ET T: 07/20/2013 17:59:43 ET JOB#: [id number]  cc: Raysal Sink, MD, <Dictator> Kolten Ryback America Brown MD ELECTRONICALLY SIGNED 07/22/2013 0:35

## 2015-04-18 NOTE — H&P (Signed)
PATIENT NAME:  Katelyn Friedman, Katelyn Friedman MR#:  [id number] DATE OF BIRTH:  May 08, 1951  DATE OF ADMISSION:  07/20/2013  Addendum  Unable to do an MRI of the brain due to patient having metal in her breast after she went for a breast biopsy. There was a metal marker that has been recently placed within the last month, for which she cannot have the MRI done.   We are going to repeat a CT scan of the head tomorrow, within the next 24 hours.    ____________________________ [place] Sink, MD rsg:jm D: 07/20/2013 17:28:46 ET T: 07/20/2013 18:34:16 ET JOB#: [id number]  cc: Wilder Sink, MD, <Dictator> Precilla Purnell America Brown MD ELECTRONICALLY SIGNED 07/22/2013 0:35

## 2015-08-22 ENCOUNTER — Telehealth: Payer: Self-pay | Admitting: Internal Medicine

## 2015-08-22 NOTE — Telephone Encounter (Signed)
Pt called about having some pain in her right breast.

## 2015-08-25 ENCOUNTER — Telehealth: Payer: Self-pay | Admitting: Internal Medicine

## 2015-08-25 NOTE — Telephone Encounter (Signed)
LMTCB for appt details for mammo at [facility] on 8/31 at 11am.

## 2015-08-27 ENCOUNTER — Ambulatory Visit
Admission: RE | Admit: 2015-08-27 | Discharge: 2015-08-27 | Disposition: A | Discharge status: Home / Self Care | Payer: PPO | Source: Ambulatory Visit | Attending: Internal Medicine | Admitting: Internal Medicine

## 2015-08-27 DIAGNOSIS — Z1231 Encounter for screening mammogram for malignant neoplasm of breast: Secondary | ICD-10-CM | POA: Diagnosis present

## 2015-08-27 DIAGNOSIS — Z1239 Encounter for other screening for malignant neoplasm of breast: Secondary | ICD-10-CM

## 2015-09-29 ENCOUNTER — Other Ambulatory Visit: Payer: Self-pay | Admitting: Internal Medicine

## 2015-09-30 NOTE — Telephone Encounter (Signed)
Please advise refill?  Last OV was 03/17/15

## 2015-10-01 NOTE — Telephone Encounter (Signed)
90 day supply authorized and sent   

## 2015-12-01 ENCOUNTER — Telehealth: Payer: Self-pay | Admitting: Internal Medicine

## 2015-12-01 NOTE — Telephone Encounter (Signed)
Spoke with the Patient, [place], nervous that she isn't feeling better.  Scheduled for appointment on Thursday with Dr. Derrel Nip, she will call on Wednesday to confirm if it is needed or not.

## 2015-12-01 NOTE — Telephone Encounter (Signed)
[facility] Patient Name: Katelyn Friedman DOB: April 11, 1951 Initial Comment Caller states she is going out of the country friday. She had vomiting and diarrhea last week and is still feeling sluggish. Nurse Assessment Nurse: Markus Daft, RN, Sherre Poot Date/Time (Eastern Time): 12/01/2015 9:02:07 AM Confirm and document reason for call. If symptomatic, describe symptoms. ---Caller states she had vomiting/diarrhea last week, last episodes on Thursday. No fever. She still feels sluggish - dragging and a little weak. She is back to eating a normal diet, just not as much. -- She is going out of the country Friday. Has the patient traveled out of the country within the last 30 days? ---Not Applicable Does the patient have any new or worsening symptoms? ---Yes Will a triage be completed? ---Yes Related visit to physician within the last 2 weeks? ---No Does the PT have any chronic conditions? (i.e. diabetes, asthma, etc.) ---No Is this a behavioral health or substance abuse call? ---No Guidelines Guideline Title Affirmed Question Affirmed Notes Weakness (Generalized) and Fatigue Mild weakness or fatigue with acute minor illness (e.g., colds) (all triage questions negative) Final Disposition User [place], RN, Windy Disagree/Comply: Comply

## 2015-12-02 ENCOUNTER — Encounter: Payer: Self-pay | Admitting: Nurse Practitioner

## 2015-12-02 ENCOUNTER — Ambulatory Visit (INDEPENDENT_AMBULATORY_CARE_PROVIDER_SITE_OTHER): Payer: PPO | Admitting: Nurse Practitioner

## 2015-12-02 VITALS — BP 128/70 | HR 65 | Temp 98.1°F | Wt 196.0 lb

## 2015-12-02 DIAGNOSIS — R197 Diarrhea, unspecified: Secondary | ICD-10-CM | POA: Diagnosis not present

## 2015-12-02 LAB — CBC WITH DIFFERENTIAL/PLATELET
Basophils Absolute: 0 10*3/uL (ref 0.0–0.1)
Basophils Relative: 0.6 % (ref 0.0–3.0)
Eosinophils Absolute: 0.1 10*3/uL (ref 0.0–0.7)
Eosinophils Relative: 1.2 % (ref 0.0–5.0)
HCT: 40.1 % (ref 36.0–46.0)
Hemoglobin: 12.9 g/dL (ref 12.0–15.0)
Lymphocytes Relative: 27.9 % (ref 12.0–46.0)
Lymphs Abs: 2.2 10*3/uL (ref 0.7–4.0)
MCHC: 32.1 g/dL (ref 30.0–36.0)
MCV: 83.8 fl (ref 78.0–100.0)
Monocytes Absolute: 0.4 10*3/uL (ref 0.1–1.0)
Monocytes Relative: 5.5 % (ref 3.0–12.0)
Neutro Abs: 5.1 10*3/uL (ref 1.4–7.7)
Neutrophils Relative %: 64.8 % (ref 43.0–77.0)
Platelets: 282 10*3/uL (ref 150.0–400.0)
RBC: 4.78 Mil/uL (ref 3.87–5.11)
RDW: 15.8 % — ABNORMAL HIGH (ref 11.5–15.5)
WBC: 7.9 10*3/uL (ref 4.0–10.5)

## 2015-12-02 LAB — COMPREHENSIVE METABOLIC PANEL
ALT: 16 U/L (ref 0–35)
AST: 20 U/L (ref 0–37)
Albumin: 3.9 g/dL (ref 3.5–5.2)
Alkaline Phosphatase: 53 U/L (ref 39–117)
BUN: 10 mg/dL (ref 6–23)
CO2: 29 mEq/L (ref 19–32)
Calcium: 9.2 mg/dL (ref 8.4–10.5)
Chloride: 106 mEq/L (ref 96–112)
Creatinine, Ser: 0.85 mg/dL (ref 0.40–1.20)
GFR: 86.21 mL/min (ref >60.00)
Glucose, Bld: 97 mg/dL (ref 70–99)
Potassium: 3.9 mEq/L (ref 3.5–5.1)
Sodium: 142 mEq/L (ref 135–145)
Total Bilirubin: 0.6 mg/dL (ref 0.2–1.2)
Total Protein: 6.8 g/dL (ref 6.0–8.3)

## 2015-12-02 MED ORDER — ONDANSETRON HCL 4 MG PO TABS: 4.0000 mg | ORAL_TABLET | Freq: Three times a day (TID) | ORAL | Status: DC | PRN

## 2015-12-02 NOTE — Patient Instructions (Addendum)
Imodium for soft/watery stools, soft bland foods, and zofran as needed for nausea  Diarrhea Diarrhea is frequent loose and watery bowel movements. It can cause you to feel weak and dehydrated. Dehydration can cause you to become tired and thirsty, have a dry mouth, and have decreased urination that often is dark yellow. Diarrhea is a sign of another problem, most often an infection that will not last long. In most cases, diarrhea typically lasts 2-3 days. However, it can last longer if it is a sign of something more serious. It is important to treat your diarrhea as directed by your caregiver to lessen or prevent future episodes of diarrhea. CAUSES  Some common causes include:  Gastrointestinal infections caused by viruses, bacteria, or parasites.  Food poisoning or food allergies.  Certain medicines, such as antibiotics, chemotherapy, and laxatives.  Artificial sweeteners and fructose.  Digestive disorders. HOME CARE INSTRUCTIONS  Ensure adequate fluid intake (hydration): Have 1 cup (8 oz) of fluid for each diarrhea episode. Avoid fluids that contain simple sugars or sports drinks, fruit juices, whole milk products, and sodas. Your urine should be clear or pale yellow if you are drinking enough fluids. Hydrate with an oral rehydration solution that you can purchase at pharmacies, retail stores, and online. You can prepare an oral rehydration solution at home by mixing the following ingredients together:   - tsp table salt.   tsp baking soda.   tsp salt substitute containing potassium chloride.  1  tablespoons sugar.  1 L (34 oz) of water.  Certain foods and beverages may increase the speed at which food moves through the gastrointestinal (GI) tract. These foods and beverages should be avoided and include:  Caffeinated and alcoholic beverages.  High-fiber foods, such as raw fruits and vegetables, nuts, seeds, and whole grain breads and cereals.  Foods and beverages sweetened with  sugar alcohols, such as xylitol, sorbitol, and mannitol.  Some foods may be well tolerated and may help thicken stool including:  Starchy foods, such as rice, toast, pasta, low-sugar cereal, oatmeal, grits, baked potatoes, crackers, and bagels.  Bananas.  Applesauce.  Add probiotic-rich foods to help increase healthy bacteria in the GI tract, such as yogurt and fermented milk products.  Wash your hands well after each diarrhea episode.  Only take over-the-counter or prescription medicines as directed by your caregiver.  Take a warm bath to relieve any burning or pain from frequent diarrhea episodes. SEEK IMMEDIATE MEDICAL CARE IF:   You are unable to keep fluids down.  You have persistent vomiting.  You have blood in your stool, or your stools are black and tarry.  You do not urinate in 6-8 hours, or there is only a small amount of very dark urine.  You have abdominal pain that increases or localizes.  You have weakness, dizziness, confusion, or light-headedness.  You have a severe headache.  Your diarrhea gets worse or does not get better.  You have a fever or persistent symptoms for more than 2-3 days.  You have a fever and your symptoms suddenly get worse. MAKE SURE YOU:   Understand these instructions.  Will watch your condition.  Will get help right away if you are not doing well or get worse.   This information is not intended to replace advice given to you by your health care provider. Make sure you discuss any questions you have with your health care provider.   Document Released: 12/03/2002 Document Revised: 01/03/2015 Document Reviewed: 08/20/2012 Elsevier Interactive Patient Education 2016  [employer].

## 2015-12-02 NOTE — Progress Notes (Signed)
Patient ID: Katelyn Friedman, female    DOB: Nov 29, 1951  Age: 64 y.o. MRN: [id number]  CC: Nausea   HPI [name] presents for CC of Nausea x 9 days.  1) Since last Monday- was working at school, stomach rumbled and threw up and diarrhea happened for 2.5 days.   Every 30 min for the first day, 3-4 x the next day.   Stomach pains- generalized Soft and brown- been once today, 3 x yesterday.   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   She has past surgical history that includes Rotator cuff repair (2006); Knee arthroscopy (1992); Tubal ligation; Tonsillectomy; Colonoscopy (2006); and Breast biopsy (Right, 2014).   Her family history includes Cancer in her maternal aunt; Colon polyps in her maternal aunt; Dementia in her father; Stroke in her mother.She reports that she has never smoked. She has never used smokeless tobacco. She reports that she does not drink alcohol or use illicit drugs.  Outpatient Prescriptions Prior to Visit  Medication Sig Dispense Refill  . acyclovir (ZOVIRAX) 400 MG tablet TAKE ONE TABLET BY MOUTH ONCE DAILY 90 tablet 1  . albuterol (PROVENTIL HFA;VENTOLIN HFA) 108 (90 BASE) MCG/ACT inhaler Inhale 2 puffs into the lungs every 6 (six) hours as needed for wheezing or shortness of breath. 1 Inhaler 2  . ALPRAZolam (XANAX) 0.5 MG tablet Take 1 tablet (0.5 mg total) by mouth at bedtime as needed for anxiety or sleep. 30 tablet 2  . aspirin 81 MG tablet  Take 81 mg by mouth every other day.    . Cholecalciferol (VITAMIN D-3) 1000 UNITS CAPS Take 1 capsule by mouth daily.    . Esomeprazole Magnesium (NEXIUM PO) Take by mouth.    . ergocalciferol (DRISDOL) 50000 UNITS capsule Take 1 capsule (50,000 Units total) by mouth once a week. (Patient not taking: Reported on 12/02/2015) 4 capsule 0   No facility-administered medications prior to visit.    ROS Review of Systems  Constitutional: Positive for fever, chills, diaphoresis and fatigue.  HENT: Positive for sore throat. Negative for congestion and trouble swallowing.   Eyes: Negative for visual disturbance.  Respiratory: Negative for cough, chest tightness, shortness of breath and wheezing.   Cardiovascular: Negative for chest pain, palpitations and leg swelling.  Gastrointestinal: Positive for nausea, vomiting, abdominal pain and diarrhea. Negative for constipation, blood in stool, abdominal distention, anal bleeding and rectal pain.  Skin: Negative for rash.    Objective:  BP 128/70 mmHg  Pulse 65  Temp(Src) 98.1 F (36.7 C) (Oral)  Wt 196 lb (88.905 kg)  SpO2 97%  Physical Exam  Constitutional: She is oriented to person, place, and time. She appears well-developed and well-nourished. No distress.  HENT:  Head: Normocephalic and atraumatic.  Right Ear: External ear normal.  Left Ear: External ear normal.  Cardiovascular: Normal rate, regular rhythm and normal heart sounds.  Exam reveals no gallop and no friction rub.   No murmur heard. Pulmonary/Chest:  Effort normal and breath sounds normal. No respiratory distress. She has no wheezes. She has no rales. She exhibits no tenderness.  Abdominal: Soft. Bowel sounds are normal. She exhibits no distension and no mass. There is no tenderness. There is no rebound and no guarding.  Neurological: She is alert and oriented to person, place, and time. No cranial nerve deficit. She exhibits normal muscle tone. Coordination normal.  Skin: Skin  is warm and dry. No rash noted. She is not diaphoretic.  Psychiatric: She has a normal mood and affect. Her behavior is normal. Judgment and thought content normal.      Assessment & Plan:   Mckenze was seen today for nausea.  Diagnoses and all orders for this visit:  Diarrhea, unspecified type -     Comp Met (CMET) -     CBC w/Diff  Other orders -     ondansetron (ZOFRAN) 4 MG tablet; Take 1 tablet (4 mg total) by mouth every 8 (eight) hours as needed for nausea or vomiting.   I am having Ms. Saraceni start on ondansetron. I am also having her maintain her aspirin, albuterol, Vitamin D-3, Esomeprazole Magnesium (NEXIUM PO), ALPRAZolam, ergocalciferol, and acyclovir.  Meds ordered this encounter  Medications  . ondansetron (ZOFRAN) 4 MG tablet    Sig: Take 1 tablet (4 mg total) by mouth every 8 (eight) hours as needed for nausea or vomiting.    Dispense:  20 tablet    Refill:  0    Order Specific Question:  Supervising Provider    Answer:  [name] [[id number]]     Follow-up: Return if symptoms worsen or fail to improve.

## 2015-12-04 ENCOUNTER — Ambulatory Visit: Payer: PPO | Admitting: Internal Medicine

## 2015-12-10 DIAGNOSIS — R197 Diarrhea, unspecified: Secondary | ICD-10-CM | POA: Insufficient documentation

## 2015-12-10 NOTE — Assessment & Plan Note (Signed)
Looks like this is resolving. Pt is well appearing today. Will check cbc w. Diff and cmet today as well. Probably viral in etiology. Imodium OTC recommended for loose/watery stools. Zofran called into pharmacy for nausea. Soft bland diet until appetite returns to normal. FU prn worsening/failure to improve.

## 2015-12-15 ENCOUNTER — Ambulatory Visit (INDEPENDENT_AMBULATORY_CARE_PROVIDER_SITE_OTHER): Payer: PPO | Admitting: Nurse Practitioner

## 2015-12-15 VITALS — BP 118/80 | HR 68 | Ht 63.0 in | Wt 193.0 lb

## 2015-12-15 DIAGNOSIS — M25561 Pain in right knee: Secondary | ICD-10-CM

## 2015-12-15 MED ORDER — MELOXICAM 15 MG PO TABS: 15.0000 mg | ORAL_TABLET | Freq: Every day | ORAL | Status: DC

## 2015-12-15 NOTE — Patient Instructions (Signed)
Meniscus Tear °A meniscus tear is a knee injury in which a piece of the meniscus is torn. The meniscus is a thick, rubbery, wedge-shaped cartilage in the knee. Two menisci are located in each knee. They sit between the upper bone (femur) and lower bone (tibia) that make up the knee joint. Each meniscus acts as a shock absorber for the knee. °A torn meniscus is one of the most common types of knee injuries. This injury can range from mild to severe. Surgery may be needed for a severe tear. °CAUSES °This injury may be caused by any squatting, twisting, or pivoting movement. Sports-related injuries are the most common cause. These often occur from: °· Running and stopping suddenly. °· Changing direction. °· Being tackled or knocked off your feet. °As people get older, their meniscus gets thinner and weaker. In these people, tears can happen more easily, such as from climbing stairs.  °RISK FACTORS °This injury is more likely to happen to: °· People who play contact sports. °· Males. °· People who are 30-40 years of age. °SYMPTOMS  °Symptoms of this injury include: °· Knee pain, especially at the side of the knee joint. You may feel pain when the injury occurs, or you may only hear a pop and feel pain later. °· A feeling that your knee is clicking, catching, locking, or giving way. °· Not being able to fully bend or extend your knee. °· Bruising or swelling in your knee. °DIAGNOSIS  °This injury may be diagnosed based on your symptoms and a physical exam. The physical exam may include: °· Moving your knee in different ways. °· Feeling for tenderness. °· Listening for a clicking sound. °· Checking if your knee locks or catches. °You may also have tests, such as: °· X-rays. °· MRI. °· A procedure to look inside your knee with a narrow surgical telescope (arthroscopy). °You may be referred to a knee specialist (orthopedic surgeon). °TREATMENT  °Treatment for this injury depends on the severity of the tear. Treatment for a  mild tear may include: °· Rest. °· Medicine to reduce pain and swelling. This is usually a nonsteroidal anti-inflammatory drug (NSAID). °· A knee brace or an elastic sleeve or wrap. °· Using crutches or a walker to keep weight off your knee and to help you walk. °· Exercises to strengthen your knee (physical therapy). °You may need surgery if you have a severe tear or if other treatments are not working.  °HOME CARE INSTRUCTIONS °Managing Pain and Swelling °· Take over-the-counter and prescription medicines only as told by your health care provider. °· If directed, apply ice to the injured area: °¨ Put ice in a plastic bag. °¨ Place a towel between your skin and the bag. °¨ Leave the ice on for 20 minutes, 2-3 times per day. °· Raise (elevate) the injured area above the level of your heart while you are sitting or lying down. °Activity °· Do not use the injured limb to support your body weight until your health care provider says that you can. Use crutches or a walker as told by your health care provider. °· Return to your normal activities as told by your health care provider. Ask your health care provider what activities are safe for you. °· Perform range-of-motion exercises only as told by your health care provider. °· Begin doing exercises to strengthen your knee and leg muscles only as told by your health care provider. After you recover, your health care provider may recommend these exercises to   help prevent another injury. °General Instructions °· Use a knee brace or elastic wrap as told by your health care provider. °· Keep all follow-up visits as told by your health care provider. This is important. °SEEK MEDICAL CARE IF: °· You have a fever. °· Your knee becomes red, tender, or swollen. °· Your pain medicine is not helping. °· Your symptoms get worse or do not improve after 2 weeks of home care. °  °This information is not intended to replace advice given to you by your health care provider. Make sure you  discuss any questions you have with your health care provider. °  °Document Released: 03/05/2003 Document Revised: 09/03/2015 Document Reviewed: 04/07/2015 °Elsevier Interactive Patient Education ©2016 [employer] ° °

## 2015-12-15 NOTE — Progress Notes (Signed)
Patient ID: Katelyn Friedman, female    DOB: 28-Apr-1951  Age: 64 y.o. MRN: [id number]  CC: Knee Pain   HPI Katelyn Friedman presents for Knee right pain x 10 days.    1) Right knee pain:  Onset- 12/05/15 Location- Right knee > Left knee, inside she reports Duration- fluctuating, but constant  Characteristics- tooth ache  Aggravating factors- walking, sitting, standing  Relieving factors- None Severity- Moderate   Went on a cruise recently, stairs bothered her going up, but not going down.   Treatment to date:  Ibuprofen- helpful  Jacuzzi nightly- helpful  Leg exercises- nightly- helpful   History Katelyn Friedman has a past medical history of Arthritis; Allergic rhinitis; Herpes simplex; GERD (gastroesophageal reflux disease); History of colon polyps; IBS (irritable bowel syndrome); and Thyroid disease.   She has past surgical history that includes Rotator cuff repair (2006); Knee arthroscopy (1992); Tubal ligation; Tonsillectomy; Colonoscopy (2006); and Breast biopsy (Right, 2014).   Her family history includes Cancer in her maternal aunt; Colon polyps in her maternal aunt; Dementia in her father; Stroke in her mother.She reports that she has never smoked. She has never used smokeless tobacco. She reports that she does not drink alcohol or use illicit drugs.  Outpatient Prescriptions Prior to Visit  Medication Sig Dispense Refill  . acyclovir (ZOVIRAX) 400 MG tablet TAKE ONE TABLET BY MOUTH ONCE DAILY 90 tablet 1  . albuterol (PROVENTIL HFA;VENTOLIN HFA) 108 (90 BASE) MCG/ACT inhaler Inhale 2 puffs into the lungs every 6 (six) hours as needed for wheezing or shortness of breath. 1 Inhaler 2  . ALPRAZolam (XANAX) 0.5 MG tablet Take 1 tablet (0.5 mg total) by mouth at bedtime as needed for anxiety or sleep. 30 tablet 2  . aspirin 81 MG tablet Take 81 mg by mouth every other day.    . Cholecalciferol (VITAMIN D-3) 1000 UNITS CAPS Take 1 capsule by mouth daily.    . ergocalciferol (DRISDOL)  50000 UNITS capsule Take 1 capsule (50,000 Units total) by mouth once a week. 4 capsule 0  . Esomeprazole Magnesium (NEXIUM PO) Take by mouth.    . ondansetron (ZOFRAN) 4 MG tablet Take 1 tablet (4 mg total) by mouth every 8 (eight) hours as needed for nausea or vomiting. 20 tablet 0   No facility-administered medications prior to visit.    ROS Review of Systems  Constitutional: Negative for fever, chills, diaphoresis and fatigue.  Respiratory: Negative for chest tightness, shortness of breath and wheezing.   Cardiovascular: Negative for chest pain, palpitations and leg swelling.  Gastrointestinal: Negative for nausea, vomiting and diarrhea.  Musculoskeletal: Positive for joint swelling and arthralgias.  Skin: Negative for rash.  Neurological: Negative for dizziness, weakness, numbness and headaches.  Psychiatric/Behavioral: The patient is not nervous/anxious.     Objective:  BP 118/80 mmHg  Pulse 68  Ht 5\' 3"  (1.6 m)  Wt 193 lb (87.544 kg)  BMI 34.20 kg/m2  Physical Exam  Constitutional: She is oriented to person, place, and time. She appears well-developed and well-nourished. No distress.  HENT:  Head: Normocephalic and atraumatic.  Right Ear: External ear normal.  Left Ear: External ear normal.  Cardiovascular: Normal rate, regular rhythm and normal heart sounds.  Exam reveals no gallop and no friction rub.   No murmur heard. Pulmonary/Chest: Effort normal and breath sounds normal. No respiratory distress. She has no wheezes. She has no rales. She exhibits no tenderness.  Musculoskeletal: Normal range of motion. She exhibits tenderness. She exhibits no edema.  Legs: Areas of tenderness. Baker's cyst probable cause of swelling on posterior right knee; medial side inferior patella, no external tenderness to palpation, normal ROM, but slow due to tenderness   Neurological: She is alert and oriented to person, place, and time. No cranial nerve deficit. She exhibits normal  muscle tone. Coordination normal.  Skin: Skin is warm and dry. No rash noted. She is not diaphoretic.  Psychiatric: She has a normal mood and affect. Her behavior is normal. Judgment and thought content normal.   Assessment & Plan:   Katelyn Friedman was seen today for knee pain.  Diagnoses and all orders for this visit:  Right knee pain  Other orders -     meloxicam (MOBIC) 15 MG tablet; Take 1 tablet (15 mg total) by mouth daily.  I am having Katelyn Friedman start on meloxicam. I am also having her maintain her aspirin, albuterol, Vitamin D-3, Esomeprazole Magnesium (NEXIUM PO), ALPRAZolam, ergocalciferol, acyclovir, and ondansetron.  Meds ordered this encounter  Medications  . meloxicam (MOBIC) 15 MG tablet    Sig: Take 1 tablet (15 mg total) by mouth daily.    Dispense:  30 tablet    Refill:  0    Order Specific Question:  Supervising Provider    Answer:  [name] [[id number]     Follow-up: Return in about 2 weeks (around 12/29/2015) for Follow up of right knee pain.

## 2015-12-22 ENCOUNTER — Encounter: Payer: Self-pay | Admitting: Nurse Practitioner

## 2015-12-22 DIAGNOSIS — M25561 Pain in right knee: Secondary | ICD-10-CM | POA: Insufficient documentation

## 2015-12-22 NOTE — Assessment & Plan Note (Signed)
New-onset 10 days We'll try Mobic once daily Rest, ice, compression, elevation Follow-up in 2 weeks

## 2016-01-05 ENCOUNTER — Ambulatory Visit: Payer: PPO | Admitting: Internal Medicine

## 2016-01-05 ENCOUNTER — Encounter: Payer: Self-pay | Admitting: Internal Medicine

## 2016-01-05 ENCOUNTER — Ambulatory Visit (INDEPENDENT_AMBULATORY_CARE_PROVIDER_SITE_OTHER): Payer: PPO | Admitting: Internal Medicine

## 2016-01-05 VITALS — BP 126/72 | HR 66 | Temp 97.9°F | Resp 12 | Ht 63.0 in | Wt 193.0 lb

## 2016-01-05 DIAGNOSIS — M25561 Pain in right knee: Secondary | ICD-10-CM | POA: Diagnosis not present

## 2016-01-05 DIAGNOSIS — R198 Other specified symptoms and signs involving the digestive system and abdomen: Secondary | ICD-10-CM | POA: Insufficient documentation

## 2016-01-05 DIAGNOSIS — F458 Other somatoform disorders: Secondary | ICD-10-CM

## 2016-01-05 DIAGNOSIS — M25569 Pain in unspecified knee: Secondary | ICD-10-CM | POA: Insufficient documentation

## 2016-01-05 DIAGNOSIS — M25562 Pain in left knee: Secondary | ICD-10-CM | POA: Insufficient documentation

## 2016-01-05 DIAGNOSIS — R0989 Other specified symptoms and signs involving the circulatory and respiratory systems: Secondary | ICD-10-CM

## 2016-01-05 MED ORDER — CELECOXIB 200 MG PO CAPS
200.0000 mg | ORAL_CAPSULE | Freq: Two times a day (BID) | ORAL | Status: DC
Start: 2016-01-05 — End: 2016-06-08

## 2016-01-05 NOTE — Progress Notes (Signed)
Pre-visit discussion using our clinic review tool. No additional management support is needed unless otherwise documented below in the visit note.  

## 2016-01-05 NOTE — Assessment & Plan Note (Signed)
Improved with use of meloicam , but not resolved . She has crepitus in both knees, and small effusion in the inferomedial side, but she has deferred joint aspiration and injection.  She would like to try a different NSAID, (celebrex) for a few weeks, along with icing knee several times daily. If no improvement in 2 weeks,  She can return for x rays and joint injectio nor request ortho referral.  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

## 2016-01-05 NOTE — Patient Instructions (Addendum)
Your knee pain may respond better to celebrex up to two times daily and frequent icing (15 minutes,  Up to 3 times daily).  Stop the meloxicam.  Ok to add tylenol up to 2000 mg daily   If the knee pain has not improved in 2 weeks,  Your options are to return for a steroid injection by me, or you can request referral to Dr Tamala Julian   Your throat sensation may be due to  Mucus collecting in the back of your sinuses where it meets the throat.  You should try [facility]'s Sinus rinse ;  It is a stong sinus "flush" using water and medicated salts.  Do it over the sink because it can be a bit messy

## 2016-01-05 NOTE — Progress Notes (Signed)
Subjective:  Patient ID: Katelyn Friedman, female    DOB: Feb 07, 1951  Age: 65 y.o. MRN: [id number]  CC: The primary encounter diagnosis was Medial knee pain, right. Diagnoses of Right knee pain and Globus sensation were also pertinent to this visit.  HPI Katelyn Friedman presents for 1) follow up on persistent knee pain  R> L for the past month. She started having pain a few days before she went on an 8 day cruise, but was made worse by the extensive walking she did on the cruise, to the point that she had to stop particpating in the walking tours. She could not ascend the stairs on the ship due to feeling a "catching" pain in the knee.   Describes pain in the inferior portion of knee and swelling on the inferomedial side.   Was seen by NP and treated 2 weeks ago with meloxicam . The knee pain has improved but not resolved, and is aggravated by lying on it or twisting. She has developed some queasiness  (almost nausea), despite taking the nexium daily and taking the medication with food.  She has a history of celebrex use a long time ago when her left knee was bothering her .      2) feeling like she has a soft lump in throat .feels like she is gagging at night when she is supine.  Has been present for one week   Feels like a glob of mucus. Denies ear pain, sinus pain ,excessive drainage, and cough. No dysphagia or pain with swallowing. .     Outpatient Prescriptions Prior to Visit  Medication Sig Dispense Refill  . acyclovir (ZOVIRAX) 400 MG tablet TAKE ONE TABLET BY MOUTH ONCE DAILY 90 tablet 1  . albuterol (PROVENTIL HFA;VENTOLIN HFA) 108 (90 BASE) MCG/ACT inhaler Inhale 2 puffs into the lungs every 6 (six) hours as needed for wheezing or shortness of breath. 1 Inhaler 2  . ALPRAZolam (XANAX) 0.5 MG tablet Take 1 tablet (0.5 mg total) by mouth at bedtime as needed for anxiety or sleep. 30 tablet 2  . Cholecalciferol (VITAMIN D-3) 1000 UNITS CAPS Take 1 capsule by mouth daily.    .  Esomeprazole Magnesium (NEXIUM PO) Take by mouth.    . meloxicam (MOBIC) 15 MG tablet Take 1 tablet (15 mg total) by mouth daily. 30 tablet 0  . aspirin 81 MG tablet Take 81 mg by mouth every other day. Reported on 01/05/2016    . ergocalciferol (DRISDOL) 50000 UNITS capsule Take 1 capsule (50,000 Units total) by mouth once a week. (Patient not taking: Reported on 01/05/2016) 4 capsule 0  . ondansetron (ZOFRAN) 4 MG tablet Take 1 tablet (4 mg total) by mouth every 8 (eight) hours as needed for nausea or vomiting. (Patient not taking: Reported on 01/05/2016) 20 tablet 0   No facility-administered medications prior to visit.    Review of Systems;  Patient denies headache, fevers, malaise, unintentional weight loss, skin rash, eye pain, sinus congestion and sinus pain, sore throat, dysphagia,  hemoptysis , cough, dyspnea, wheezing, chest pain, palpitations, orthopnea, edema, abdominal pain, nausea, melena, diarrhea, constipation, flank pain, dysuria, hematuria, urinary  Frequency, nocturia, numbness, tingling, seizures,  Focal weakness, Loss of consciousness,  Tremor, insomnia, depression, anxiety, and suicidal ideation.      Objective:  BP 126/72 mmHg  Pulse 66  Temp(Src) 97.9 F (36.6 C) (Oral)  Resp 12  Ht 5\' 3"  (1.6 m)  Wt 193 lb (87.544 kg)  BMI 34.20 kg/m2  SpO2 99%  BP Readings from Last 3 Encounters:  01/05/16 126/72  12/15/15 118/80  12/02/15 128/70    Wt Readings from Last 3 Encounters:  01/05/16 193 lb (87.544 kg)  12/15/15 193 lb (87.544 kg)  12/02/15 196 lb (88.905 kg)    General appearance: alert, cooperative and appears stated age Ears: normal TM's and external ear canals both ears Throat: lips, mucosa, and tongue normal; teeth and gums normal Neck: no adenopathy, no carotid bruit, supple, symmetrical, trachea midline and thyroid not enlarged, symmetric, no tenderness/mass/nodules Back: symmetric, no curvature. ROM normal. No CVA tenderness. Lungs: clear to  auscultation bilaterally Heart: regular rate and rhythm, S1, S2 normal, no murmur, click, rub or gallop Abdomen: soft, non-tender; bowel sounds normal; no masses,  no organomegaly Pulses: 2+ and symmetric MSK: bilateral crepitus. Right medial knee with swelling, tender along infermedial border of patella.  No warmth or erythema.  Skin: color, texture, turgor normal. No rashes or lesions Lymph nodes: Cervical, supraclavicular, and axillary nodes normal.  No results found for: HGBA1C  Lab Results  Component Value Date   CREATININE 0.85 12/02/2015   CREATININE 0.96 03/03/2015   CREATININE 1.0 10/02/2013    Lab Results  Component Value Date   WBC 7.9 12/02/2015   HGB 12.9 12/02/2015   HCT 40.1 12/02/2015   PLT 282.0 12/02/2015   GLUCOSE 97 12/02/2015   CHOL 214* 03/03/2015   TRIG 91.0 03/03/2015   HDL 61.00 03/03/2015   LDLCALC 135* 03/03/2015   ALT 16 12/02/2015   AST 20 12/02/2015   NA 142 12/02/2015   K 3.9 12/02/2015   CL 106 12/02/2015   CREATININE 0.85 12/02/2015   BUN 10 12/02/2015   CO2 29 12/02/2015   TSH 2.07 03/03/2015    Mm Digital Screening Bilateral  08/27/2015  CLINICAL DATA:  Screening. EXAM: DIGITAL SCREENING BILATERAL MAMMOGRAM WITH CAD COMPARISON:  Previous exam(s). ACR Breast Density Category b: There are scattered areas of fibroglandular density. FINDINGS: There are no findings suspicious for malignancy. Images were processed with CAD. IMPRESSION: No mammographic evidence of malignancy. A result letter of this screening mammogram will be mailed directly to the patient. RECOMMENDATION: Screening mammogram in one year. (Code:SM-B-01Y) BI-RADS CATEGORY  1: Negative. Electronically Signed   By: Lillia Mountain M.D.   On: 08/27/2015 13:24    Assessment & Plan:   Problem List Items Addressed This Visit    Right knee pain    Improved with use of meloicam , but not resolved . She has crepitus in both knees, and small effusion in the inferomedial side, but she has  deferred joint aspiration and injection.  She would like to try a different NSAID, (celebrex) for a few weeks, along with icing knee several times daily. If no improvement in 2 weeks,  She can return for x rays and joint injectio nor request ortho referral.  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  Already treating GERD. Exam is  normal. Trial of sinus rinse ([facility]'s)        A total of 25 minutes of face to face time was spent with patient more than half of which was spent in counselling about the above mentioned conditions  and coordination of care   I have discontinued Ms. Henjum ergocalciferol, ondansetron, and meloxicam. I am also having her start on celecoxib. Additionally, I am  having her maintain her aspirin, albuterol, Vitamin D-3, Esomeprazole Magnesium (NEXIUM PO), ALPRAZolam, and acyclovir.  Meds ordered this encounter  Medications  . celecoxib (CELEBREX) 200 MG capsule    Sig: Take 1 capsule (200 mg total) by mouth 2 (two) times daily.    Dispense:  60 capsule    Refill:  0    Medications Discontinued During This Encounter  Medication Reason  . ergocalciferol (DRISDOL) 50000 UNITS capsule Completed Course  . ondansetron (ZOFRAN) 4 MG tablet Completed Course  . meloxicam (MOBIC) 15 MG tablet     Follow-up: No Follow-up on file.   [name], MD

## 2016-01-05 NOTE — Assessment & Plan Note (Deleted)
She has crepitus in both knees, a small effusion in the inferomedial side. But has deferred

## 2016-01-05 NOTE — Assessment & Plan Note (Addendum)
likely due to retained mucus,  Given normal exam.  Already treating GERD. Exam is  normal. Trial of sinus rinse ([facility]'s)

## 2016-01-28 ENCOUNTER — Ambulatory Visit (INDEPENDENT_AMBULATORY_CARE_PROVIDER_SITE_OTHER): Payer: PPO | Admitting: Family Medicine

## 2016-01-28 ENCOUNTER — Encounter: Payer: Self-pay | Admitting: Family Medicine

## 2016-01-28 ENCOUNTER — Ambulatory Visit: Payer: PPO | Admitting: Family Medicine

## 2016-01-28 VITALS — BP 124/78 | HR 81 | Temp 98.6°F | Ht 63.0 in | Wt 190.6 lb

## 2016-01-28 DIAGNOSIS — J320 Chronic maxillary sinusitis: Secondary | ICD-10-CM | POA: Insufficient documentation

## 2016-01-28 DIAGNOSIS — J0100 Acute maxillary sinusitis, unspecified: Secondary | ICD-10-CM

## 2016-01-28 MED ORDER — DOXYCYCLINE HYCLATE 100 MG PO TABS: 100.0000 mg | ORAL_TABLET | Freq: Two times a day (BID) | ORAL | Status: DC

## 2016-01-28 NOTE — Patient Instructions (Addendum)
Nice to see you. You have a sinus infection. We will treat this with an antibiotic called doxycycline. You need to take a probiotic or eat yogurt while on this. If you develop shortness of breath, chest pain, cough productive of blood, or any new or change in symptoms please seek medical attention immediately. If you're not improved by early next week please let us know.

## 2016-01-28 NOTE — Progress Notes (Signed)
Patient ID: Katelyn Friedman, female   DOB: 1951-12-23, 65 y.o.   MRN: [id number]  Katelyn Rumps, MD Phone: [phone]  Katelyn Friedman is a 65 y.o. female who presents today for same-day visit.  Sinusitis: Patient notes 2 weeks ago started with symptoms of rhinorrhea, nasal congestion, sneezing, and watery eyes. Has progressively developed increasing congestion in her sinuses and pressure in her sinuses. Notes postnasal drip. Notes nonproductive cough. She notes most bothersome aspect is the pressure in her maxillary sinuses. She notes temperatures ranging from 25F to 101F. She feels tired. No chest pain or shortness of breath. Has used Nasonex last 2 days with minimal benefit.  PMH: nonsmoker.   ROS history of present illness  Objective  Physical Exam Filed Vitals:   01/28/16 1520  BP: 124/78  Pulse: 81  Temp: 98.6 F (37 C)    BP Readings from Last 3 Encounters:  01/28/16 124/78  01/05/16 126/72  12/15/15 118/80   Wt Readings from Last 3 Encounters:  01/28/16 190 lb 9.6 oz (86.456 kg)  01/05/16 193 lb (87.544 kg)  12/15/15 193 lb (87.544 kg)    Physical Exam  Constitutional: She is well-developed, well-nourished, and in no distress.  HENT:  Head: Normocephalic and atraumatic.  Right Ear: External ear normal.  Left Ear: External ear normal.  Mouth/Throat: Oropharynx is clear and moist. No oropharyngeal exudate.  Normal TMs bilaterally, tenderness to percussion of bilateral maxillary sinuses  Eyes: Conjunctivae are normal. Pupils are equal, round, and reactive to light.  Neck: Neck supple.  Cardiovascular: Normal rate, regular rhythm and normal heart sounds.  Exam reveals no gallop and no friction rub.   No murmur heard. Pulmonary/Chest: Effort normal and breath sounds normal. No respiratory distress. She has no wheezes. She has no rales.  Lymphadenopathy:    She has no cervical adenopathy.  Neurological: She is alert. Gait normal.  Skin: Skin is warm and  dry. She is not diaphoretic.     Assessment/Plan: Please see individual problem list.  Maxillary sinusitis Patient's symptoms consistent with maxillary sinusitis given duration, symptoms, and intermittent fevers. We'll treat with doxycycline given patient preference to not take Augmentin due to prior diarrhea with this. Advised to use a probiotic or eat yogurt while on antibiotic. Given return precautions.    Meds ordered this encounter  Medications  . doxycycline (VIBRA-TABS) 100 MG tablet    Sig: Take 1 tablet (100 mg total) by mouth 2 (two) times daily.    Dispense:  14 tablet    Refill:  0     Katelyn Friedman

## 2016-01-28 NOTE — Progress Notes (Signed)
Pre visit review using our clinic review tool, if applicable. No additional management support is needed unless otherwise documented below in the visit note. 

## 2016-01-28 NOTE — Assessment & Plan Note (Signed)
Patient's symptoms consistent with maxillary sinusitis given duration, symptoms, and intermittent fevers. We'll treat with doxycycline given patient preference to not take Augmentin due to prior diarrhea with this. Advised to use a probiotic or eat yogurt while on antibiotic. Given return precautions.

## 2016-03-03 ENCOUNTER — Ambulatory Visit (INDEPENDENT_AMBULATORY_CARE_PROVIDER_SITE_OTHER): Payer: PPO | Admitting: Family Medicine

## 2016-03-03 ENCOUNTER — Ambulatory Visit
Admission: RE | Admit: 2016-03-03 | Discharge: 2016-03-03 | Disposition: A | Discharge status: Home / Self Care | Payer: PPO | Source: Ambulatory Visit | Attending: Family Medicine | Admitting: Family Medicine

## 2016-03-03 ENCOUNTER — Ambulatory Visit (INDEPENDENT_AMBULATORY_CARE_PROVIDER_SITE_OTHER)
Admission: RE | Admit: 2016-03-03 | Discharge: 2016-03-03 | Disposition: A | Discharge status: Home / Self Care | Payer: PPO | Source: Ambulatory Visit | Attending: Family Medicine | Admitting: Family Medicine

## 2016-03-03 VITALS — BP 130/80 | HR 69 | Temp 97.7°F | Ht 63.0 in | Wt 187.8 lb

## 2016-03-03 DIAGNOSIS — M25562 Pain in left knee: Secondary | ICD-10-CM | POA: Diagnosis not present

## 2016-03-03 DIAGNOSIS — M25561 Pain in right knee: Secondary | ICD-10-CM | POA: Diagnosis not present

## 2016-03-03 DIAGNOSIS — M179 Osteoarthritis of knee, unspecified: Secondary | ICD-10-CM | POA: Diagnosis not present

## 2016-03-03 DIAGNOSIS — M2391 Unspecified internal derangement of right knee: Secondary | ICD-10-CM

## 2016-03-03 MED ORDER — METHYLPREDNISOLONE ACETATE 40 MG/ML IJ SUSP
80.0000 mg | Freq: Once | INTRAMUSCULAR | Status: AC
  Administered 2016-03-03: 80 mg via INTRA_ARTICULAR

## 2016-03-03 NOTE — Progress Notes (Signed)
Dr. Frederico Hamman T. Gerron Guidotti, MD, [facility] [place], [address] Phone: [phone] Fax: [phone]  03/03/2016  Patient: Katelyn Friedman, MRN: [id number], DOB: [birth year], 65 y.o.  Primary Physician:  [name], MD   Chief Complaint  Patient presents with  . Knee Pain    Bilateral-RIght Knee is worse   Subjective:   Katelyn Friedman is a 65 y.o. very pleasant female patient who presents with the following:  Consult: Dr. Derrel Nip Re: B Knees  In December, was doing some squats and felt something in her right knee. Had to hobble around for a few days and wore a knee brace. Got better to walk on it, and some throbbing aching pain. Quick move will make it hurt.   She has been having some intermittent effusions on the right side.  She has not had any mechanical locking up of her joint or symptomatic giving way.  I initially, she was placed on meloxicam, but she was unable tolerate this and now she is on Celebrex without difficulty.  She does still have pain now 3 months after her initial injury, and she is experiencing some pain and limitations in her movement and her workout schedule that are affecting her quality of life.  She does have a history of a prior left-sided knee arthroscopy done in 1992, but no prior operative intervention in the right knee.  Past Medical History, Surgical History, Social History, Family History, Problem List, Medications, and Allergies have been reviewed and updated if relevant.  Patient Active Problem List   Diagnosis Date Noted  . Maxillary sinusitis 01/28/2016  . Globus sensation 01/05/2016  . Right knee pain 12/22/2015  . Diarrhea 12/10/2015  . Encounter for preventive health examination 03/05/2015  . Encounter for screening colonoscopy 03/26/2014  . Graves disease 09/25/2013  . Vitamin D deficiency 08/04/2013  . Transient cerebral ischemia 07/20/2013  . Obesity (BMI 30-39.9)  05/01/2012  . 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 Arthritis   . Allergic rhinitis   . Herpes simplex   . GERD (gastroesophageal reflux disease)   . History of colon polyps     Past Medical History  Diagnosis Date  . Arthritis   . Allergic rhinitis   . Herpes simplex   . GERD (gastroesophageal reflux disease)   . History of colon polyps   . IBS (irritable bowel syndrome)   . Thyroid disease     graves disease    Past Surgical History  Procedure Laterality Date  . Rotator cuff repair  2006    Margaretmary Eddy   . Knee arthroscopy  1992  . Tubal ligation    . Tonsillectomy    . Colonoscopy  2006  . Breast biopsy Right 2014    negative    Social History   Social History  . Marital Status: Married    Spouse Name: N/A  . Number of Children: N/A  . Years of Education: N/A   Occupational History  . Not on file.   Social History Main Topics  . Smoking status: Never Smoker   . Smokeless tobacco: Never Used  . Alcohol Use: No  . Drug Use: No  . Sexual Activity:    Partners: Male   Other Topics Concern  . Not on file   Social History Narrative   Married: 19 yrs   Regular exercise: walk   Caffeine use: none    Family History  Problem Relation  Age of Onset  . Stroke Mother   . Dementia Father   . Colon polyps Maternal Aunt   . Cancer Maternal Aunt     colon    Allergies  Allergen Reactions  . Codeine Nausea Only  . Influenza Vaccines Swelling    Redness, Fever    Medication list reviewed and updated in full in [facility].  GEN: No fevers, chills. Nontoxic. Primarily MSK c/o today. MSK: Detailed in the HPI GI: tolerating PO intake without difficulty Neuro: No numbness, parasthesias, or tingling associated. Otherwise the pertinent positives of the ROS are noted above.   Objective:   BP 130/80 mmHg  Pulse 69  Temp(Src) 97.7 F (36.5 C) (Oral)  Ht 5\' 3"  (1.6 m)  Wt 187 lb 12 oz (85.163 kg)  BMI 33.27 kg/m2   GEN: WDWN, NAD,  Non-toxic, Alert & Oriented x 3 HEENT: Atraumatic, Normocephalic.  Ears and Nose: No external deformity. EXTR: No clubbing/cyanosis/edema NEURO: Normal gait.  PSYCH: Normally interactive. Conversant. Not depressed or anxious appearing.  Calm demeanor.   Knee:  R Gait: Normal heel toe pattern, mild pain ROM: 0-110 Effusion: mild Echymosis or edema: none Patellar tendon NT Painful PLICA: neg Patellar grind: negative Medial and lateral patellar facet loading: negative medial and lateral joint lines: MEDIAL JOINT LINE TENDERNESS NOTABLE. MINOR LATERAL TENDERNESS Mcmurray's POS FOR PAIN Flexion-pinch POS Varus and valgus stress: stable Lachman: neg Ant and Post drawer: neg Hip abduction, IR, ER: WNL Hip flexion str: 5/5 Hip abd: 5/5 Quad: 5/5 VMO atrophy:No Hamstring concentric and eccentric: 5/5   Radiology: Dg Knee Ap/lat W/sunrise Left  03/03/2016  CLINICAL DATA:  Bilateral knee pain right greater than left over the last 3 months, no trauma EXAM: LEFT KNEE 3 VIEWS COMPARISON:  None. FINDINGS: There is mild tricompartmental degenerative joint disease of the left knee. There is very little loss of joint space but there is bony spurring from each compartment. Also there is chondrocalcinosis noted which may indicate CPPD arthropathy. A small amount of joint fluid cannot be excluded. The patella is normally positioned. IMPRESSION: Mild tricompartmental degenerative joint disease. 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 Electronically Signed   By: Ivar Drape M.D.   On: 03/03/2016 15:56   Dg Knee Ap/lat W/sunrise Right  03/03/2016  CLINICAL DATA:  Right knee pain for 3 months.  No known injury. EXAM: RIGHT KNEE 3 VIEWS COMPARISON:  None. FINDINGS: There is no evidence of fracture, dislocation, or joint effusion. There is no evidence of arthropathy or other focal bone abnormality. Soft tissues are unremarkable. IMPRESSION: Negative. Electronically Signed   By: Earle Gell M.D.   On:  03/03/2016 15:54     Assessment and Plan:   Right knee pain - Plan: DG Knee AP/LAT W/Sunrise Right, methylPREDNISolone acetate (DEPO-MEDROL) injection 80 mg  Left knee pain - Plan: DG Knee AP/LAT W/Sunrise Left  Derangement, knee internal, right  Right knee pain, most concerning for medial meniscal tear by clinical history and examination.  Failure to improve with 3 months of conservative management.  After discussion with patient, she would like to continue with some additional conservative measures for now.  We'll try to do an intra-articular corticosteroid injection to see if this will calm down her effusion and symptoms.  She is unable to have an MRI, so if symptoms persist, then direct arthroscopy would be an appropriate next step for evaluation and definitive management.  Left knee with chondrocalcinosis.  No known history of pseudogout.  This may be  cartilaginous change given her prior knee arthroscopy which can sometimes be seen in osteoarthritis alone.  I appreciate the opportunity to evaluate this very friendly patient. If you have any question regarding her care or prognosis, do not hesitate to ask.   Knee Injection, R Patient verbally consented to procedure. Risks (including potential rare risk of infection), benefits, and alternatives explained. Sterilely prepped with Chloraprep. Ethyl cholride used for anesthesia. 8 cc Lidocaine 1% mixed with 2 mL Depo-Medrol 40 mg injected using the anteromedial approach without difficulty. No complications with procedure and tolerated well. Patient had decreased pain post-injection.   Follow-up: if remains symptomatic in 4-6 weeks she will call me.  Orders Placed This Encounter  Procedures  . DG Knee AP/LAT W/Sunrise Right  . DG Knee AP/LAT W/Sunrise Left    Signed,  Mallorie Norrod T. Tamanika Heiney, MD   Patient's Medications  New Prescriptions   No medications on file  Previous Medications   ACYCLOVIR (ZOVIRAX) 400 MG TABLET    TAKE ONE  TABLET BY MOUTH ONCE DAILY   ALBUTEROL (PROVENTIL HFA;VENTOLIN HFA) 108 (90 BASE) MCG/ACT INHALER    Inhale 2 puffs into the lungs every 6 (six) hours as needed for wheezing or shortness of breath.   ALPRAZOLAM (XANAX) 0.5 MG TABLET    Take 1 tablet (0.5 mg total) by mouth at bedtime as needed for anxiety or sleep.   ASPIRIN 81 MG TABLET    Take 81 mg by mouth every other day. Reported on 01/05/2016   CELECOXIB (CELEBREX) 200 MG CAPSULE    Take 1 capsule (200 mg total) by mouth 2 (two) times daily.   CHOLECALCIFEROL (VITAMIN D-3) 1000 UNITS CAPS    Take 1 capsule by mouth daily.   ESOMEPRAZOLE MAGNESIUM (NEXIUM PO)    Take 1 capsule by mouth daily as needed.   Modified Medications   No medications on file  Discontinued Medications   DOXYCYCLINE (VIBRA-TABS) 100 MG TABLET    Take 1 tablet (100 mg total) by mouth 2 (two) times daily.

## 2016-03-03 NOTE — Progress Notes (Signed)
Pre visit review using our clinic review tool, if applicable. No additional management support is needed unless otherwise documented below in the visit note. 

## 2016-03-04 ENCOUNTER — Encounter: Payer: Self-pay | Admitting: Family Medicine

## 2016-05-12 ENCOUNTER — Encounter: Payer: Self-pay | Admitting: Family Medicine

## 2016-05-12 ENCOUNTER — Ambulatory Visit (INDEPENDENT_AMBULATORY_CARE_PROVIDER_SITE_OTHER): Payer: PPO | Admitting: Family Medicine

## 2016-05-12 VITALS — BP 126/78 | HR 66 | Temp 98.2°F | Ht 62.5 in | Wt 177.2 lb

## 2016-05-12 DIAGNOSIS — N898 Other specified noninflammatory disorders of vagina: Secondary | ICD-10-CM

## 2016-05-12 DIAGNOSIS — N949 Unspecified condition associated with female genital organs and menstrual cycle: Secondary | ICD-10-CM | POA: Diagnosis not present

## 2016-05-12 MED ORDER — FLUCONAZOLE 150 MG PO TABS
150.0000 mg | ORAL_TABLET | Freq: Once | ORAL | Status: DC
Start: 2016-05-12 — End: 2016-06-08

## 2016-05-12 NOTE — Patient Instructions (Signed)
We will call with the results of your swab.  This appears to be yeast vaginitis.  Take the diflucan as prescribed.  Follow up closely with PCP.  Take care  Dr. Lacinda Axon

## 2016-05-12 NOTE — Assessment & Plan Note (Signed)
New problem. History and exam suggestive of yeast vaginitis. Treating empirically with Diflucan while awaiting wet prep results.

## 2016-05-12 NOTE — Progress Notes (Signed)
   Subjective:  Patient ID: Katelyn Friedman, female    DOB: 1951/05/30  Age: 66 y.o. MRN: [id number]  CC: Vaginal burning  HPI:  65 year old female presents with the above complaint.  Patient reports a two-week history of vaginal burning, redness, irritation. She denies any associated itching. She reports clear discharge. It is slightly uncomfortable/painful. No urinary complaints. She has been using some Desitin with minimal improvement. No recent antibiotic use. No known exacerbating factors. No other complaints today.  Social Hx   Social History   Social History  . Marital Status: Married    Spouse Name: N/A  . Number of Children: N/A  . Years of Education: N/A   Social History Main Topics  . Smoking status: Never Smoker   . Smokeless tobacco: Never Used  . Alcohol Use: No  . Drug Use: No  . Sexual Activity:    Partners: Male   Other Topics Concern  . None   Social History Narrative   Married: 19 yrs   Regular exercise: walk   Caffeine use: none   Review of Systems  Constitutional: Negative.   Genitourinary: Positive for vaginal discharge.       Vaginal redness/irritation.   Objective:  BP 126/78 mmHg  Pulse 66  Temp(Src) 98.2 F (36.8 C) (Oral)  Ht 5' 2.5" (1.588 m)  Wt 177 lb 4 oz (80.4 kg)  BMI 31.88 kg/m2  SpO2 95%  BP/Weight 05/12/2016 [id number]  Systolic BP [id number]  Diastolic BP 78 80 78  Wt. (Lbs) 177.25 187.75 190.6  BMI 31.88 33.27 33.77   Physical Exam  Constitutional: She is oriented to person, place, and time. She appears well-developed. No distress.  Pulmonary/Chest: Effort normal.  Genitourinary:  GU: Vaginal redness/irritation noted. Minimal discharge noted. Wet prep obtained today.   Neurological: She is alert and oriented to person, place, and time.  Psychiatric: She has a normal mood and affect.  Vitals reviewed.  Lab Results  Component Value Date   WBC 7.9 12/02/2015   HGB 12.9 12/02/2015   HCT 40.1 12/02/2015   PLT 282.0 12/02/2015   GLUCOSE 97 12/02/2015   CHOL 214* 03/03/2015   TRIG 91.0 03/03/2015   HDL 61.00 03/03/2015   LDLCALC 135* 03/03/2015   ALT 16 12/02/2015   AST 20 12/02/2015   NA 142 12/02/2015   K 3.9 12/02/2015   CL 106 12/02/2015   CREATININE 0.85 12/02/2015   BUN 10 12/02/2015   CO2 29 12/02/2015   TSH 2.07 03/03/2015    Assessment & Plan:   Problem List Items Addressed This Visit    Vaginal irritation - Primary    New problem. History and exam suggestive of yeast vaginitis. Treating empirically with Diflucan while awaiting wet prep results.      Relevant Orders   WET PREP BY MOLECULAR PROBE      Meds ordered this encounter  Medications  . fluconazole (DIFLUCAN) 150 MG tablet    Sig: Take 1 tablet (150 mg total) by mouth once. Repeat dosing in 72 hours.    Dispense:  2 tablet    Refill:  1    Follow-up: PRN  [place]

## 2016-05-13 ENCOUNTER — Other Ambulatory Visit: Payer: Self-pay | Admitting: Family Medicine

## 2016-05-13 LAB — WET PREP BY MOLECULAR PROBE
Candida species: POSITIVE — AB
Gardnerella vaginalis: POSITIVE — AB
Trichomonas vaginosis: NEGATIVE

## 2016-05-13 MED ORDER — METRONIDAZOLE 500 MG PO TABS: 500.0000 mg | ORAL_TABLET | Freq: Two times a day (BID) | ORAL | Status: DC

## 2016-06-03 ENCOUNTER — Ambulatory Visit: Payer: PPO | Admitting: Family Medicine

## 2016-06-04 ENCOUNTER — Other Ambulatory Visit: Payer: Self-pay | Admitting: Orthopaedic Surgery

## 2016-06-04 DIAGNOSIS — M25561 Pain in right knee: Secondary | ICD-10-CM | POA: Diagnosis not present

## 2016-06-07 ENCOUNTER — Other Ambulatory Visit (HOSPITAL_COMMUNITY): Payer: Self-pay | Admitting: *Deleted

## 2016-06-07 ENCOUNTER — Encounter (HOSPITAL_COMMUNITY): Payer: Self-pay | Admitting: *Deleted

## 2016-06-07 MED ORDER — CEFAZOLIN SODIUM-DEXTROSE 2-4 GM/100ML-% IV SOLN
2.0000 g | INTRAVENOUS | Status: AC
  Administered 2016-06-08: 2 g via INTRAVENOUS
  Filled 2016-06-07: qty 100

## 2016-06-07 MED ORDER — LACTATED RINGERS IV SOLN
INTRAVENOUS | Status: DC
  Administered 2016-06-08: 14:00:00 via INTRAVENOUS
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
## 2016-06-07 NOTE — Progress Notes (Signed)
Patient instructions given, patient stated that she stopped aspirin on 06/04/16

## 2016-06-08 ENCOUNTER — Ambulatory Visit (HOSPITAL_COMMUNITY): Payer: PPO | Admitting: Anesthesiology

## 2016-06-08 ENCOUNTER — Encounter (HOSPITAL_COMMUNITY): Payer: Self-pay | Admitting: *Deleted

## 2016-06-08 ENCOUNTER — Encounter (HOSPITAL_COMMUNITY)
Admission: RE | Disposition: A | Discharge status: Home / Self Care | Payer: Self-pay | Source: Ambulatory Visit | Attending: Orthopaedic Surgery

## 2016-06-08 ENCOUNTER — Ambulatory Visit (HOSPITAL_COMMUNITY)
Admission: RE | Admit: 2016-06-08 | Discharge: 2016-06-08 | Disposition: A | Discharge status: Home / Self Care | Payer: PPO | Source: Ambulatory Visit | Attending: Orthopaedic Surgery | Admitting: Orthopaedic Surgery

## 2016-06-08 DIAGNOSIS — J45909 Unspecified asthma, uncomplicated: Secondary | ICD-10-CM | POA: Diagnosis not present

## 2016-06-08 DIAGNOSIS — K589 Irritable bowel syndrome without diarrhea: Secondary | ICD-10-CM | POA: Diagnosis not present

## 2016-06-08 DIAGNOSIS — K219 Gastro-esophageal reflux disease without esophagitis: Secondary | ICD-10-CM | POA: Diagnosis not present

## 2016-06-08 DIAGNOSIS — S83241A Other tear of medial meniscus, current injury, right knee, initial encounter: Secondary | ICD-10-CM | POA: Insufficient documentation

## 2016-06-08 DIAGNOSIS — M94261 Chondromalacia, right knee: Secondary | ICD-10-CM | POA: Insufficient documentation

## 2016-06-08 DIAGNOSIS — M1990 Unspecified osteoarthritis, unspecified site: Secondary | ICD-10-CM | POA: Diagnosis not present

## 2016-06-08 DIAGNOSIS — Z79899 Other long term (current) drug therapy: Secondary | ICD-10-CM | POA: Diagnosis not present

## 2016-06-08 DIAGNOSIS — Z7982 Long term (current) use of aspirin: Secondary | ICD-10-CM | POA: Diagnosis not present

## 2016-06-08 DIAGNOSIS — B009 Herpesviral infection, unspecified: Secondary | ICD-10-CM | POA: Insufficient documentation

## 2016-06-08 DIAGNOSIS — X58XXXA Exposure to other specified factors, initial encounter: Secondary | ICD-10-CM | POA: Diagnosis not present

## 2016-06-08 DIAGNOSIS — M23321 Other meniscus derangements, posterior horn of medial meniscus, right knee: Secondary | ICD-10-CM | POA: Diagnosis not present

## 2016-06-08 HISTORY — DX: Nausea with vomiting, unspecified: R11.2

## 2016-06-08 HISTORY — PX: KNEE ARTHROSCOPY: SHX127

## 2016-06-08 HISTORY — DX: Other specified postprocedural states: Z98.890

## 2016-06-08 HISTORY — DX: Family history of other specified conditions: Z84.89

## 2016-06-08 LAB — CBC
HCT: 39.8 % (ref 36.0–46.0)
Hemoglobin: 13.2 g/dL (ref 12.0–15.0)
MCH: 27.4 pg (ref 26.0–34.0)
MCHC: 33.2 g/dL (ref 30.0–36.0)
MCV: 82.6 fL (ref 78.0–100.0)
Platelets: 253 10*3/uL (ref 150–400)
RBC: 4.82 MIL/uL (ref 3.87–5.11)
RDW: 15.8 % — ABNORMAL HIGH (ref 11.5–15.5)
WBC: 7.9 10*3/uL (ref 4.0–10.5)

## 2016-06-08 LAB — BASIC METABOLIC PANEL
Anion gap: 6 (ref 5–15)
BUN: 14 mg/dL (ref 6–20)
CO2: 25 mmol/L (ref 22–32)
Calcium: 9.6 mg/dL (ref 8.9–10.3)
Chloride: 109 mmol/L (ref 101–111)
Creatinine, Ser: 0.94 mg/dL (ref 0.44–1.00)
GFR calc Af Amer: >60 mL/min (ref >60)
GFR calc non Af Amer: >60 mL/min (ref >60)
Glucose, Bld: 91 mg/dL (ref 65–99)
Potassium: 3.8 mmol/L (ref 3.5–5.1)
Sodium: 140 mmol/L (ref 135–145)

## 2016-06-08 SURGERY — ARTHROSCOPY, KNEE
Anesthesia: General | Laterality: Right

## 2016-06-08 MED ORDER — PROPOFOL 10 MG/ML IV BOLUS
INTRAVENOUS | Status: DC | PRN
  Administered 2016-06-08: 200 mg via INTRAVENOUS
  Administered 2016-06-08: 20 mg via INTRAVENOUS

## 2016-06-08 MED ORDER — STERILE WATER FOR IRRIGATION IR SOLN
Status: DC | PRN
Start: 2016-06-08 — End: 2016-06-08
  Administered 2016-06-08: 1000 mL

## 2016-06-08 MED ORDER — BUPIVACAINE-EPINEPHRINE (PF) 0.25% -1:200000 IJ SOLN
INTRAMUSCULAR | Status: AC
  Filled 2016-06-08: qty 30

## 2016-06-08 MED ORDER — FENTANYL CITRATE (PF) 250 MCG/5ML IJ SOLN
INTRAMUSCULAR | Status: AC
  Filled 2016-06-08: qty 5

## 2016-06-08 MED ORDER — MORPHINE SULFATE (PF) 4 MG/ML IV SOLN
INTRAVENOUS | Status: AC
  Filled 2016-06-08: qty 1

## 2016-06-08 MED ORDER — ONDANSETRON HCL 4 MG/2ML IJ SOLN
INTRAMUSCULAR | Status: DC | PRN
  Administered 2016-06-08: 4 mg via INTRAVENOUS

## 2016-06-08 MED ORDER — PROPOFOL 10 MG/ML IV BOLUS
INTRAVENOUS | Status: AC
  Filled 2016-06-08: qty 20

## 2016-06-08 MED ORDER — BUPIVACAINE-EPINEPHRINE 0.25% -1:200000 IJ SOLN
INTRAMUSCULAR | Status: DC | PRN
  Administered 2016-06-08: 10 mL

## 2016-06-08 MED ORDER — PROMETHAZINE HCL 25 MG/ML IJ SOLN
6.2500 mg | Freq: Once | INTRAMUSCULAR | Status: AC
  Administered 2016-06-08: 6.25 mg via INTRAVENOUS

## 2016-06-08 MED ORDER — PROMETHAZINE HCL 12.5 MG PO TABS: 12.5000 mg | ORAL_TABLET | Freq: Four times a day (QID) | ORAL | Status: DC | PRN

## 2016-06-08 MED ORDER — HYDROCODONE-ACETAMINOPHEN 5-325 MG PO TABS: 0.5000 | ORAL_TABLET | Freq: Four times a day (QID) | ORAL | Status: DC | PRN

## 2016-06-08 MED ORDER — MORPHINE SULFATE (PF) 4 MG/ML IV SOLN
INTRAVENOUS | Status: DC | PRN
  Administered 2016-06-08: 4 mg

## 2016-06-08 MED ORDER — HYDROMORPHONE HCL 1 MG/ML IJ SOLN: 0.2500 mg | INTRAMUSCULAR | Status: DC | PRN

## 2016-06-08 MED ORDER — FENTANYL CITRATE (PF) 250 MCG/5ML IJ SOLN
INTRAMUSCULAR | Status: DC | PRN
  Administered 2016-06-08 (×6): 25 ug via INTRAVENOUS

## 2016-06-08 MED ORDER — PROMETHAZINE HCL 25 MG/ML IJ SOLN
INTRAMUSCULAR | Status: AC
  Filled 2016-06-08: qty 1

## 2016-06-08 MED ORDER — MIDAZOLAM HCL 5 MG/5ML IJ SOLN
INTRAMUSCULAR | Status: DC | PRN
  Administered 2016-06-08: 2 mg via INTRAVENOUS

## 2016-06-08 MED ORDER — ONDANSETRON HCL 4 MG/2ML IJ SOLN
INTRAMUSCULAR | Status: AC
  Filled 2016-06-08: qty 2

## 2016-06-08 MED ORDER — MIDAZOLAM HCL 2 MG/2ML IJ SOLN
INTRAMUSCULAR | Status: AC
  Filled 2016-06-08: qty 2

## 2016-06-08 MED ORDER — LIDOCAINE HCL (CARDIAC) 20 MG/ML IV SOLN
INTRAVENOUS | Status: DC | PRN
  Administered 2016-06-08: 100 mg via INTRAVENOUS

## 2016-06-08 MED ORDER — DEXAMETHASONE SODIUM PHOSPHATE 4 MG/ML IJ SOLN
INTRAMUSCULAR | Status: DC | PRN
  Administered 2016-06-08: 10 mg via INTRAVENOUS

## 2016-06-08 MED ORDER — CHLORHEXIDINE GLUCONATE 4 % EX LIQD: 60.0000 mL | Freq: Once | CUTANEOUS | Status: DC

## 2016-06-08 MED ORDER — SODIUM CHLORIDE 0.9 % IR SOLN
Status: DC | PRN
  Administered 2016-06-08: 1000 mL

## 2016-06-08 MED ORDER — DEXAMETHASONE SODIUM PHOSPHATE 10 MG/ML IJ SOLN
INTRAMUSCULAR | Status: AC
  Filled 2016-06-08: qty 1

## 2016-06-08 MED ORDER — ROCURONIUM BROMIDE 50 MG/5ML IV SOLN
INTRAVENOUS | Status: AC
  Filled 2016-06-08: qty 1

## 2016-06-08 SURGICAL SUPPLY — 37 items
BANDAGE ELASTIC 6 VELCRO ST LF (GAUZE/BANDAGES/DRESSINGS) ×2 IMPLANT
BLADE GREAT WHITE 4.2 (BLADE) ×2 IMPLANT
BLADE SURG 11 STRL SS (BLADE) ×1 IMPLANT
BNDG GAUZE ELAST 4 BULKY (GAUZE/BANDAGES/DRESSINGS) ×2 IMPLANT
COVER SURGICAL LIGHT HANDLE (MISCELLANEOUS) ×2 IMPLANT
CUFF TOURNIQUET SINGLE 34IN LL (TOURNIQUET CUFF) IMPLANT
CUFF TOURNIQUET SINGLE 44IN (TOURNIQUET CUFF) IMPLANT
DRAPE ARTHROSCOPY W/POUCH 114 (DRAPES) ×2 IMPLANT
DRAPE U-SHAPE 47X51 STRL (DRAPES) ×2 IMPLANT
DRSG ADAPTIC 3X8 NADH LF (GAUZE/BANDAGES/DRESSINGS) ×1 IMPLANT
DRSG EMULSION OIL 3X3 NADH (GAUZE/BANDAGES/DRESSINGS) ×2 IMPLANT
DRSG PAD ABDOMINAL 8X10 ST (GAUZE/BANDAGES/DRESSINGS) ×2 IMPLANT
DURAPREP 26ML APPLICATOR (WOUND CARE) ×3 IMPLANT
GAUZE SPONGE 4X4 12PLY STRL (GAUZE/BANDAGES/DRESSINGS) ×2 IMPLANT
GLOVE BIO SURGEON STRL SZ8 (GLOVE) ×6 IMPLANT
GLOVE BIOGEL PI IND STRL 8 (GLOVE) ×1 IMPLANT
GLOVE BIOGEL PI INDICATOR 8 (GLOVE) ×2
GOWN STRL REUS W/ TWL LRG LVL3 (GOWN DISPOSABLE) ×3 IMPLANT
GOWN STRL REUS W/ TWL XL LVL3 (GOWN DISPOSABLE) ×3 IMPLANT
GOWN STRL REUS W/TWL 2XL LVL3 (GOWN DISPOSABLE) ×2 IMPLANT
GOWN STRL REUS W/TWL LRG LVL3 (GOWN DISPOSABLE) ×6
GOWN STRL REUS W/TWL XL LVL3 (GOWN DISPOSABLE) ×6
KIT ROOM TURNOVER OR (KITS) ×2 IMPLANT
MANIFOLD NEPTUNE II (INSTRUMENTS) ×2 IMPLANT
NDL 18GX1X1/2 (RX/OR ONLY) (NEEDLE) ×1 IMPLANT
NDL SPNL 18GX3.5 QUINCKE PK (NEEDLE) IMPLANT
NEEDLE 18GX1X1/2 (RX/OR ONLY) (NEEDLE) IMPLANT
NEEDLE 22X1 1/2 (OR ONLY) (NEEDLE) ×1 IMPLANT
NEEDLE SPNL 18GX3.5 QUINCKE PK (NEEDLE) ×4 IMPLANT
PACK ARTHROSCOPY DSU (CUSTOM PROCEDURE TRAY) ×2 IMPLANT
PAD ARMBOARD 7.5X6 YLW CONV (MISCELLANEOUS) ×4 IMPLANT
SET ARTHROSCOPY TUBING (MISCELLANEOUS) ×2
SET ARTHROSCOPY TUBING LN (MISCELLANEOUS) ×1 IMPLANT
SPONGE GAUZE 4X4 12PLY STER LF (GAUZE/BANDAGES/DRESSINGS) ×1 IMPLANT
SYR CONTROL 10ML LL (SYRINGE) ×2 IMPLANT
TOWEL OR 17X24 6PK STRL BLUE (TOWEL DISPOSABLE) ×4 IMPLANT
WATER STERILE IRR 1000ML POUR (IV SOLUTION) ×2 IMPLANT

## 2016-06-08 NOTE — Interval H&P Note (Signed)
History and Physical Interval Note:  06/08/2016 2:18 PM  Katelyn Friedman  has presented today for surgery, with the diagnosis of Right knee meniscus tear  The various methods of treatment have been discussed with the patient and family. After consideration of risks, benefits and other options for treatment, the patient has consented to  Procedure(s): ARTHROSCOPY KNEE (Right) as a surgical intervention .  The patient's history has been reviewed, patient examined, no change in status, stable for surgery.  I have reviewed the patient's chart and labs.  Questions were answered to the patient's satisfaction.     [name]

## 2016-06-08 NOTE — Op Note (Signed)
NAMEMarland Kitchen  Katelyn, Friedman NO.:  [id number]  MEDICAL RECORD NO.:  [id number]  LOCATION:  [facility]                         FACILITY:  [facility]  PHYSICIAN:  Monico Blitz. Latrina Guttman, M.D.DATE OF BIRTH:  1951/01/29  DATE OF PROCEDURE:  06/08/2016 DATE OF DISCHARGE:  06/08/2016                              OPERATIVE REPORT   PREOPERATIVE DIAGNOSES: 1. Right knee torn medial meniscus. 2. Right knee chondromalacia.  POSTOPERATIVE DIAGNOSES: 1. Right knee torn medial meniscus. 2. Right knee chondromalacia.  PROCEDURES: 1. Right knee partial medial meniscectomy. 2. Right knee abrasion chondroplasty patellofemoral.  ANESTHESIA:  General.  ATTENDING SURGEON:  Monico Blitz. Rhona Raider, M.D.  ASSISTANT:  Loni Dolly, PA.  INDICATION FOR PROCEDURE:  The patient is a 65 year old woman with about 6 months of some intense right knee pain.  This is persisted despite oral anti-inflammatories, bracing, exercise program, and an injection. By ultrasound study, she has a medial meniscus tear.  She has pain which limits her ability to rest and walk, and she is offered an arthroscopy. Informed operative consent was obtained after discussion of possible complications including reaction to anesthesia and infection.  SUMMARY OF FINDINGS AND PROCEDURE:  Under general anesthesia, a right knee arthroscopy was performed.  Suprapatellar pouch was benign while the patellofemoral joint exhibited some focal breakdown through the inter-trochlear groove addressed with chondroplasty and abrasion of bleeding bone in 1 small area.  She had a non pathologic plica, which I excised.  In the medial compartment, she had a displaceable tear of the middle and posterior horn.  This required about 15% of partial medial meniscectomy done with basket and shaver.  She had some related grade 3 chondromalacia relatively focal and this was addressed with chondroplasty.  The ACL was normal.  The lateral compartment exhibited no  evidence of meniscal or articular cartilage injury.  She was scheduled to go home same day.  DESCRIPTION OF PROCEDURE:  The patient was taken to the operating suite, where general anesthetic was applied without difficulty.  She was positioned supine and prepped and draped in normal sterile fashion. After the administration of IV Kefzol and an appropriate time out, an arthroscopy of the right knee was performed through total of 2 portals. Findings were as noted above and procedure consisted predominantly the partial medial meniscectomy which was done with basket and shaver.  We then performed chondroplasty of the area on the medial femoral condyle which appeared to be affected by the displacement portion of her tear. I also performed the abrasion as described above in the patellofemoral joint.  The knee was thoroughly irrigated followed by placement of Marcaine with epinephrine and morphine.  Adaptic was placed over the portals followed by dry gauze and a loose Ace wrap.  Estimated blood loss and fluids, obtain from anesthesia records.  DISPOSITION:  The patient was extubated in the operating room and taken to the recovery room in stable condition.  Plans were for her to go home same-day and follow up in office closely.  I will contact her by phone tonight.     Monico Blitz Rhona Raider, M.D.     PGD/MEDQ  D:  06/08/2016  T:  06/08/2016  Job:  [id number]

## 2016-06-08 NOTE — Op Note (Signed)
Katelyn Friedman [id number] 06/08/2016   PRE-OP DIAGNOSIS: right knee TMM  POST-OP DIAGNOSIS: same  PROCEDURE: right knee scope  ANESTHESIA: general  [name]   Dictation #:  [id number]

## 2016-06-08 NOTE — Anesthesia Preprocedure Evaluation (Addendum)
Anesthesia Evaluation  Patient identified by MRN, date of birth, ID band Patient awake    Reviewed: Allergy & Precautions, H&P , NPO status , Patient's Chart, lab work & pertinent test results  History of Anesthesia Complications (+) PONV  Airway Mallampati: II  TM Distance: >3 FB Neck ROM: Full    Dental no notable dental hx. (+) Teeth Intact, Dental Advisory Given   Pulmonary asthma ,    Pulmonary exam normal breath sounds clear to auscultation       Cardiovascular negative cardio ROS   Rhythm:Regular Rate:Normal     Neuro/Psych Anxiety negative neurological ROS  negative psych ROS   GI/Hepatic Neg liver ROS, GERD  Medicated and Controlled,  Endo/Other  Hyperthyroidism   Renal/GU negative Renal ROS  negative genitourinary   Musculoskeletal  (+) Arthritis , Osteoarthritis,    Abdominal   Peds  Hematology negative hematology ROS (+)   Anesthesia Other Findings   Reproductive/Obstetrics negative OB ROS                            Anesthesia Physical Anesthesia Plan  ASA: II  Anesthesia Plan: General   Post-op Pain Management:    Induction: Intravenous  Airway Management Planned: LMA  Additional Equipment:   Intra-op Plan:   Post-operative Plan: Extubation in OR  Informed Consent: I have reviewed the patients History and Physical, chart, labs and discussed the procedure including the risks, benefits and alternatives for the proposed anesthesia with the patient or authorized representative who has indicated his/her understanding and acceptance.   Dental advisory given  Plan Discussed with: CRNA  Anesthesia Plan Comments:         Anesthesia Quick Evaluation

## 2016-06-08 NOTE — Transfer of Care (Signed)
Immediate Anesthesia Transfer of Care Note  Patient: Katelyn Friedman  Procedure(s) Performed: Procedure(s): ARTHROSCOPY KNEE- partial medial minisectomy and condroplasty (Right)  Patient Location: PACU  Anesthesia Type:General  Level of Consciousness: awake, alert , oriented and patient cooperative  Airway & Oxygen Therapy: Patient Spontanous Breathing and Patient connected to nasal cannula oxygen  Post-op Assessment: Report given to RN, Post -op Vital signs reviewed and stable and Patient moving all extremities  Post vital signs: Reviewed and stable  Last Vitals:  Filed Vitals:   06/08/16 1408  BP: 146/64  Pulse: 59  Temp: 36.8 C  Resp: 16    Last Pain:  Filed Vitals:   06/08/16 1615  PainSc: 5       Patients Stated Pain Goal: 5 ([id number])  Complications: No apparent anesthesia complications

## 2016-06-08 NOTE — Anesthesia Postprocedure Evaluation (Signed)
Anesthesia Post Note  Patient: Katelyn Friedman  Procedure(s) Performed: Procedure(s) (LRB): ARTHROSCOPY KNEE- partial medial minisectomy and condroplasty (Right)  Patient location during evaluation: PACU Anesthesia Type: General Level of consciousness: awake and alert Pain management: pain level controlled Vital Signs Assessment: post-procedure vital signs reviewed and stable Respiratory status: spontaneous breathing, nonlabored ventilation and respiratory function stable Cardiovascular status: blood pressure returned to baseline and stable Postop Assessment: no signs of nausea or vomiting Anesthetic complications: no    Last Vitals:  Filed Vitals:   06/08/16 1645 06/08/16 1700  BP: 156/77 146/74  Pulse: 77 60  Temp:    Resp: 16 14    Last Pain:  Filed Vitals:   06/08/16 1701  PainSc: Asleep                 Markian Glockner,W. EDMOND

## 2016-06-08 NOTE — Anesthesia Procedure Notes (Signed)
Procedure Name: LMA Insertion Date/Time: 06/08/2016 3:21 PM Performed by: Myna Bright Pre-anesthesia Checklist: Patient identified, Emergency Drugs available, Suction available and Patient being monitored Patient Re-evaluated:Patient Re-evaluated prior to inductionOxygen Delivery Method: Circle system utilized Preoxygenation: Pre-oxygenation with 100% oxygen Intubation Type: IV induction LMA: LMA inserted LMA Size: 4.0 Tube type: Oral Number of attempts: 1 Placement Confirmation: positive ETCO2 and breath sounds checked- equal and bilateral Tube secured with: Tape Dental Injury: Teeth and Oropharynx as per pre-operative assessment

## 2016-06-09 ENCOUNTER — Encounter (HOSPITAL_COMMUNITY): Payer: Self-pay | Admitting: Orthopaedic Surgery

## 2016-06-14 DIAGNOSIS — M25561 Pain in right knee: Secondary | ICD-10-CM | POA: Diagnosis not present

## 2016-06-17 DIAGNOSIS — M25561 Pain in right knee: Secondary | ICD-10-CM | POA: Diagnosis not present

## 2016-06-17 DIAGNOSIS — M222X1 Patellofemoral disorders, right knee: Secondary | ICD-10-CM | POA: Diagnosis not present

## 2016-06-24 ENCOUNTER — Other Ambulatory Visit: Payer: Self-pay | Admitting: Family Medicine

## 2016-06-24 ENCOUNTER — Telehealth: Payer: Self-pay | Admitting: Internal Medicine

## 2016-06-24 DIAGNOSIS — M25561 Pain in right knee: Secondary | ICD-10-CM | POA: Diagnosis not present

## 2016-06-24 DIAGNOSIS — M222X1 Patellofemoral disorders, right knee: Secondary | ICD-10-CM | POA: Diagnosis not present

## 2016-06-24 MED ORDER — TERCONAZOLE 0.8 % VA CREA
1.0000 | TOPICAL_CREAM | Freq: Every day | VAGINAL | Status: DC
Start: 2016-06-24 — End: 2016-08-25

## 2016-06-24 NOTE — Telephone Encounter (Signed)
Notified patient, thanks 

## 2016-06-24 NOTE — Telephone Encounter (Signed)
Gave diflucan pills, please advise they are not working, thanks

## 2016-06-24 NOTE — Telephone Encounter (Signed)
Rx sent 

## 2016-06-24 NOTE — Telephone Encounter (Signed)
Pt called about wanting to get a cream called in for the itch that she's having she states the pills are not working. Pt was seen on 05/17 with Dr Lacinda Axon. Thank you!  Pharmacy is [facility] [address], [place]  Call pt @ [phone] or cell [phone]. Thank you!

## 2016-07-01 DIAGNOSIS — M25561 Pain in right knee: Secondary | ICD-10-CM | POA: Diagnosis not present

## 2016-07-01 DIAGNOSIS — M222X1 Patellofemoral disorders, right knee: Secondary | ICD-10-CM | POA: Diagnosis not present

## 2016-07-07 DIAGNOSIS — M25561 Pain in right knee: Secondary | ICD-10-CM | POA: Diagnosis not present

## 2016-07-07 DIAGNOSIS — M222X1 Patellofemoral disorders, right knee: Secondary | ICD-10-CM | POA: Diagnosis not present

## 2016-07-16 ENCOUNTER — Other Ambulatory Visit: Payer: Self-pay | Admitting: Internal Medicine

## 2016-07-16 DIAGNOSIS — Z1231 Encounter for screening mammogram for malignant neoplasm of breast: Secondary | ICD-10-CM

## 2016-08-04 DIAGNOSIS — M23321 Other meniscus derangements, posterior horn of medial meniscus, right knee: Secondary | ICD-10-CM | POA: Diagnosis not present

## 2016-08-19 DIAGNOSIS — H2513 Age-related nuclear cataract, bilateral: Secondary | ICD-10-CM | POA: Diagnosis not present

## 2016-08-25 ENCOUNTER — Other Ambulatory Visit (HOSPITAL_COMMUNITY)
Admission: RE | Admit: 2016-08-25 | Discharge: 2016-08-25 | Disposition: A | Discharge status: Home / Self Care | Payer: PPO | Source: Ambulatory Visit | Attending: Internal Medicine | Admitting: Internal Medicine

## 2016-08-25 ENCOUNTER — Ambulatory Visit (INDEPENDENT_AMBULATORY_CARE_PROVIDER_SITE_OTHER): Payer: PPO | Admitting: Internal Medicine

## 2016-08-25 ENCOUNTER — Encounter: Payer: Self-pay | Admitting: Internal Medicine

## 2016-08-25 VITALS — BP 118/72 | HR 67 | Temp 98.1°F | Resp 12 | Ht 62.5 in | Wt 165.5 lb

## 2016-08-25 DIAGNOSIS — E0500 Thyrotoxicosis with diffuse goiter without thyrotoxic crisis or storm: Secondary | ICD-10-CM

## 2016-08-25 DIAGNOSIS — Z1151 Encounter for screening for human papillomavirus (HPV): Secondary | ICD-10-CM | POA: Diagnosis not present

## 2016-08-25 DIAGNOSIS — E559 Vitamin D deficiency, unspecified: Secondary | ICD-10-CM | POA: Diagnosis not present

## 2016-08-25 DIAGNOSIS — Z1231 Encounter for screening mammogram for malignant neoplasm of breast: Secondary | ICD-10-CM | POA: Diagnosis not present

## 2016-08-25 DIAGNOSIS — Z124 Encounter for screening for malignant neoplasm of cervix: Secondary | ICD-10-CM | POA: Diagnosis not present

## 2016-08-25 DIAGNOSIS — F5105 Insomnia due to other mental disorder: Secondary | ICD-10-CM

## 2016-08-25 DIAGNOSIS — E785 Hyperlipidemia, unspecified: Secondary | ICD-10-CM | POA: Diagnosis not present

## 2016-08-25 DIAGNOSIS — E669 Obesity, unspecified: Secondary | ICD-10-CM | POA: Diagnosis not present

## 2016-08-25 DIAGNOSIS — Z79899 Other long term (current) drug therapy: Secondary | ICD-10-CM | POA: Diagnosis not present

## 2016-08-25 DIAGNOSIS — Z0000 Encounter for general adult medical examination without abnormal findings: Secondary | ICD-10-CM

## 2016-08-25 DIAGNOSIS — F419 Anxiety disorder, unspecified: Secondary | ICD-10-CM

## 2016-08-25 DIAGNOSIS — Z01419 Encounter for gynecological examination (general) (routine) without abnormal findings: Secondary | ICD-10-CM | POA: Insufficient documentation

## 2016-08-25 MED ORDER — DICLOFENAC SODIUM 75 MG PO TBEC
75.0000 mg | DELAYED_RELEASE_TABLET | Freq: Two times a day (BID) | ORAL | 2 refills | Status: DC
Start: 2016-08-25 — End: 2017-07-18

## 2016-08-25 MED ORDER — ACYCLOVIR 400 MG PO TABS: 400.0000 mg | ORAL_TABLET | Freq: Every day | ORAL | 3 refills | Status: DC

## 2016-08-25 MED ORDER — ALPRAZOLAM 0.5 MG PO TABS: 0.5000 mg | ORAL_TABLET | Freq: Every evening | ORAL | 3 refills | Status: DC | PRN

## 2016-08-25 NOTE — Progress Notes (Signed)
Patient ID: Katelyn Friedman, female    DOB: 18-Nov-1951  Age: 65 y.o. MRN: [id number]  The patient is here for her "Welcome to  Medicare"  wellness examination and management of other chronic and acute problems.   Last PAP normal 2014 [place] scheduled for  sept 1 2017 Colonoscopy 2015 polyp  Follow up 10 years  2025 DEXA  2014 osteopenia -1.5 spine hips normal    Knee arthroscopy [facility] June 2017 Allergies to mobic itching tolerating celebrex but too $$$  disucssed trial of diclofenac   Intentional wt loss of 30 lbs since last physical  Body mass index is 29.79 kg/m.    The risk factors are reflected in the social history.  The roster of all physicians providing medical care to patient - is listed in the Snapshot section of the chart.  Activities of daily living:  The patient is 100% independent in all ADLs: dressing, toileting, feeding as well as independent mobility  Home safety : The patient has smoke detectors in the home. They wear seatbelts.  There are no firearms at home. There is no violence in the home.   There is no risks for hepatitis, STDs or HIV. There is no   history of blood transfusion. They have no travel history to infectious disease endemic areas of the world.  The patient has seen their dentist in the last six month. They have seen their eye doctor in the last year. They admit to slight hearing difficulty with regard to whispered voices and some television programs.  They have deferred audiologic testing in the last year.  They do not  have excessive sun exposure. Discussed the need for sun protection: hats, long sleeves and use of sunscreen if there is significant sun exposure.   Diet: the importance of a healthy diet is discussed. They do have a healthy diet.  The benefits of regular aerobic exercise were discussed. She walks 4 times per week ,  20 minutes.   Depression screen: there are no signs or vegative symptoms of  depression- irritability, change in appetite, anhedonia, sadness/tearfullness.  Cognitive assessment: the patient manages all their financial and personal affairs and is actively engaged. They could relate day,date,year and events; recalled 2/3 objects at 3 minutes; performed clock-face test normally.  The following portions of the patient's history were reviewed and updated as appropriate: allergies, current medications, past family history, past medical history,  past surgical history, past social history  and problem list.  Visual acuity was not assessed per patient preference since she has regular follow up with her ophthalmologist. Hearing and body mass index were assessed and reviewed.   During the course of the visit the patient was educated and counseled about appropriate screening and preventive services including : fall prevention , diabetes screening, nutrition counseling, colorectal cancer screening, and recommended immunizations.    During the course of the visit , End of Life objectives were discussed at length,  Patient does not have a living will in place or a healthcare power of attorney.  She was given printed information about advance directives and encouraged to return after discussing with her family,    CC: The primary encounter diagnosis was Welcome to Medicare preventive visit. Diagnoses of Encounter for screening mammogram for breast cancer, Cervical cancer screening, Graves disease, Insomnia secondary to anxiety, Vitamin D deficiency, Hyperlipidemia, Long-term use of high-risk medication, and Obesity (BMI 30-39.9) were also pertinent to this visit.  History Katelyn Friedman has a past medical  history of Allergic rhinitis; Arthritis; Asthma; Family history of adverse reaction to anesthesia; GERD (gastroesophageal reflux disease); Herpes simplex; History of colon polyps; IBS (irritable bowel syndrome); PONV (postoperative nausea and vomiting); and Thyroid disease.   She has a past  surgical history that includes Rotator cuff repair (2006); Knee arthroscopy (1992); Tubal ligation; Tonsillectomy; Colonoscopy (2006); Breast biopsy (Right, 2014); and Knee arthroscopy (Right, 06/08/2016).   Her family history includes Cancer in her maternal aunt; Colon polyps in her maternal aunt; Dementia in her father; Stroke in her mother.She reports that she has never smoked. She has never used smokeless tobacco. She reports that she does not drink alcohol or use drugs.  Outpatient Medications Prior to Visit  Medication Sig Dispense Refill  . albuterol (PROVENTIL HFA;VENTOLIN HFA) 108 (90 BASE) MCG/ACT inhaler Inhale 2 puffs into the lungs every 6 (six) hours as needed for wheezing or shortness of breath. 1 Inhaler 2  . aspirin EC 81 MG tablet Take 81 mg by mouth every other day.     . esomeprazole (NEXIUM) 20 MG capsule Take 20 mg by mouth daily as needed (For heartburn or acid reflux.).    [name] acyclovir (ZOVIRAX) 400 MG tablet TAKE ONE TABLET BY MOUTH ONCE DAILY 90 tablet 1  . HYDROcodone-acetaminophen (NORCO) 5-325 MG tablet Take 0.5-1 tablets by mouth every 6 (six) hours as needed for moderate pain. 30 tablet 0  . promethazine (PHENERGAN) 12.5 MG tablet Take 1-2 tablets (12.5-25 mg total) by mouth every 6 (six) hours as needed for nausea or vomiting. 30 tablet 0  . terconazole (TERAZOL 3) 0.8 % vaginal cream Place 1 applicator vaginally at bedtime. For 3 nights. 20 g 0   No facility-administered medications prior to visit.     Review of Systems   Patient denies headache, fevers, malaise, unintentional weight loss, skin rash, eye pain, sinus congestion and sinus pain, sore throat, dysphagia,  hemoptysis , cough, dyspnea, wheezing, chest pain, palpitations, orthopnea, edema, abdominal pain, nausea, melena, diarrhea, constipation, flank pain, dysuria, hematuria, urinary  Frequency, nocturia, numbness, tingling, seizures,  Focal weakness, Loss of consciousness,  Tremor, insomnia, depression,  anxiety, and suicidal ideation.     Objective:  BP 118/72   Pulse 67   Temp 98.1 F (36.7 C) (Oral)   Resp 12   Ht 5' 2.5" (1.588 m)   Wt 165 lb 8 oz (75.1 kg)   SpO2 98%   BMI 29.79 kg/m   Physical Exam   General Appearance:    Alert, cooperative, no distress, appears stated age  Head:    Normocephalic, without obvious abnormality, atraumatic  Eyes:    PERRL, conjunctiva/corneas clear, EOM's intact, fundi    benign, both eyes  Ears:    Normal TM's and external ear canals, both ears  Nose:   Nares normal, septum midline, mucosa normal, no drainage    or sinus tenderness  Throat:   Lips, mucosa, and tongue normal; teeth and gums normal  Neck:   Supple, symmetrical, trachea midline, no adenopathy;    thyroid:  no enlargement/tenderness/nodules; no carotid   bruit or JVD  Back:     Symmetric, no curvature, ROM normal, no CVA tenderness  Lungs:     Clear to auscultation bilaterally, respirations unlabored  Chest Wall:    No tenderness or deformity   Heart:    Regular rate and rhythm, S1 and S2 normal, no murmur, rub   or gallop  Breast Exam:    No tenderness, masses, or nipple abnormality  Abdomen:     Soft, non-tender, bowel sounds active all four quadrants,    no masses, no organomegaly  Genitalia:    Pelvic: cervix normal in appearance, external genitalia normal, no adnexal masses or tenderness, no cervical motion tenderness, rectovaginal septum normal, uterus normal size, shape, and consistency and vagina normal without discharge  Extremities:   Extremities normal, atraumatic, no cyanosis or edema  Pulses:   2+ and symmetric all extremities  Skin:   Skin color, texture, turgor normal, no rashes or lesions  Lymph nodes:   Cervical, supraclavicular, and axillary nodes normal  Neurologic:   CNII-XII intact, normal strength, sensation and reflexes    throughout    Assessment & Plan:   Problem List Items Addressed This Visit    Obesity (BMI 30-39.9)    I have congratulated  her in reduction of   BMI and encouraged  Continued weight loss with goal of 10% of body weigh over the next 6 months using a low glycemic index diet and regular exercise a minimum of 5 days per week.        Graves disease    Currently  Unmedicated.   Her TSH is  suppressed but her T4 is normal .  No medications prescribed today.  Repeat in 6 months    Lab Results  Component Value Date   TSH <0.006 (L) 08/25/2016         Relevant Orders   T4 AND TSH (Completed)   Insomnia secondary to anxiety   Relevant Medications   ALPRAZolam (XANAX) 0.5 MG tablet   Vitamin D deficiency   Relevant Orders   VITAMIN D 25 Hydroxy (Vit-D Deficiency, Fractures) (Completed)    Other Visit Diagnoses    Welcome to Medicare preventive visit    -  Primary   Relevant Orders   EKG 12-Lead (Completed)   Encounter for screening mammogram for breast cancer       Cervical cancer screening       Relevant Orders   Cytology - PAP   Hyperlipidemia       Relevant Orders   Lipid Panel w/Direct LDL:HDL Ratio (Completed)   Long-term use of high-risk medication       Relevant Orders   Comprehensive metabolic panel (Completed)      I have discontinued Ms. Glaub HYDROcodone-acetaminophen, promethazine, and terconazole. I have also changed her acyclovir. Additionally, I am having her start on diclofenac and ALPRAZolam. Lastly, I am having her maintain her albuterol, aspirin EC, and esomeprazole.  Meds ordered this encounter  Medications  . diclofenac (VOLTAREN) 75 MG EC tablet    Sig: Take 1 tablet (75 mg total) by mouth 2 (two) times daily.    Dispense:  60 tablet    Refill:  2  . acyclovir (ZOVIRAX) 400 MG tablet    Sig: Take 1 tablet (400 mg total) by mouth daily.    Dispense:  90 tablet    Refill:  3  . ALPRAZolam (XANAX) 0.5 MG tablet    Sig: Take 1 tablet (0.5 mg total) by mouth at bedtime as needed for anxiety.    Dispense:  30 tablet    Refill:  3    Medications Discontinued During This  Encounter  Medication Reason  . HYDROcodone-acetaminophen (NORCO) 5-325 MG tablet Error  . promethazine (PHENERGAN) 12.5 MG tablet Error  . terconazole (TERAZOL 3) 0.8 % vaginal cream Error  . acyclovir (ZOVIRAX) 400 MG tablet Reorder    Follow-up: No Follow-up on file.  [name], MD

## 2016-08-25 NOTE — Progress Notes (Signed)
Pre-visit discussion using our clinic review tool. No additional management support is needed unless otherwise documented below in the visit note.  

## 2016-08-25 NOTE — Patient Instructions (Signed)
Congratulations!!!!!   Here are a few ways to indulge without blowing it:  For dessert:  Try the Dannon Lt [employer] Fit greek yogurt dessert flavors and top with reddi Whip .  8 carbs,  80 calories  E Enlighten se salt caramel ice cream bars (Only at [employer]) Halo Top ice cream Breyer's Carb Smart fudge bars and Ice cream Bars    Schiff makes a dark chocolate flavored probiotic  That is delicious!!  Menopause is a normal process in which your reproductive ability comes to an end. This process happens gradually over a span of months to years, usually between the ages of 61 and 50. Menopause is complete when you have missed 12 consecutive menstrual periods. It is important to talk with your health care provider about some of the most common conditions that affect postmenopausal women, such as heart disease, cancer, and bone loss (osteoporosis). Adopting a healthy lifestyle and getting preventive care can help to promote your health and wellness. Those actions can also lower your chances of developing some of these common conditions. WHAT SHOULD I KNOW ABOUT MENOPAUSE? During menopause, you may experience a number of symptoms, such as:  Moderate-to-severe hot flashes.  Night sweats.  Decrease in sex drive.  Mood swings.  Headaches.  Tiredness.  Irritability.  Memory problems.  Insomnia. Choosing to treat or not to treat menopausal changes is an individual decision that you make with your health care provider. WHAT SHOULD I KNOW ABOUT HORMONE REPLACEMENT THERAPY AND SUPPLEMENTS? Hormone therapy products are effective for treating symptoms that are associated with menopause, such as hot flashes and night sweats. Hormone replacement carries certain risks, especially as you become older. If you are thinking about using estrogen or estrogen with progestin treatments, discuss the benefits and risks with your health care provider. WHAT SHOULD I KNOW ABOUT HEART DISEASE AND STROKE? Heart disease,  heart attack, and stroke become more likely as you age. This may be due, in part, to the hormonal changes that your body experiences during menopause. These can affect how your body processes dietary fats, triglycerides, and cholesterol. Heart attack and stroke are both medical emergencies. There are many things that you can do to help prevent heart disease and stroke:  Have your blood pressure checked at least every 1-2 years. High blood pressure causes heart disease and increases the risk of stroke.  If you are 108-81 years old, ask your health care provider if you should take aspirin to prevent a heart attack or a stroke.  Do not use any tobacco products, including cigarettes, chewing tobacco, or electronic cigarettes. If you need help quitting, ask your health care provider.  It is important to eat a healthy diet and maintain a healthy weight.  Be sure to include plenty of vegetables, fruits, low-fat dairy products, and lean protein.  Avoid eating foods that are high in solid fats, added sugars, or salt (sodium).  Get regular exercise. This is one of the most important things that you can do for your health.  Try to exercise for at least 150 minutes each week. The type of exercise that you do should increase your heart rate and make you sweat. This is known as moderate-intensity exercise.  Try to do strengthening exercises at least twice each week. Do these in addition to the moderate-intensity exercise.  Know your numbers.Ask your health care provider to check your cholesterol and your blood glucose. Continue to have your blood tested as directed by your health care provider. WHAT SHOULD  I KNOW ABOUT CANCER SCREENING? There are several types of cancer. Take the following steps to reduce your risk and to catch any cancer development as early as possible. Breast Cancer  Practice breast self-awareness.  This means understanding how your breasts normally appear and feel.  It also means  doing regular breast self-exams. Let your health care provider know about any changes, no matter how small.  If you are 54 or older, have a clinician do a breast exam (clinical breast exam or CBE) every year. Depending on your age, family history, and medical history, it may be recommended that you also have a yearly breast X-ray (mammogram).  If you have a family history of breast cancer, talk with your health care provider about genetic screening.  If you are at high risk for breast cancer, talk with your health care provider about having an MRI and a mammogram every year.  Breast cancer (BRCA) gene test is recommended for women who have family members with BRCA-related cancers. Results of the assessment will determine the need for genetic counseling and BRCA1 and for BRCA2 testing. BRCA-related cancers include these types:  Breast. This occurs in males or females.  Ovarian.  Tubal. This may also be called fallopian tube cancer.  Cancer of the abdominal or pelvic lining (peritoneal cancer).  Prostate.  Pancreatic. Cervical, Uterine, and Ovarian Cancer Your health care provider may recommend that you be screened regularly for cancer of the pelvic organs. These include your ovaries, uterus, and vagina. This screening involves a pelvic exam, which includes checking for microscopic changes to the surface of your cervix (Pap test).  For women ages 21-65, health care providers may recommend a pelvic exam and a Pap test every three years. For women ages 38-65, they may recommend the Pap test and pelvic exam, combined with testing for human papilloma virus (HPV), every five years. Some types of HPV increase your risk of cervical cancer. Testing for HPV may also be done on women of any age who have unclear Pap test results.  Other health care providers may not recommend any screening for nonpregnant women who are considered low risk for pelvic cancer and have no symptoms. Ask your health care  provider if a screening pelvic exam is right for you.  If you have had past treatment for cervical cancer or a condition that could lead to cancer, you need Pap tests and screening for cancer for at least 20 years after your treatment. If Pap tests have been discontinued for you, your risk factors (such as having a new sexual partner) need to be reassessed to determine if you should start having screenings again. Some women have medical problems that increase the chance of getting cervical cancer. In these cases, your health care provider may recommend that you have screening and Pap tests more often.  If you have a family history of uterine cancer or ovarian cancer, talk with your health care provider about genetic screening.  If you have vaginal bleeding after reaching menopause, tell your health care provider.  There are currently no reliable tests available to screen for ovarian cancer. Lung Cancer Lung cancer screening is recommended for adults 8-45 years old who are at high risk for lung cancer because of a history of smoking. A yearly low-dose CT scan of the lungs is recommended if you:  Currently smoke.  Have a history of at least 30 pack-years of smoking and you currently smoke or have quit within the past 15 years. A pack-year  is smoking an average of one pack of cigarettes per day for one year. Yearly screening should:  Continue until it has been 15 years since you quit.  Stop if you develop a health problem that would prevent you from having lung cancer treatment. Colorectal Cancer  This type of cancer can be detected and can often be prevented.  Routine colorectal cancer screening usually begins at age 81 and continues through age 91.  If you have risk factors for colon cancer, your health care provider may recommend that you be screened at an earlier age.  If you have a family history of colorectal cancer, talk with your health care provider about genetic screening.  Your  health care provider may also recommend using home test kits to check for hidden blood in your stool.  A small camera at the end of a tube can be used to examine your colon directly (sigmoidoscopy or colonoscopy). This is done to check for the earliest forms of colorectal cancer.  Direct examination of the colon should be repeated every 5-10 years until age 30. However, if early forms of precancerous polyps or small growths are found or if you have a family history or genetic risk for colorectal cancer, you may need to be screened more often. Skin Cancer  Check your skin from head to toe regularly.  Monitor any moles. Be sure to tell your health care provider:  About any new moles or changes in moles, especially if there is a change in a mole's shape or color.  If you have a mole that is larger than the size of a pencil eraser.  If any of your family members has a history of skin cancer, especially at a young age, talk with your health care provider about genetic screening.  Always use sunscreen. Apply sunscreen liberally and repeatedly throughout the day.  Whenever you are outside, protect yourself by wearing long sleeves, pants, a wide-brimmed hat, and sunglasses. WHAT SHOULD I KNOW ABOUT OSTEOPOROSIS? Osteoporosis is a condition in which bone destruction happens more quickly than new bone creation. After menopause, you may be at an increased risk for osteoporosis. To help prevent osteoporosis or the bone fractures that can happen because of osteoporosis, the following is recommended:  If you are 51-63 years old, get at least 1,000 mg of calcium and at least 600 mg of vitamin D per day.  If you are older than age 65 but younger than age 61, get at least 1,200 mg of calcium and at least 600 mg of vitamin D per day.  If you are older than age 35, get at least 1,200 mg of calcium and at least 800 mg of vitamin D per day. Smoking and excessive alcohol intake increase the risk of  osteoporosis. Eat foods that are rich in calcium and vitamin D, and do weight-bearing exercises several times each week as directed by your health care provider. WHAT SHOULD I KNOW ABOUT HOW MENOPAUSE AFFECTS Pope? Depression may occur at any age, but it is more common as you become older. Common symptoms of depression include:  Low or sad mood.  Changes in sleep patterns.  Changes in appetite or eating patterns.  Feeling an overall lack of motivation or enjoyment of activities that you previously enjoyed.  Frequent crying spells. Talk with your health care provider if you think that you are experiencing depression. WHAT SHOULD I KNOW ABOUT IMMUNIZATIONS? It is important that you get and maintain your immunizations. These include:  Tetanus, diphtheria, and pertussis (Tdap) booster vaccine.  Influenza every year before the flu season begins.  Pneumonia vaccine.  Shingles vaccine. Your health care provider may also recommend other immunizations.   This information is not intended to replace advice given to you by your health care provider. Make sure you discuss any questions you have with your health care provider.   Document Released: 02/04/2006 Document Revised: 01/03/2015 Document Reviewed: 08/15/2014 Elsevier Interactive Patient Education [employer].

## 2016-08-26 LAB — COMPREHENSIVE METABOLIC PANEL
ALT: 29 U/L (ref 0–35)
AST: 37 U/L (ref 0–37)
Albumin: 4.3 g/dL (ref 3.5–5.2)
Alkaline Phosphatase: 63 U/L (ref 39–117)
BUN: 15 mg/dL (ref 6–23)
CO2: 27 mEq/L (ref 19–32)
Calcium: 9.6 mg/dL (ref 8.4–10.5)
Chloride: 101 mEq/L (ref 96–112)
Creatinine, Ser: 0.94 mg/dL (ref 0.40–1.20)
GFR: 76.58 mL/min (ref >60.00)
Glucose, Bld: 84 mg/dL (ref 70–99)
Potassium: 4.4 mEq/L (ref 3.5–5.1)
Sodium: 136 mEq/L (ref 135–145)
Total Bilirubin: 0.7 mg/dL (ref 0.2–1.2)
Total Protein: 7.2 g/dL (ref 6.0–8.3)

## 2016-08-26 LAB — VITAMIN D 25 HYDROXY (VIT D DEFICIENCY, FRACTURES): VITD: 25.81 ng/mL — ABNORMAL LOW (ref 30.00–100.00)

## 2016-08-27 ENCOUNTER — Ambulatory Visit: Payer: PPO | Attending: Internal Medicine

## 2016-08-27 LAB — LIPID PANEL W/DIRECT LDL/HDL RATIO
Cholesterol: 223 mg/dL — ABNORMAL HIGH (ref 125–200)
Direct LDL: 151 mg/dL — ABNORMAL HIGH (ref <130)
HDL: 69 mg/dL (ref >=46)
LDL:HDL Ratio: 2.2 Ratio
Total Chol/HDL Ratio: 3.2 Ratio (ref <=5.0)
Triglycerides: 111 mg/dL (ref <150)

## 2016-08-27 LAB — T4 AND TSH
T4, Total: 10.5 ug/dL (ref 4.5–12.0)
TSH: <0.006 u[IU]/mL — ABNORMAL LOW (ref 0.450–4.500)

## 2016-08-28 NOTE — Assessment & Plan Note (Signed)
Welcome to Medicare wellness  exam was done as well as a comprehensive physical exam and management of acute and chronic conditions .  During the course of the visit the patient was educated and counseled about appropriate screening and preventive services including : fall prevention , diabetes screening, nutrition counseling, colorectal cancer screening, and recommended immunizations.  Printed recommendations for health maintenance screenings was given.  

## 2016-08-28 NOTE — Assessment & Plan Note (Signed)
I have congratulated her in reduction of   BMI and encouraged  Continued weight loss with goal of 10% of body weigh over the next 6 months using a low glycemic index diet and regular exercise a minimum of 5 days per week.    

## 2016-08-28 NOTE — Assessment & Plan Note (Addendum)
Currently  Unmedicated.   Her TSH is  suppressed but her T4 is normal .  No medications prescribed today.  Repeat in 6 months    Lab Results  Component Value Date   TSH <0.006 (L) 08/25/2016

## 2016-08-30 ENCOUNTER — Encounter: Payer: Self-pay | Admitting: Internal Medicine

## 2016-08-31 ENCOUNTER — Telehealth: Payer: Self-pay | Admitting: *Deleted

## 2016-08-31 NOTE — Telephone Encounter (Signed)
Spoke with patient, my chart message was sent, patient was unable to access, reviewed with her and she will start the D3 supplement. thanks

## 2016-08-31 NOTE — Telephone Encounter (Signed)
Patient has requested test results from her physical Pt contact [phone]

## 2016-09-02 ENCOUNTER — Encounter: Payer: Self-pay | Admitting: Internal Medicine

## 2016-09-02 ENCOUNTER — Other Ambulatory Visit: Payer: Self-pay | Admitting: Internal Medicine

## 2016-09-02 DIAGNOSIS — B373 Candidiasis of vulva and vagina: Secondary | ICD-10-CM | POA: Insufficient documentation

## 2016-09-02 DIAGNOSIS — B3731 Acute candidiasis of vulva and vagina: Secondary | ICD-10-CM | POA: Insufficient documentation

## 2016-09-02 LAB — CYTOLOGY - PAP

## 2016-09-02 MED ORDER — FLUCONAZOLE 150 MG PO TABS: 150.0000 mg | ORAL_TABLET | Freq: Every day | ORAL | 0 refills | Status: DC

## 2016-09-14 NOTE — Telephone Encounter (Signed)
Mailed unread message to patient, thanks 

## 2016-09-16 NOTE — Telephone Encounter (Signed)
Mailed unread message to patient.  

## 2016-09-21 ENCOUNTER — Ambulatory Visit
Admission: RE | Admit: 2016-09-21 | Discharge: 2016-09-21 | Disposition: A | Discharge status: Home / Self Care | Payer: PPO | Source: Ambulatory Visit | Attending: Internal Medicine | Admitting: Internal Medicine

## 2016-09-21 DIAGNOSIS — Z1231 Encounter for screening mammogram for malignant neoplasm of breast: Secondary | ICD-10-CM

## 2016-09-24 ENCOUNTER — Telehealth: Payer: Self-pay | Admitting: *Deleted

## 2016-09-24 NOTE — Telephone Encounter (Signed)
Patient requested a call to discuss her lab results and her new medication Pt contact [phone]

## 2016-09-24 NOTE — Telephone Encounter (Signed)
Patient notified that candida is type of yeast and not a bacterial infection and that diflucan continue to work after treatment but if patient continue to notice a discharge to call back to office and we will set up an appointment to see PCP.

## 2016-10-07 ENCOUNTER — Ambulatory Visit (INDEPENDENT_AMBULATORY_CARE_PROVIDER_SITE_OTHER): Payer: PPO | Admitting: Family

## 2016-10-07 ENCOUNTER — Encounter: Payer: Self-pay | Admitting: Family

## 2016-10-07 VITALS — BP 122/78 | HR 67 | Temp 98.7°F | Wt 164.0 lb

## 2016-10-07 DIAGNOSIS — R059 Cough, unspecified: Secondary | ICD-10-CM

## 2016-10-07 DIAGNOSIS — R05 Cough: Secondary | ICD-10-CM

## 2016-10-07 DIAGNOSIS — H65191 Other acute nonsuppurative otitis media, right ear: Secondary | ICD-10-CM

## 2016-10-07 MED ORDER — ALBUTEROL SULFATE HFA 108 (90 BASE) MCG/ACT IN AERS: 2.0000 | INHALATION_SPRAY | Freq: Four times a day (QID) | RESPIRATORY_TRACT | 2 refills | Status: DC | PRN

## 2016-10-07 MED ORDER — AMOXICILLIN 500 MG PO CAPS: 500.0000 mg | ORAL_CAPSULE | Freq: Two times a day (BID) | ORAL | 0 refills | Status: DC

## 2016-10-07 NOTE — Progress Notes (Signed)
Subjective:    Patient ID: Katelyn Friedman, female    DOB: 06-Oct-1951, 65 y.o.   MRN: [id number]  CC: Katelyn Friedman is a 65 y.o. female who presents today for an acute visit.    HPI: Patient here for acute visit with chief complaint of sinus congestion. Endorses sinus pressure low grade fever 100.5, ear pressure,  Headache, and sore throat. Dry cough. Katelyn Friedman is not ' the worst HA of life. ' Tried affrin and tyelonol without much relief. No chills, SOB.history of seasonal allergies. Requests refill of inhaler.     HISTORY:  Past Medical History:  Diagnosis Date  . Allergic rhinitis   . Arthritis   . Asthma   . Family history of adverse reaction to anesthesia    PONV  . GERD (gastroesophageal reflux disease)   . Herpes simplex   . History of colon polyps   . IBS (irritable bowel syndrome)   . PONV (postoperative nausea and vomiting)   . Thyroid disease    graves disease   Past Surgical History:  Procedure Laterality Date  . BREAST BIOPSY Right 2014   negative  . COLONOSCOPY  2006  . KNEE ARTHROSCOPY  1992  . KNEE ARTHROSCOPY Right 06/08/2016   Procedure: ARTHROSCOPY KNEE- partial medial minisectomy and condroplasty;  Surgeon: Katelyn Nakayama, MD;  Location: [facility];  Service: Orthopedics;  Laterality: Right;  . ROTATOR CUFF REPAIR  2006   Katelyn Friedman   . TONSILLECTOMY    . TUBAL LIGATION     Family History  Problem Relation Age of Onset  . Stroke Mother   . Dementia Father   . Colon polyps Maternal Aunt   . Cancer Maternal Aunt     colon  . Breast cancer Neg Hx     Allergies: Influenza vaccines; Codeine; and Mobic [meloxicam] Current Outpatient Prescriptions on File Prior to Visit  Medication Sig Dispense Refill  . acyclovir (ZOVIRAX) 400 MG tablet Take 1 tablet (400 mg total) by mouth daily. 90 tablet 3  . albuterol (PROVENTIL HFA;VENTOLIN HFA) 108 (90 BASE) MCG/ACT inhaler Inhale 2 puffs into the lungs every 6 (six) hours as needed for wheezing or shortness of  breath. 1 Inhaler 2  . ALPRAZolam (XANAX) 0.5 MG tablet Take 1 tablet (0.5 mg total) by mouth at bedtime as needed for anxiety. 30 tablet 3  . aspirin EC 81 MG tablet Take 81 mg by mouth every other day.     . diclofenac (VOLTAREN) 75 MG EC tablet Take 1 tablet (75 mg total) by mouth 2 (two) times daily. 60 tablet 2  . esomeprazole (NEXIUM) 20 MG capsule Take 20 mg by mouth daily as needed (For heartburn or acid reflux.).    [name] fluconazole (DIFLUCAN) 150 MG tablet Take 1 tablet (150 mg total) by mouth daily. (Patient not taking: Reported on 10/07/2016) 2 tablet 0   No current facility-administered medications on file prior to visit.     Social History  Substance Use Topics  . Smoking status: Never Smoker  . Smokeless tobacco: Never Used  . Alcohol use No    Review of Systems  Constitutional: Positive for fever (low grade). Negative for chills.  HENT: Positive for congestion, ear pain, sinus pressure and sore throat.   Eyes: Negative for visual disturbance.  Respiratory: Positive for cough. Negative for shortness of breath and wheezing.   Cardiovascular: Negative for chest pain and palpitations.  Gastrointestinal: Negative for nausea and vomiting.  Neurological: Positive for headaches.  Objective:    BP 122/78   Pulse 67   Temp 98.7 F (37.1 C) (Oral)   Wt 164 lb (74.4 kg)   SpO2 98%   BMI 29.52 kg/m    Physical Exam  Constitutional: She appears well-developed and well-nourished.  HENT:  Head: Normocephalic and atraumatic.  Right Ear: Hearing, external ear and ear canal normal. No drainage, swelling or tenderness. No foreign bodies. Tympanic membrane is erythematous. Tympanic membrane is not bulging. No middle ear effusion. No decreased hearing is noted.  Left Ear: Hearing, tympanic membrane, external ear and ear canal normal. No drainage, swelling or tenderness. No foreign bodies. Tympanic membrane is not erythematous and not bulging.  No middle ear effusion. No  decreased hearing is noted.  Nose: Nose normal. No rhinorrhea. Right sinus exhibits no maxillary sinus tenderness and no frontal sinus tenderness. Left sinus exhibits no maxillary sinus tenderness and no frontal sinus tenderness.  Mouth/Throat: Uvula is midline, oropharynx is clear and moist and mucous membranes are normal. No oropharyngeal exudate, posterior oropharyngeal edema, posterior oropharyngeal erythema or tonsillar abscesses.  Eyes: Conjunctivae are normal.  Cardiovascular: Regular rhythm, normal heart sounds and normal pulses.   Pulmonary/Chest: Effort normal and breath sounds normal. She has no wheezes. She has no rhonchi. She has no rales.  Lymphadenopathy:       Head (right side): No submental, no submandibular, no tonsillar, no preauricular, no posterior auricular and no occipital adenopathy present.       Head (left side): No submental, no submandibular, no tonsillar, no preauricular, no posterior auricular and no occipital adenopathy present.    She has no cervical adenopathy.  Neurological: She is alert.  Skin: Skin is warm and dry.  Psychiatric: She has a normal mood and affect. Her speech is normal and behavior is normal. Thought content normal.  Vitals reviewed.      Assessment & Plan:   1. Cough No adventitious lung sounds. Afebrile. History of asthma. Refilled inhaler at patient's request. - albuterol (PROVENTIL HFA;VENTOLIN HFA) 108 (90 Base) MCG/ACT inhaler; Inhale 2 puffs into the lungs every 6 (six) hours as needed for wheezing or shortness of breath.  Dispense: 1 Inhaler; Refill: 2  2. Other acute nonsuppurative otitis media of right ear, recurrence not specified Symptoms consistent with acute otitis media and concurrent sinusitis. Start amoxicillin. Return precautions given.    I am having [name] maintain her albuterol, aspirin EC, esomeprazole, diclofenac, acyclovir, ALPRAZolam, and fluconazole.   No orders of the defined types were placed in this  encounter.   Return precautions given.   Risks, benefits, and alternatives of the medications and treatment plan prescribed today were discussed, and patient expressed understanding.   Education regarding symptom management and diagnosis given to patient on AVS.  Continue to follow with TULLO, Aris Everts, MD for routine health maintenance.   Katelyn Friedman and I agreed with plan.   Mable Paris, FNP

## 2016-10-07 NOTE — Patient Instructions (Signed)

## 2016-10-08 ENCOUNTER — Ambulatory Visit: Payer: PPO | Admitting: Family Medicine

## 2016-10-14 ENCOUNTER — Other Ambulatory Visit: Payer: Self-pay | Admitting: Family

## 2016-10-14 ENCOUNTER — Telehealth: Payer: Self-pay

## 2016-10-14 DIAGNOSIS — R05 Cough: Secondary | ICD-10-CM

## 2016-10-14 DIAGNOSIS — R059 Cough, unspecified: Secondary | ICD-10-CM

## 2016-10-14 MED ORDER — ALBUTEROL SULFATE HFA 108 (90 BASE) MCG/ACT IN AERS: 2.0000 | INHALATION_SPRAY | Freq: Four times a day (QID) | RESPIRATORY_TRACT | 1 refills | Status: DC | PRN

## 2016-10-14 NOTE — Telephone Encounter (Signed)
Patients pharmacy faxed over RX stating that patient would like to switch proventil to pro air. Please advise.

## 2017-07-18 ENCOUNTER — Emergency Department: Payer: PPO

## 2017-07-18 ENCOUNTER — Observation Stay
Admission: EM | Admit: 2017-07-18 | Discharge: 2017-07-19 | Disposition: A | Discharge status: Home / Self Care | Payer: PPO | Attending: Internal Medicine | Admitting: Internal Medicine

## 2017-07-18 ENCOUNTER — Encounter: Payer: Self-pay | Admitting: Internal Medicine

## 2017-07-18 ENCOUNTER — Telehealth: Payer: Self-pay | Admitting: *Deleted

## 2017-07-18 DIAGNOSIS — Z8489 Family history of other specified conditions: Secondary | ICD-10-CM | POA: Insufficient documentation

## 2017-07-18 DIAGNOSIS — R12 Heartburn: Secondary | ICD-10-CM | POA: Diagnosis not present

## 2017-07-18 DIAGNOSIS — M1990 Unspecified osteoarthritis, unspecified site: Secondary | ICD-10-CM | POA: Insufficient documentation

## 2017-07-18 DIAGNOSIS — F419 Anxiety disorder, unspecified: Secondary | ICD-10-CM | POA: Diagnosis not present

## 2017-07-18 DIAGNOSIS — F5105 Insomnia due to other mental disorder: Secondary | ICD-10-CM | POA: Insufficient documentation

## 2017-07-18 DIAGNOSIS — J45909 Unspecified asthma, uncomplicated: Secondary | ICD-10-CM | POA: Insufficient documentation

## 2017-07-18 DIAGNOSIS — Z887 Allergy status to serum and vaccine status: Secondary | ICD-10-CM | POA: Insufficient documentation

## 2017-07-18 DIAGNOSIS — Z7951 Long term (current) use of inhaled steroids: Secondary | ICD-10-CM | POA: Insufficient documentation

## 2017-07-18 DIAGNOSIS — R079 Chest pain, unspecified: Principal | ICD-10-CM

## 2017-07-18 DIAGNOSIS — Z823 Family history of stroke: Secondary | ICD-10-CM | POA: Insufficient documentation

## 2017-07-18 DIAGNOSIS — I200 Unstable angina: Secondary | ICD-10-CM

## 2017-07-18 DIAGNOSIS — E0500 Thyrotoxicosis with diffuse goiter without thyrotoxic crisis or storm: Secondary | ICD-10-CM | POA: Diagnosis not present

## 2017-07-18 DIAGNOSIS — Z8249 Family history of ischemic heart disease and other diseases of the circulatory system: Secondary | ICD-10-CM | POA: Insufficient documentation

## 2017-07-18 DIAGNOSIS — K589 Irritable bowel syndrome without diarrhea: Secondary | ICD-10-CM | POA: Diagnosis not present

## 2017-07-18 DIAGNOSIS — Z885 Allergy status to narcotic agent status: Secondary | ICD-10-CM | POA: Insufficient documentation

## 2017-07-18 DIAGNOSIS — Z886 Allergy status to analgesic agent status: Secondary | ICD-10-CM | POA: Insufficient documentation

## 2017-07-18 DIAGNOSIS — Z86010 Personal history of colonic polyps: Secondary | ICD-10-CM | POA: Insufficient documentation

## 2017-07-18 DIAGNOSIS — E669 Obesity, unspecified: Secondary | ICD-10-CM | POA: Diagnosis not present

## 2017-07-18 DIAGNOSIS — F458 Other somatoform disorders: Secondary | ICD-10-CM | POA: Insufficient documentation

## 2017-07-18 DIAGNOSIS — B009 Herpesviral infection, unspecified: Secondary | ICD-10-CM | POA: Insufficient documentation

## 2017-07-18 DIAGNOSIS — Z8371 Family history of colonic polyps: Secondary | ICD-10-CM | POA: Diagnosis not present

## 2017-07-18 DIAGNOSIS — Z6832 Body mass index (BMI) 32.0-32.9, adult: Secondary | ICD-10-CM | POA: Diagnosis not present

## 2017-07-18 DIAGNOSIS — Z79899 Other long term (current) drug therapy: Secondary | ICD-10-CM | POA: Diagnosis not present

## 2017-07-18 DIAGNOSIS — Z8673 Personal history of transient ischemic attack (TIA), and cerebral infarction without residual deficits: Secondary | ICD-10-CM | POA: Insufficient documentation

## 2017-07-18 DIAGNOSIS — Z7982 Long term (current) use of aspirin: Secondary | ICD-10-CM | POA: Diagnosis not present

## 2017-07-18 DIAGNOSIS — K219 Gastro-esophageal reflux disease without esophagitis: Secondary | ICD-10-CM | POA: Diagnosis not present

## 2017-07-18 DIAGNOSIS — Z818 Family history of other mental and behavioral disorders: Secondary | ICD-10-CM | POA: Insufficient documentation

## 2017-07-18 DIAGNOSIS — M479 Spondylosis, unspecified: Secondary | ICD-10-CM | POA: Diagnosis not present

## 2017-07-18 DIAGNOSIS — Z800 Family history of malignant neoplasm of digestive organs: Secondary | ICD-10-CM | POA: Insufficient documentation

## 2017-07-18 LAB — TROPONIN I
Troponin I: <0.03 ng/mL (ref <0.03)
Troponin I: <0.03 ng/mL (ref <0.03)
Troponin I: <0.03 ng/mL (ref <0.03)

## 2017-07-18 LAB — BASIC METABOLIC PANEL
Anion gap: 8 (ref 5–15)
BUN: 16 mg/dL (ref 6–20)
CO2: 26 mmol/L (ref 22–32)
Calcium: 9.1 mg/dL (ref 8.9–10.3)
Chloride: 106 mmol/L (ref 101–111)
Creatinine, Ser: 0.98 mg/dL (ref 0.44–1.00)
GFR calc Af Amer: >60 mL/min (ref >60)
GFR calc non Af Amer: 58 mL/min — ABNORMAL LOW (ref >60)
Glucose, Bld: 104 mg/dL — ABNORMAL HIGH (ref 65–99)
Potassium: 3.9 mmol/L (ref 3.5–5.1)
Sodium: 140 mmol/L (ref 135–145)

## 2017-07-18 LAB — CBC
HCT: 40.6 % (ref 35.0–47.0)
Hemoglobin: 13.4 g/dL (ref 12.0–16.0)
MCH: 28.7 pg (ref 26.0–34.0)
MCHC: 33 g/dL (ref 32.0–36.0)
MCV: 87.1 fL (ref 80.0–100.0)
Platelets: 239 10*3/uL (ref 150–440)
RBC: 4.66 MIL/uL (ref 3.80–5.20)
RDW: 16.1 % — ABNORMAL HIGH (ref 11.5–14.5)
WBC: 8.6 10*3/uL (ref 3.6–11.0)

## 2017-07-18 MED ORDER — ALBUTEROL SULFATE HFA 108 (90 BASE) MCG/ACT IN AERS: 2.0000 | INHALATION_SPRAY | Freq: Four times a day (QID) | RESPIRATORY_TRACT | Status: DC | PRN

## 2017-07-18 MED ORDER — ACYCLOVIR 200 MG PO CAPS
400.0000 mg | ORAL_CAPSULE | Freq: Every day | ORAL | Status: DC
  Administered 2017-07-19: 400 mg via ORAL
  Filled 2017-07-18: qty 2

## 2017-07-18 MED ORDER — ASPIRIN 81 MG PO CHEW
162.0000 mg | CHEWABLE_TABLET | Freq: Once | ORAL | Status: AC
  Administered 2017-07-18: 162 mg via ORAL
  Filled 2017-07-18: qty 2

## 2017-07-18 MED ORDER — NITROGLYCERIN 0.4 MG SL SUBL: 0.4000 mg | SUBLINGUAL_TABLET | SUBLINGUAL | Status: DC | PRN

## 2017-07-18 MED ORDER — HEPARIN BOLUS VIA INFUSION
4000.0000 [IU] | Freq: Once | INTRAVENOUS | Status: AC
  Administered 2017-07-18: 4000 [IU] via INTRAVENOUS
  Filled 2017-07-18: qty 4000

## 2017-07-18 MED ORDER — PANTOPRAZOLE SODIUM 40 MG PO TBEC
40.0000 mg | DELAYED_RELEASE_TABLET | Freq: Every day | ORAL | Status: DC
  Administered 2017-07-18 – 2017-07-19 (×2): 40 mg via ORAL
  Filled 2017-07-18 (×2): qty 1

## 2017-07-18 MED ORDER — ASPIRIN EC 81 MG PO TBEC
81.0000 mg | DELAYED_RELEASE_TABLET | Freq: Every day | ORAL | Status: DC
  Administered 2017-07-19: 81 mg via ORAL
  Filled 2017-07-18: qty 1

## 2017-07-18 MED ORDER — HEPARIN (PORCINE) IN NACL 100-0.45 UNIT/ML-% IJ SOLN
800.0000 [IU]/h | INTRAMUSCULAR | Status: DC
  Administered 2017-07-18: 850 [IU]/h via INTRAVENOUS
  Filled 2017-07-18: qty 250

## 2017-07-18 MED ORDER — ACYCLOVIR 400 MG PO TABS: 400.0000 mg | ORAL_TABLET | Freq: Every day | ORAL | Status: DC

## 2017-07-18 MED ORDER — NITROGLYCERIN 0.4 MG SL SUBL: 0.4000 mg | SUBLINGUAL_TABLET | Freq: Once | SUBLINGUAL | Status: DC

## 2017-07-18 MED ORDER — ALBUTEROL SULFATE (2.5 MG/3ML) 0.083% IN NEBU: 2.5000 mg | INHALATION_SOLUTION | Freq: Four times a day (QID) | RESPIRATORY_TRACT | Status: DC | PRN

## 2017-07-18 MED ORDER — ALPRAZOLAM 0.5 MG PO TABS: 0.5000 mg | ORAL_TABLET | Freq: Every evening | ORAL | Status: DC | PRN

## 2017-07-18 NOTE — Telephone Encounter (Signed)
Patient Name: Katelyn Friedman DOB: 01-16-1951 Initial Comment Caller states, [facility] - pt. has chest pain with back pain tingling in left arm. Nurse Assessment Nurse: Ronnald Ramp, RN, [facility] Date/Time (Eastern Time): 07/18/2017 8:48:25 AM Confirm and document reason for call. If symptomatic, describe symptoms. ---Caller states she woke up around 5 am with chest pain, pain in her back, and tingling in her left arm. The pain in her chest has subsided but still having tingling in her left arm. The pain lasted for 30-45 min. BP upto 169-93 (normal 120/80) Does the patient have any new or worsening symptoms? ---Yes Will a triage be completed? ---Yes Related visit to physician within the last 2 weeks? ---No Does the PT have any chronic conditions? (i.e. diabetes, asthma, etc.) ---No Is this a behavioral health or substance abuse call? ---No Guidelines Guideline Title Affirmed Question Affirmed Notes Chest Pain Pain also present in shoulder(s) or arm(s) or jaw (Exception: pain is clearly made worse by movement) Final Disposition User Go to ED Now Ronnald Ramp, RN, [facility] - ED Disagree/Comply: Comply

## 2017-07-18 NOTE — ED Provider Notes (Addendum)
[facility] Emergency Department Provider Note  ____________________________________________   First MD Initiated Contact with Patient 07/18/17 1545     (approximate)  I have reviewed the triage vital signs and the nursing notes.   HISTORY  Chief Complaint Chest Pain    HPI Katelyn Friedman is a 66 y.o. female with medical history as listed below but no known cardiac history who presents for evaluation of chest pain.  She reports that she had an episode of chest pain yesterday but that it was relatively brief, lasting about 15 minutes, and resolved on its own.  This morning at about 5 AM she awoke from sleep with central and left-sided sharp and aching and pressure-like pain that radiated to her back with some tingling in her left arm.  It lasted about an hour and a half before it improved significantly but has not completely resolved.  She still feels a very slight twinge.  She reports that it was moderate initially and has almost resolved at this point.  Nothing in particular makes the patient's symptoms better nor worse.  It was not accompanied with shortness of breath, nausea, vomiting, nor abdominal pain.  Chest denies fever/chills, cough, lightheadedness, dizziness, dysuria.  She has no for sure relatives with a history of ACS/MI.  She does not have diabetes, no tobacco history, no hypertension, no hyperlipidemia.   Past Medical History:  Diagnosis Date  . Allergic rhinitis   . Arthritis   . Asthma   . Family history of adverse reaction to anesthesia    PONV  . GERD (gastroesophageal reflux disease)   . Herpes simplex   . History of colon polyps   . IBS (irritable bowel syndrome)   . PONV (postoperative nausea and vomiting)   . Thyroid disease    graves disease    Patient Active Problem List   Diagnosis Date Noted  . Chest pain 07/18/2017  . Candida vaginitis 09/02/2016  . Vaginal irritation 05/12/2016  . Globus sensation 01/05/2016  .  Right knee pain 12/22/2015  . Welcome to Medicare preventive visit 03/05/2015  . Encounter for screening colonoscopy 03/26/2014  . Graves disease 09/25/2013  . Vitamin D deficiency 08/04/2013  . Transient cerebral ischemia 07/20/2013  . Obesity (BMI 30-39.9) 05/01/2012  . 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 Arthritis   . Allergic rhinitis   . Herpes simplex   . History of colon polyps     Past Surgical History:  Procedure Laterality Date  . BREAST BIOPSY Right 2014   negative  . COLONOSCOPY  2006  . KNEE ARTHROSCOPY  1992  . KNEE ARTHROSCOPY Right 06/08/2016   Procedure: ARTHROSCOPY KNEE- partial medial minisectomy and condroplasty;  Surgeon: Melrose Nakayama, MD;  Location: [facility];  Service: Orthopedics;  Laterality: Right;  . ROTATOR CUFF REPAIR  2006   Margaretmary Eddy   . TONSILLECTOMY    . TUBAL LIGATION      Prior to Admission medications   Medication Sig Start Date End Date Taking? Authorizing Provider  acyclovir (ZOVIRAX) 400 MG tablet Take 1 tablet (400 mg total) by mouth daily. 08/25/16  Yes [name], MD  albuterol (PROAIR HFA) 108 (90 Base) MCG/ACT inhaler Inhale 2 puffs into the lungs every 6 (six) hours as needed for wheezing or shortness of breath. 10/14/16   Burnard Hawthorne, FNP  ALPRAZolam Duanne Moron) 0.5 MG tablet Take 1 tablet (0.5 mg total) by mouth at bedtime as needed for anxiety. 08/25/16   Deborra Medina  L, MD  aspirin EC 81 MG tablet Take 81 mg by mouth every other day.     [provider]  esomeprazole (NEXIUM) 20 MG capsule Take 20 mg by mouth daily as needed (For heartburn or acid reflux.).    [provider]    Allergies Influenza vaccines; Codeine; and Mobic [meloxicam]  Family History  Problem Relation Age of Onset  . Stroke Mother   . Alzheimer's disease Mother   . Hypertension Mother   . Colon polyps Maternal Aunt   . Cancer Maternal Aunt        colon  . Breast cancer Neg Hx     Social History Social History    Substance Use Topics  . Smoking status: Never Smoker  . Smokeless tobacco: Never Used  . Alcohol use No    Review of Systems Constitutional: No fever/chills Eyes: No visual changes. ENT: No sore throat. Cardiovascular: +chest pain Which awoke her from sleep at 5 AM after an episode of chest pain yesterday Respiratory: Denies shortness of breath. Gastrointestinal: No abdominal pain.  No nausea, no vomiting.  No diarrhea.  No constipation. Genitourinary: Negative for dysuria. Musculoskeletal: Negative for neck pain.  Negative for back pain. Integumentary: Negative for rash. Neurological: Negative for headaches, no focal weakness.  Some tingling and left arm associated with the chest pain   ____________________________________________   PHYSICAL EXAM:  VITAL SIGNS: ED Triage Vitals  Enc Vitals Group     BP 07/18/17 1216 (!) 147/73     Pulse Rate 07/18/17 1216 (!) 54     Resp 07/18/17 1216 20     Temp 07/18/17 1216 98.3 F (36.8 C)     Temp Source 07/18/17 1216 Oral     SpO2 07/18/17 1216 99 %     Weight 07/18/17 1217 83.5 kg (184 lb)     Height 07/18/17 1217 1.6 m (5\' 3" )     Head Circumference --      Peak Flow --      Pain Score --      Pain Loc --      Pain Edu? --      Excl. in [place]? --     Constitutional: Alert and oriented. Well appearing and in no acute distress. Eyes: Conjunctivae are normal.  Head: Atraumatic. Nose: No congestion/rhinnorhea. Mouth/Throat: Mucous membranes are moist. Neck: No stridor.  No meningeal signs.   Cardiovascular: Bradycardia, regular rhythm. Good peripheral circulation. Grossly normal heart sounds. Respiratory: Normal respiratory effort.  No retractions. Lungs CTAB. Gastrointestinal: Soft and nontender. No distention.  Musculoskeletal: No lower extremity tenderness nor edema. No gross deformities of extremities. Neurologic:  Normal speech and language. No gross focal neurologic deficits are appreciated.  Skin:  Skin is warm, dry  and intact. No rash noted. Psychiatric: Mood and affect are normal. Speech and behavior are normal.  ____________________________________________   LABS (all labs ordered are listed, but only abnormal results are displayed)  Labs Reviewed  BASIC METABOLIC PANEL - Abnormal; Notable for the following:       Result Value   Glucose, Bld 104 (*)    GFR calc non Af Amer 58 (*)    All other components within normal limits  CBC - Abnormal; Notable for the following:    RDW 16.1 (*)    All other components within normal limits  TROPONIN I  TROPONIN I  APTT  PROTIME-INR  CBC  BASIC METABOLIC PANEL  LIPID PANEL  TROPONIN I   ____________________________________________  EKG  ED ECG REPORT I, Alecia Doi, the attending physician, personally viewed and interpreted this ECG.  Date: 07/18/2017 EKG Time: 12:14 Rate: 52 Rhythm: sinus bradycardia QRS Axis: normal Intervals: normal ST/T Wave abnormalities: T-wave inversions in leads III and aVF, V1-V3, with flattening of T waves in V4-V6 Narrative Interpretation: possible acute ischemia, does not meet STEMI criteria but changed from last ECG on record  ____________________________________________  RADIOLOGY   Dg Chest 2 View  Result Date: 07/18/2017 CLINICAL DATA:  Pt reports chest pain since 0500 this morning. Pt reports taking aspirin but pain continues. Pt reports pain is centralized, radiates to back, some tingling to left arm EXAM: CHEST  2 VIEW COMPARISON:  07/20/2013,  chest x-ray and chest CT FINDINGS: Heart size is normal. Lungs are free of focal consolidations and pleural effusions. There is mild midthoracic spondylosis. IMPRESSION: No active cardiopulmonary disease. Electronically Signed   By: Nolon Nations M.D.   On: 07/18/2017 12:44    ____________________________________________   PROCEDURES  Critical Care performed: Yes, see critical care procedure note(s)   Procedure(s) performed:   .Critical  Care Performed by: Hinda Kehr Authorized by: Hinda Kehr   Critical care provider statement:    Critical care time (minutes):  30   Critical care time was exclusive of:  Separately billable procedures and treating other patients   Critical care was necessary to treat or prevent imminent or life-threatening deterioration of the following conditions:  Cardiac failure and circulatory failure   Critical care was time spent personally by me on the following activities:  Development of treatment plan with patient or surrogate, discussions with consultants, evaluation of patient's response to treatment, examination of patient, obtaining history from patient or surrogate, ordering and performing treatments and interventions, ordering and review of laboratory studies, ordering and review of radiographic studies, pulse oximetry, re-evaluation of patient's condition and review of old charts     ____________________________________________   INITIAL IMPRESSION / [place] / ED COURSE  Pertinent labs & imaging results that were available during my care of the patient were reviewed by me and considered in my medical decision making (see chart for details).  The patient meets criteria for unstable angina.  She had episodic chest pain that started yesterday and then awoke her from sleep, much more severe, occurring at rest this morning.  Although she has no identifiable risk factors for ACS, she is having chest pain that is radiating to her left arm and back occurring during her sleep.  She has some subtle EKG changes of her T waves compared to her last EKG.  Although she has a negative troponin I am concerned that this does represent unstable angina.  PE, aortic disease, or other acute or emergent condition is much less likely.  She is stable at this time but not completely asymptomatic.  I will give her another 2 baby aspirin (she had 2 at home) and give her a bolus and infusion of heparin.  I  discussed the case with the hospitalist who will admit for further investigation and management.  Stance and agrees with the plan and states that she is not comfortable going home and would rather stay in the hospital "to be sure".      ____________________________________________  FINAL CLINICAL IMPRESSION(S) / ED DIAGNOSES  Final diagnoses:  Unstable angina (HCC)     MEDICATIONS GIVEN DURING THIS VISIT:  Medications  aspirin chewable tablet 162 mg (not administered)  heparin bolus via infusion 4,000 Units (not administered)  heparin ADULT infusion 100 units/mL (25000 units/230mL sodium chloride 0.45%) (not administered)  nitroGLYCERIN (NITROSTAT) SL tablet 0.4 mg (not administered)  nitroGLYCERIN (NITROSTAT) SL tablet 0.4 mg (not administered)  ALPRAZolam (XANAX) tablet 0.5 mg (not administered)  albuterol (PROVENTIL HFA;VENTOLIN HFA) 108 (90 Base) MCG/ACT inhaler 2 puff (not administered)  aspirin EC tablet 81 mg (not administered)  pantoprazole (PROTONIX) EC tablet 40 mg (not administered)  acyclovir (ZOVIRAX) tablet 400 mg (not administered)     NEW OUTPATIENT MEDICATIONS STARTED DURING THIS VISIT:  New Prescriptions   No medications on file    Modified Medications   No medications on file    Discontinued Medications   AMOXICILLIN (AMOXIL) 500 MG CAPSULE    Take 1 capsule (500 mg total) by mouth 2 (two) times daily.   DICLOFENAC (VOLTAREN) 75 MG EC TABLET    Take 1 tablet (75 mg total) by mouth 2 (two) times daily.   FLUCONAZOLE (DIFLUCAN) 150 MG TABLET    Take 1 tablet (150 mg total) by mouth daily.     Note:  This document was prepared using Dragon voice recognition software and may include unintentional dictation errors.    Hinda Kehr, MD 07/18/17 Pollie Meyer    Hinda Kehr, MD 08/04/17 [phone]

## 2017-07-18 NOTE — ED Triage Notes (Signed)
Pt reports chest pain since 0500 this morning. Pt reports taking 2 81mg  aspirin but pain continues. Pt reports pain is centralized, radiates to back, some tingling to left arm. Also reports taking tylenol at 1000.

## 2017-07-18 NOTE — Progress Notes (Signed)
ANTICOAGULATION CONSULT NOTE - Initial Consult  Pharmacy Consult for heparin drip Indication: chest pain/ACS/unstable angina  Allergies  Allergen Reactions  . Influenza Vaccines Swelling and Other (See Comments)    Redness, Fever  . Codeine Nausea Only  . Mobic [Meloxicam] Itching and Rash   Patient Measurements: Height: 5\' 3"  (160 cm) Weight: 184 lb (83.5 kg) IBW/kg (Calculated) : 52.4 Heparin Dosing Weight: 71 kg  Vital Signs: Temp: 98.3 F (36.8 C) (07/23 1216) Temp Source: Oral (07/23 1216) BP: 138/70 (07/23 1600) Pulse Rate: 50 (07/23 1600)  Labs:  Recent Labs  07/18/17 1215  HGB 13.4  HCT 40.6  PLT 239  CREATININE 0.98  TROPONINI <0.03    Estimated Creatinine Clearance: 57 mL/min (by C-G formula based on SCr of 0.98 mg/dL).  Medical History: Past Medical History:  Diagnosis Date  . Allergic rhinitis   . Arthritis   . Asthma   . Family history of adverse reaction to anesthesia    PONV  . GERD (gastroesophageal reflux disease)   . Herpes simplex   . History of colon polyps   . IBS (irritable bowel syndrome)   . PONV (postoperative nausea and vomiting)   . Thyroid disease    graves disease   Assessment: Pharmacy consulted to dose and monitor heparin in this 66 year old female for ACS/unstable angina. Patient was not taking anticoagulants prior to admission per med rec. Baseline labs have been ordered.  Goal of Therapy:  Heparin level 0.3-0.7 units/ml Monitor platelets by anticoagulation protocol: Yes   Plan:  Give 4000 units bolus x 1 Start heparin infusion at 850 units/hr Check anti-Xa level in 6 hours and daily while on heparin Continue to monitor H&H and platelets  Lenis Noon, PharmD Clinical Pharmacist 07/18/2017,6:19 PM

## 2017-07-18 NOTE — ED Notes (Signed)
Admitting MD at bedside, pt's family at bedside at this time. NAD noted. Will continue to monitor for further patient needs.

## 2017-07-18 NOTE — Telephone Encounter (Signed)
Pt had some chest/back pain with tingling down her arm. This happened when she woke this morning.  *pt transferred to Nurse line

## 2017-07-18 NOTE — H&P (Signed)
[facility] [place] NAME: Katelyn Friedman    MR#:  [id number]  DATE OF BIRTH:  1951-10-04  DATE OF ADMISSION:  07/18/2017  PRIMARY CARE PHYSICIAN: [name], MD   REQUESTING/REFERRING PHYSICIAN: Dr Hinda Kehr  CHIEF COMPLAINT:   Chief Complaint  Patient presents with  . Chest Pain    HISTORY OF PRESENT ILLNESS:  Katelyn Friedman  is a 66 y.o. female woke up this morning with chest discomfort. She laid there for a little while and it got worse so she got up and laid in her recliner. She describes it as a heaviness pressure 6-8 out of 10 in intensity. It radiated from the front of her chest to the back of her chest. She also had some tingling in her left arm. Lasted a few hours. She felt tired all day. Now has some tingling in her chest. With this episode this morning she was sweating. No nausea. Yesterday she did have some shortness of breath. Hospitalist services were contacted for further evaluation  PAST MEDICAL HISTORY:   Past Medical History:  Diagnosis Date  . Allergic rhinitis   . Arthritis   . Asthma   . Family history of adverse reaction to anesthesia    PONV  . GERD (gastroesophageal reflux disease)   . Herpes simplex   . History of colon polyps   . IBS (irritable bowel syndrome)   . PONV (postoperative nausea and vomiting)   . Thyroid disease    graves disease    PAST SURGICAL HISTORY:   Past Surgical History:  Procedure Laterality Date  . BREAST BIOPSY Right 2014   negative  . COLONOSCOPY  2006  . KNEE ARTHROSCOPY  1992  . KNEE ARTHROSCOPY Right 06/08/2016   Procedure: ARTHROSCOPY KNEE- partial medial minisectomy and condroplasty;  Surgeon: Melrose Nakayama, MD;  Location: [facility];  Service: Orthopedics;  Laterality: Right;  . ROTATOR CUFF REPAIR  2006   Margaretmary Eddy   . TONSILLECTOMY    . TUBAL LIGATION      SOCIAL HISTORY:   Social History  Substance Use Topics  . Smoking status: Never  Smoker  . Smokeless tobacco: Never Used  . Alcohol use No    FAMILY HISTORY:   Family History  Problem Relation Age of Onset  . Stroke Mother   . Alzheimer's disease Mother   . Hypertension Mother   . Colon polyps Maternal Aunt   . Cancer Maternal Aunt        colon  . Breast cancer Neg Hx     DRUG ALLERGIES:   Allergies  Allergen Reactions  . Influenza Vaccines Swelling and Other (See Comments)    Redness, Fever  . Codeine Nausea Only  . Mobic [Meloxicam] Itching and Rash    REVIEW OF SYSTEMS:  CONSTITUTIONAL: No fever. Felt fatigued. Positive for sweating EYES: No blurred or double vision. Wears glasses EARS, NOSE, AND THROAT: No tinnitus or ear pain. No sore throat RESPIRATORY: No cough. Yesterday had shortness of breath. No wheezing or hemoptysis.  CARDIOVASCULAR: Positive for chest pain. No orthopnea, edema. Slight reproducible chest pain to palpation left chest wall GASTROINTESTINAL: No nausea, vomiting, diarrhea or abdominal pain. No blood in bowel movements GENITOURINARY: No dysuria, hematuria.  ENDOCRINE: No polyuria, nocturia. History of thyroid issues in the past HEMATOLOGY: No anemia, easy bruising or bleeding SKIN: No rash or lesion. MUSCULOSKELETAL: Positive for joint pain   NEUROLOGIC: No tingling, numbness, weakness.  PSYCHIATRY: No anxiety  or depression.   MEDICATIONS AT HOME:   Prior to Admission medications   Medication Sig Start Date End Date Taking? Authorizing Provider  acyclovir (ZOVIRAX) 400 MG tablet Take 1 tablet (400 mg total) by mouth daily. 08/25/16  Yes [name], MD  albuterol (PROAIR HFA) 108 (90 Base) MCG/ACT inhaler Inhale 2 puffs into the lungs every 6 (six) hours as needed for wheezing or shortness of breath. 10/14/16   Burnard Hawthorne, FNP  ALPRAZolam Duanne Moron) 0.5 MG tablet Take 1 tablet (0.5 mg total) by mouth at bedtime as needed for anxiety. 08/25/16   [name], MD  aspirin EC 81 MG tablet Take 81 mg by mouth every  other day.     [provider]  esomeprazole (NEXIUM) 20 MG capsule Take 20 mg by mouth daily as needed (For heartburn or acid reflux.).    [provider]      VITAL SIGNS:  Blood pressure 138/70, pulse (!) 50, temperature 98.3 F (36.8 C), temperature source Oral, resp. rate (!) 7, height 5\' 3"  (1.6 m), weight 83.5 kg (184 lb), SpO2 100 %.  PHYSICAL EXAMINATION:  GENERAL:  66 y.o.-year-old patient lying in the bed with no acute distress.  EYES: Pupils equal, round, reactive to light and accommodation. No scleral icterus. Extraocular muscles intact.  HEENT: Head atraumatic, normocephalic. Oropharynx and nasopharynx clear.  NECK:  Supple, no jugular venous distention. No thyroid enlargement, no tenderness.  LUNGS: Normal breath sounds bilaterally, no wheezing, rales,rhonchi or crepitation. No use of accessory muscles of respiration.  CARDIOVASCULAR: S1, S2 Bradycardic. No murmurs, rubs, or gallops.  ABDOMEN: Soft, nontender, nondistended. Bowel sounds present. No organomegaly or mass.  EXTREMITIES: No pedal edema, cyanosis, or clubbing.  NEUROLOGIC: Cranial nerves II through XII are intact. Muscle strength 5/5 in all extremities. Sensation intact. Gait not checked.  PSYCHIATRIC: The patient is alert and oriented x 3.  SKIN: No rash, lesion, or ulcer.   LABORATORY PANEL:   CBC  Recent Labs Lab 07/18/17 1215  WBC 8.6  HGB 13.4  HCT 40.6  PLT 239   ------------------------------------------------------------------------------------------------------------------  Chemistries   Recent Labs Lab 07/18/17 1215  NA 140  K 3.9  CL 106  CO2 26  GLUCOSE 104*  BUN 16  CREATININE 0.98  CALCIUM 9.1   ------------------------------------------------------------------------------------------------------------------  Cardiac Enzymes  Recent Labs Lab 07/18/17 1215  TROPONINI <0.03    ------------------------------------------------------------------------------------------------------------------  RADIOLOGY:  Dg Chest 2 View  Result Date: 07/18/2017 CLINICAL DATA:  Pt reports chest pain since 0500 this morning. Pt reports taking aspirin but pain continues. Pt reports pain is centralized, radiates to back, some tingling to left arm EXAM: CHEST  2 VIEW COMPARISON:  07/20/2013,  chest x-ray and chest CT FINDINGS: Heart size is normal. Lungs are free of focal consolidations and pleural effusions. There is mild midthoracic spondylosis. IMPRESSION: No active cardiopulmonary disease. Electronically Signed   By: Nolon Nations M.D.   On: 07/18/2017 12:44    EKG:   EKG sinus rhythm. Flipped T waves V1 through V3. Flattening of T waves in V4 through V6.  IMPRESSION AND PLAN:   1. Chest pain concerning for unstable angina. Monitor overnight on telemetry. Serial cardiac enzymes. Given aspirin in the emergency room and I will continue that daily. Heparin drip ordered by ER physician. Beta blocker contraindicated with bradycardia. I will try nitroglycerin. If cardiac enzymes remain negative we'll get a stress test tomorrow. Case discussed with Dr. Ubaldo Glassing cardiology (patient has seen Dr. Clayborn Bigness in  the past). 2. GERD on PPI 3. History of anxiety on Xanax 4. History of herpes simplex on acyclovir prophylactically   All the records are reviewed and case discussed with ED provider. Management plans discussed with the patient, family and they are in agreement.  CODE STATUS: Full code  TOTAL TIME TAKING CARE OF THIS PATIENT: 50 minutes.    Loletha Grayer M.D on 07/18/2017 at 6:37 PM  Between 7am to 6pm - Pager - [phone]  After 6pm call admission pager [phone]  [facility]  [phone]  CC: Primary care physician; [name], MD

## 2017-07-18 NOTE — Telephone Encounter (Signed)
Spoke with the patient, she hs having car trouble and had to get her car towed.  Waiting for her husband to return and has promised to go the ED.  No active chest pain at this time, still has the tingling in her left arm and I advised that she needs to be seen.  She agreed and told me as soon as her husband returned she would go to the ED. I told her we would follow up after ED visit.

## 2017-07-18 NOTE — Telephone Encounter (Signed)
Attempted to follow up with patient, unable to reach via phone, will wait to see if she turn up in ED. Thanks

## 2017-07-18 NOTE — Telephone Encounter (Signed)
fyi

## 2017-07-18 NOTE — ED Notes (Signed)
This RN to bedside, apologized for delay. Pt is noted to be alert and oriented at this time. Pt's family at bedside. Pt denies any needs. Will continue to monitor for further patient needs.

## 2017-07-18 NOTE — Plan of Care (Signed)
Problem: Cardiac: Goal: Ability to achieve and maintain adequate cardiovascular perfusion will improve Outcome: Progressing Pain free since arrival to floor. Sb/SR 52-62 noted on telemetry.

## 2017-07-19 ENCOUNTER — Encounter: Payer: Self-pay | Admitting: Radiology

## 2017-07-19 ENCOUNTER — Observation Stay: Payer: PPO

## 2017-07-19 DIAGNOSIS — R079 Chest pain, unspecified: Secondary | ICD-10-CM | POA: Diagnosis not present

## 2017-07-19 DIAGNOSIS — F419 Anxiety disorder, unspecified: Secondary | ICD-10-CM | POA: Diagnosis not present

## 2017-07-19 DIAGNOSIS — K219 Gastro-esophageal reflux disease without esophagitis: Secondary | ICD-10-CM | POA: Diagnosis not present

## 2017-07-19 LAB — BASIC METABOLIC PANEL
Anion gap: 6 (ref 5–15)
BUN: 14 mg/dL (ref 6–20)
CO2: 26 mmol/L (ref 22–32)
Calcium: 8.6 mg/dL — ABNORMAL LOW (ref 8.9–10.3)
Chloride: 110 mmol/L (ref 101–111)
Creatinine, Ser: 0.94 mg/dL (ref 0.44–1.00)
GFR calc Af Amer: >60 mL/min (ref >60)
GFR calc non Af Amer: >60 mL/min (ref >60)
Glucose, Bld: 100 mg/dL — ABNORMAL HIGH (ref 65–99)
Potassium: 3.5 mmol/L (ref 3.5–5.1)
Sodium: 142 mmol/L (ref 135–145)

## 2017-07-19 LAB — NM MYOCAR MULTI W/SPECT W/WALL MOTION / EF
Estimated workload: 5.7 METS
Exercise duration (min): 3 min
Exercise duration (sec): 59 s
LV dias vol: 66 mL (ref 46–106)
LV sys vol: 19 mL
Peak HR: 131 {beats}/min
Rest HR: 63 {beats}/min
SDS: 4
SRS: 0
SSS: 1
TID: 0.84

## 2017-07-19 LAB — LIPID PANEL
Cholesterol: 199 mg/dL (ref 0–200)
HDL: 60 mg/dL (ref >40)
LDL Cholesterol: 115 mg/dL — ABNORMAL HIGH (ref 0–99)
Total CHOL/HDL Ratio: 3.3 RATIO
Triglycerides: 122 mg/dL (ref <150)
VLDL: 24 mg/dL (ref 0–40)

## 2017-07-19 LAB — HEPARIN LEVEL (UNFRACTIONATED)
Heparin Unfractionated: 0.71 IU/mL — ABNORMAL HIGH (ref 0.30–0.70)
Heparin Unfractionated: 0.68 IU/mL (ref 0.30–0.70)
Heparin Unfractionated: 0.67 IU/mL (ref 0.30–0.70)

## 2017-07-19 LAB — CBC
HCT: 36.4 % (ref 35.0–47.0)
Hemoglobin: 12.3 g/dL (ref 12.0–16.0)
MCH: 29.1 pg (ref 26.0–34.0)
MCHC: 33.9 g/dL (ref 32.0–36.0)
MCV: 85.7 fL (ref 80.0–100.0)
Platelets: 234 10*3/uL (ref 150–440)
RBC: 4.24 MIL/uL (ref 3.80–5.20)
RDW: 16.5 % — ABNORMAL HIGH (ref 11.5–14.5)
WBC: 9.3 10*3/uL (ref 3.6–11.0)

## 2017-07-19 LAB — APTT: aPTT: >160 seconds (ref 24–36)

## 2017-07-19 LAB — PROTIME-INR
INR: 1.11
Prothrombin Time: 14.4 seconds (ref 11.4–15.2)

## 2017-07-19 MED ORDER — ACETAMINOPHEN 325 MG PO TABS
650.0000 mg | ORAL_TABLET | Freq: Four times a day (QID) | ORAL | Status: DC | PRN
  Administered 2017-07-19: 650 mg via ORAL
  Filled 2017-07-19: qty 2

## 2017-07-19 MED ORDER — TECHNETIUM TC 99M TETROFOSMIN IV KIT
30.0000 | PACK | Freq: Once | INTRAVENOUS | Status: AC | PRN
  Administered 2017-07-19: 29.828 via INTRAVENOUS

## 2017-07-19 MED ORDER — TECHNETIUM TC 99M TETROFOSMIN IV KIT
12.5310 | PACK | Freq: Once | INTRAVENOUS | Status: AC | PRN
  Administered 2017-07-19: 12.531 via INTRAVENOUS

## 2017-07-19 NOTE — Care Management Obs Status (Signed)
[facility] NOTIFICATION   Patient Details  Name: EVIANA SIBILIA MRN: [id number] Date of Birth: 1951/02/27   Medicare Observation Status Notification Given:  Yes    Katrina Stack, RN 07/19/2017, 12:18 PM

## 2017-07-19 NOTE — Progress Notes (Signed)
ANTICOAGULATION CONSULT NOTE - Initial Consult  Pharmacy Consult for heparin drip Indication: chest pain/ACS/unstable angina  Allergies  Allergen Reactions  . Influenza Vaccines Swelling and Other (See Comments)    Redness, Fever  . Codeine Nausea Only  . Mobic [Meloxicam] Itching and Rash   Patient Measurements: Height: 5\' 3"  (160 cm) Weight: 178 lb 14.4 oz (81.1 kg) IBW/kg (Calculated) : 52.4 Heparin Dosing Weight: 71 kg  Vital Signs: Temp: 99.5 F (37.5 C) (07/23 2050) Temp Source: Oral (07/23 2050) BP: 148/65 (07/23 2050) Pulse Rate: 54 (07/23 2050)  Labs:  Recent Labs  07/18/17 1215 07/18/17 1840 07/18/17 2203 07/19/17 0058  HGB 13.4  --   --  12.3  HCT 40.6  --   --  36.4  PLT 239  --   --  234  APTT  --   --  >160*  --   LABPROT  --   --  14.4  --   INR  --   --  1.11  --   HEPARINUNFRC  --   --   --  0.71*  CREATININE 0.98  --   --  0.94  TROPONINI <0.03 <0.03 <0.03  --     Estimated Creatinine Clearance: 58.6 mL/min (by C-G formula based on SCr of 0.94 mg/dL).  Medical History: Past Medical History:  Diagnosis Date  . Allergic rhinitis   . Arthritis   . Asthma   . Family history of adverse reaction to anesthesia    PONV  . GERD (gastroesophageal reflux disease)   . Herpes simplex   . History of colon polyps   . IBS (irritable bowel syndrome)   . PONV (postoperative nausea and vomiting)   . Thyroid disease    graves disease   Assessment: Pharmacy consulted to dose and monitor heparin in this 66 year old female for ACS/unstable angina. Patient was not taking anticoagulants prior to admission per med rec. Baseline labs have been ordered.  Goal of Therapy:  Heparin level 0.3-0.7 units/ml Monitor platelets by anticoagulation protocol: Yes   Plan:  Give 4000 units bolus x 1 Start heparin infusion at 850 units/hr Check anti-Xa level in 6 hours and daily while on heparin Continue to monitor H&H and platelets   7/24 0200 heparin level 0.71.  Decrease rate to 800 units/hr and recheck in 6 hours.  Eloise Harman, PharmD Clinical Pharmacist 07/19/2017,2:13 AM

## 2017-07-19 NOTE — Progress Notes (Signed)
CRITICAL VALUE ALERT  Critical Value:  PTT >160  Date & Time Notified: 0208 07/19/2017  Provider Notified:Matt, Pharmacist  Orders Received/Actions taken: Pharmacy to adjust Heparin rate.

## 2017-07-19 NOTE — Discharge Summary (Signed)
[facility] [place] NAME: Katelyn Friedman    MR#:  [id number]  DATE OF BIRTH:  [birth year]  DATE OF ADMISSION:  07/18/2017 ADMITTING PHYSICIAN: Loletha Grayer, MD  DATE OF DISCHARGE: 07/19/2017  PRIMARY CARE PHYSICIAN: [name], MD    ADMISSION DIAGNOSIS:  Unstable angina ([place]) [I20.0] Chest pain [R07.9]  DISCHARGE DIAGNOSIS:  Active Problems:   Chest pain    Negative stress test.  SECONDARY DIAGNOSIS:   Past Medical History:  Diagnosis Date  . Allergic rhinitis   . Arthritis   . Asthma   . Family history of adverse reaction to anesthesia    PONV  . GERD (gastroesophageal reflux disease)   . Herpes simplex   . History of colon polyps   . IBS (irritable bowel syndrome)   . PONV (postoperative nausea and vomiting)   . Thyroid disease    graves disease    HOSPITAL COURSE:   Patient came with chest pain complain, seen by cardiologist and suggested to have a stress test done. Stress test came negative and so cardiology suggested to discharge with follow-up with cardiologist in the office.  DISCHARGE CONDITIONS:   Stable.  CONSULTS OBTAINED:  Treatment Team:  Teodoro Spray, MD  DRUG ALLERGIES:   Allergies  Allergen Reactions  . Influenza Vaccines Swelling and Other (See Comments)    Redness, Fever  . Codeine Nausea Only  . Mobic [Meloxicam] Itching and Rash    DISCHARGE MEDICATIONS:   Current Discharge Medication List    CONTINUE these medications which have NOT CHANGED   Details  acyclovir (ZOVIRAX) 400 MG tablet Take 1 tablet (400 mg total) by mouth daily. Qty: 90 tablet, Refills: 3    albuterol (PROAIR HFA) 108 (90 Base) MCG/ACT inhaler Inhale 2 puffs into the lungs every 6 (six) hours as needed for wheezing or shortness of breath. Qty: 1 Inhaler, Refills: 1   Associated Diagnoses: Cough    ALPRAZolam (XANAX) 0.5 MG tablet Take 1 tablet (0.5 mg total) by mouth at bedtime as needed for  anxiety. Qty: 30 tablet, Refills: 3    aspirin EC 81 MG tablet Take 81 mg by mouth every other day.     esomeprazole (NEXIUM) 20 MG capsule Take 20 mg by mouth daily as needed (For heartburn or acid reflux.).         DISCHARGE INSTRUCTIONS:    Follow with Dr. Clayborn Bigness in 2 weeks.  If you experience worsening of your admission symptoms, develop shortness of breath, life threatening emergency, suicidal or homicidal thoughts you must seek medical attention immediately by calling 911 or calling your MD immediately  if symptoms less severe.  You Must read complete instructions/literature along with all the possible adverse reactions/side effects for all the Medicines you take and that have been prescribed to you. Take any new Medicines after you have completely understood and accept all the possible adverse reactions/side effects.   Please note  You were cared for by a hospitalist during your hospital stay. If you have any questions about your discharge medications or the care you received while you were in the hospital after you are discharged, you can call the unit and asked to speak with the hospitalist on call if the hospitalist that took care of you is not available. Once you are discharged, your primary care physician will handle any further medical issues. Please note that NO REFILLS for any discharge medications will be authorized once you are discharged,  as it is imperative that you return to your primary care physician (or establish a relationship with a primary care physician if you do not have one) for your aftercare needs so that they can reassess your need for medications and monitor your lab values.    Today   CHIEF COMPLAINT:   Chief Complaint  Patient presents with  . Chest Pain    HISTORY OF PRESENT ILLNESS:  Katelyn Friedman  is a 66 y.o. female woke up this morning with chest discomfort. She laid there for a little while and it got worse so she got up and laid in her  recliner. She describes it as a heaviness pressure 6-8 out of 10 in intensity. It radiated from the front of her chest to the back of her chest. She also had some tingling in her left arm. Lasted a few hours. She felt tired all day. Now has some tingling in her chest. With this episode this morning she was sweating. No nausea. Yesterday she did have some shortness of breath. Hospitalist services were contacted for further evaluation   VITAL SIGNS:  Blood pressure 121/63, pulse (!) 53, temperature 98 F (36.7 C), temperature source Oral, resp. rate 16, height 5\' 3"  (1.6 m), weight 81.1 kg (178 lb 14.4 oz), SpO2 99 %.  I/O:   Intake/Output Summary (Last 24 hours) at 07/19/17 1547 Last data filed at 07/19/17 1359  Gross per 24 hour  Intake            91.42 ml  Output              300 ml  Net          -208.58 ml    PHYSICAL EXAMINATION:  GENERAL:  66 y.o.-year-old patient lying in the bed with no acute distress.  EYES: Pupils equal, round, reactive to light and accommodation. No scleral icterus. Extraocular muscles intact.  HEENT: Head atraumatic, normocephalic. Oropharynx and nasopharynx clear.  NECK:  Supple, no jugular venous distention. No thyroid enlargement, no tenderness.  LUNGS: Normal breath sounds bilaterally, no wheezing, rales,rhonchi or crepitation. No use of accessory muscles of respiration.  CARDIOVASCULAR: S1, S2 normal. No murmurs, rubs, or gallops.  ABDOMEN: Soft, non-tender, non-distended. Bowel sounds present. No organomegaly or mass.  EXTREMITIES: No pedal edema, cyanosis, or clubbing.  NEUROLOGIC: Cranial nerves II through XII are intact. Muscle strength 5/5 in all extremities. Sensation intact. Gait not checked.  PSYCHIATRIC: The patient is alert and oriented x 3.  SKIN: No obvious rash, lesion, or ulcer.   DATA REVIEW:   CBC  Recent Labs Lab 07/19/17 0058  WBC 9.3  HGB 12.3  HCT 36.4  PLT 234    Chemistries   Recent Labs Lab 07/19/17 0058  NA 142   K 3.5  CL 110  CO2 26  GLUCOSE 100*  BUN 14  CREATININE 0.94  CALCIUM 8.6*    Cardiac Enzymes  Recent Labs Lab 07/18/17 2203  TROPONINI <0.03    Microbiology Results  Results for orders placed or performed in visit on 05/12/16  WET PREP BY MOLECULAR PROBE     Status: Abnormal   Collection Time: 05/12/16  3:59 PM  Result Value Ref Range Status   Candida species POS (A) Negative Final   Trichomonas vaginosis NEG Negative Final   Gardnerella vaginalis POS (A) Negative Final    RADIOLOGY:  Dg Chest 2 View  Result Date: 07/18/2017 CLINICAL DATA:  Pt reports chest pain since 0500 this morning. Pt reports  taking aspirin but pain continues. Pt reports pain is centralized, radiates to back, some tingling to left arm EXAM: CHEST  2 VIEW COMPARISON:  07/20/2013,  chest x-ray and chest CT FINDINGS: Heart size is normal. Lungs are free of focal consolidations and pleural effusions. There is mild midthoracic spondylosis. IMPRESSION: No active cardiopulmonary disease. Electronically Signed   By: Nolon Nations M.D.   On: 07/18/2017 12:44   Nm Myocar Multi W/spect W/wall Motion / Ef  Result Date: 07/19/2017  The study is normal.  This is a low risk study.  The left ventricular ejection fraction is normal (55-65%).  There was no ST segment deviation noted during stress.  No T wave inversion was noted during stress.  Low risk study. LV function normal No evidence of ischemia    EKG:   Orders placed or performed during the hospital encounter of 07/18/17  . EKG 12-Lead  . EKG 12-Lead  . ED EKG within 10 minutes  . ED EKG within 10 minutes  . EKG      Management plans discussed with the patient, family and they are in agreement.  CODE STATUS:     Code Status Orders        Start     Ordered   07/18/17 1829  Full code  Continuous     07/18/17 1829    Code Status History    Date Active Date Inactive Code Status Order ID Comments User Context   This patient has a  current code status but no historical code status.      TOTAL TIME TAKING CARE OF THIS PATIENT: 35 minutes.    Vaughan Basta M.D on 07/19/2017 at 3:47 PM  Between 7am to 6pm - Pager - [phone]  After 6pm go to [email or web address] - password [email or web address] [facility]  Office  [phone]  CC: Primary care physician; [name], MD   Note: This dictation was prepared with Dragon dictation along with smaller phrase technology. Any transcriptional errors that result from this process are unintentional.

## 2017-07-19 NOTE — Progress Notes (Signed)
IV and tele removed from patient. Discharge instructions given to patient. Patient verbalized understanding. No distress at this time. Family member at bedside and will be transporting patient home.

## 2017-07-19 NOTE — Progress Notes (Signed)
   07/19/17 0800  Clinical Encounter Type  Visited With Patient;Family;Patient and family together  Visit Type Initial;Other (Comment) (JCPOA)  Referral From Physician  Spiritual Encounters  Spiritual Needs Other (Comment);Literature;Prayer (HCPOA)   HCPOA/AD materials dropped off with patient.  [place] available to review as needed.  Prayer and spiritual support offered to patient and spouse at bedside.

## 2017-07-19 NOTE — Progress Notes (Signed)
ANTICOAGULATION CONSULT NOTE - Initial Consult  Pharmacy Consult for heparin drip Indication: chest pain/ACS/unstable angina  Allergies  Allergen Reactions  . Influenza Vaccines Swelling and Other (See Comments)    Redness, Fever  . Codeine Nausea Only  . Mobic [Meloxicam] Itching and Rash   Patient Measurements: Height: 5\' 3"  (160 cm) Weight: 178 lb 14.4 oz (81.1 kg) IBW/kg (Calculated) : 52.4 Heparin Dosing Weight: 71 kg  Vital Signs: Temp: 98 F (36.7 C) (07/24 0833) Temp Source: Oral (07/24 0833) BP: 121/63 (07/24 0833) Pulse Rate: 53 (07/24 0833)  Labs:  Recent Labs  07/18/17 1215 07/18/17 1840 07/18/17 2203 07/19/17 0058 07/19/17 0801 07/19/17 1418  HGB 13.4  --   --  12.3  --   --   HCT 40.6  --   --  36.4  --   --   PLT 239  --   --  234  --   --   APTT  --   --  >160*  --   --   --   LABPROT  --   --  14.4  --   --   --   INR  --   --  1.11  --   --   --   HEPARINUNFRC  --   --   --  0.71* 0.68 0.67  CREATININE 0.98  --   --  0.94  --   --   TROPONINI <0.03 <0.03 <0.03  --   --   --     Estimated Creatinine Clearance: 58.6 mL/min (by C-G formula based on SCr of 0.94 mg/dL).  Medical History: Past Medical History:  Diagnosis Date  . Allergic rhinitis   . Arthritis   . Asthma   . Family history of adverse reaction to anesthesia    PONV  . GERD (gastroesophageal reflux disease)   . Herpes simplex   . History of colon polyps   . IBS (irritable bowel syndrome)   . PONV (postoperative nausea and vomiting)   . Thyroid disease    graves disease   Assessment: Pharmacy consulted to dose and monitor heparin in this 66 year old female for ACS/unstable angina. Patient was not taking anticoagulants prior to admission per med rec. Baseline labs have been ordered.  Goal of Therapy:  Heparin level 0.3-0.7 units/ml Monitor platelets by anticoagulation protocol: Yes   Plan:  Give 4000 units bolus x 1 Start heparin infusion at 850 units/hr Check anti-Xa  level in 6 hours and daily while on heparin Continue to monitor H&H and platelets   7/24 0200 heparin level 0.71. Decrease rate to 800 units/hr and recheck in 6 hours. 7/24: Heparin level @ 0801 resulted @ 0.68. Will continue current rate. Will recheck Heparin level @ 1400.  7/24: Heparin level resulted @ 0.67. Will continue heparin gtt rate. Will recheck Heparin level in am .  Larene Beach, PharmD Clinical Pharmacist 07/19/2017,3:25 PM

## 2017-07-19 NOTE — Progress Notes (Signed)
ANTICOAGULATION CONSULT NOTE - Initial Consult  Pharmacy Consult for heparin drip Indication: chest pain/ACS/unstable angina  Allergies  Allergen Reactions  . Influenza Vaccines Swelling and Other (See Comments)    Redness, Fever  . Codeine Nausea Only  . Mobic [Meloxicam] Itching and Rash   Patient Measurements: Height: 5\' 3"  (160 cm) Weight: 178 lb 14.4 oz (81.1 kg) IBW/kg (Calculated) : 52.4 Heparin Dosing Weight: 71 kg  Vital Signs: Temp: 98 F (36.7 C) (07/24 0833) Temp Source: Oral (07/24 0833) BP: 121/63 (07/24 0833) Pulse Rate: 53 (07/24 0833)  Labs:  Recent Labs  07/18/17 1215 07/18/17 1840 07/18/17 2203 07/19/17 0058 07/19/17 0801  HGB 13.4  --   --  12.3  --   HCT 40.6  --   --  36.4  --   PLT 239  --   --  234  --   APTT  --   --  >160*  --   --   LABPROT  --   --  14.4  --   --   INR  --   --  1.11  --   --   HEPARINUNFRC  --   --   --  0.71* 0.68  CREATININE 0.98  --   --  0.94  --   TROPONINI <0.03 <0.03 <0.03  --   --     Estimated Creatinine Clearance: 58.6 mL/min (by C-G formula based on SCr of 0.94 mg/dL).  Medical History: Past Medical History:  Diagnosis Date  . Allergic rhinitis   . Arthritis   . Asthma   . Family history of adverse reaction to anesthesia    PONV  . GERD (gastroesophageal reflux disease)   . Herpes simplex   . History of colon polyps   . IBS (irritable bowel syndrome)   . PONV (postoperative nausea and vomiting)   . Thyroid disease    graves disease   Assessment: Pharmacy consulted to dose and monitor heparin in this 66 year old female for ACS/unstable angina. Patient was not taking anticoagulants prior to admission per med rec. Baseline labs have been ordered.  Goal of Therapy:  Heparin level 0.3-0.7 units/ml Monitor platelets by anticoagulation protocol: Yes   Plan:  Give 4000 units bolus x 1 Start heparin infusion at 850 units/hr Check anti-Xa level in 6 hours and daily while on heparin Continue to  monitor H&H and platelets   7/24 0200 heparin level 0.71. Decrease rate to 800 units/hr and recheck in 6 hours. 7/24: Heparin level @ 0801 resulted @ 0.68. Will continue current rate. Will recheck Heparin level @ 1400.  Larene Beach, PharmD Clinical Pharmacist 07/19/2017,11:05 AM

## 2017-07-19 NOTE — Consult Note (Signed)
Katelyn Friedman  CARDIOLOGY CONSULT NOTE  Patient ID: Katelyn Friedman MRN: [id number] DOB/AGE: 67-Aug-1952 67 y.o.  Admit date: 07/18/2017 Referring Physician Dr. Anselm Jungling Primary Physician Dr. Derrel Nip Primary Cardiologist Dr. Clayborn Bigness Reason for Consultation chest pain  HPI: Patient is a 66 year old female with no prior cardiac history, history of gastroesophageal reflux disease as well as history of some stress at home due to illness in her husband who presented to the emergency room with epigastric chest pain that lasted approximately 1-1-1/2 hours 24 hours prior to presenting to the emergency room. She awoke from sleep on the day prior to presentation with some epigastric pain. It caused her to get up from bed. It radiated through to her back and she had some tingling in her left arm. She had no shortness of breath. She has not had pain similar to this in the past. She has seen a cardiologist about 5 years ago and had a stress test which per her report was unremarkable. Records are currently not available. She called her primary physician office regarding the epigastric pain on Monday morning and was told emergency room. Electrocardiogram on presentation revealed sinus rhythm with anterolateral T-wave inversions. She had similar findings on previous electrocardiogram. She has ruled out for a myocardial infarction with normal serum 3. She had no further discomfort since the episode 24 hours prior to presentation. Risk factors include secondary tobacco smoke however denies hypertension, hyperlipidemia, family history. She denies any rapid or irregular heartbeat. She is quite active at home and has no symptoms with this.  Review of Systems  Constitutional: Negative.   HENT: Negative.   Eyes: Negative.   Respiratory: Negative.   Cardiovascular: Positive for chest pain.  Gastrointestinal: Positive for heartburn.  Genitourinary: Negative.     Musculoskeletal: Negative.   Skin: Negative.   Neurological: Negative.   Endo/Heme/Allergies: Negative.   Psychiatric/Behavioral: Negative.     Past Medical History:  Diagnosis Date  . Allergic rhinitis   . Arthritis   . Asthma   . Family history of adverse reaction to anesthesia    PONV  . GERD (gastroesophageal reflux disease)   . Herpes simplex   . History of colon polyps   . IBS (irritable bowel syndrome)   . PONV (postoperative nausea and vomiting)   . Thyroid disease    graves disease    Family History  Problem Relation Age of Onset  . Stroke Mother   . Alzheimer's disease Mother   . Hypertension Mother   . Colon polyps Maternal Aunt   . Cancer Maternal Aunt        colon  . Breast cancer Neg Hx     Social History   Social History  . Marital status: Married    Spouse name: N/A  . Number of children: N/A  . Years of education: N/A   Occupational History  . Not on file.   Social History Main Topics  . Smoking status: Never Smoker  . Smokeless tobacco: Never Used  . Alcohol use No  . Drug use: No  . Sexual activity: Yes    Partners: Male   Other Topics Concern  . Not on file   Social History Narrative   Married: 19 yrs   Regular exercise: walk   Caffeine use: none    Past Surgical History:  Procedure Laterality Date  . BREAST BIOPSY Right 2014   negative  . COLONOSCOPY  2006  . KNEE ARTHROSCOPY  1992  . KNEE ARTHROSCOPY Right 06/08/2016   Procedure: ARTHROSCOPY KNEE- partial medial minisectomy and condroplasty;  Surgeon: Melrose Nakayama, MD;  Location: [place];  Service: Orthopedics;  Laterality: Right;  . ROTATOR CUFF REPAIR  2006   Margaretmary Eddy   . TONSILLECTOMY    . TUBAL LIGATION       Prescriptions Prior to Admission  Medication Sig Dispense Refill Last Dose  . acyclovir (ZOVIRAX) 400 MG tablet Take 1 tablet (400 mg total) by mouth daily. 90 tablet 3 unknown at unknown  . albuterol (PROAIR HFA) 108 (90 Base) MCG/ACT inhaler Inhale 2  puffs into the lungs every 6 (six) hours as needed for wheezing or shortness of breath. 1 Inhaler 1   . ALPRAZolam (XANAX) 0.5 MG tablet Take 1 tablet (0.5 mg total) by mouth at bedtime as needed for anxiety. 30 tablet 3 prn at prn  . aspirin EC 81 MG tablet Take 81 mg by mouth every other day.    unknown at unknown  . esomeprazole (NEXIUM) 20 MG capsule Take 20 mg by mouth daily as needed (For heartburn or acid reflux.).   unknown at unknown    Physical Exam: Blood pressure 115/62, pulse (!) 57, temperature 98.7 F (37.1 C), temperature source Oral, resp. rate 17, height 5\' 3"  (1.6 m), weight 81.1 kg (178 lb 14.4 oz), SpO2 100 %.   Wt Readings from Last 1 Encounters:  07/18/17 81.1 kg (178 lb 14.4 oz)     General appearance: alert and cooperative Head: Normocephalic, without obvious abnormality, atraumatic Resp: clear to auscultation bilaterally Chest wall: no tenderness Cardio: regular rate and rhythm GI: soft, non-tender; bowel sounds normal; no masses,  no organomegaly Extremities: extremities normal, atraumatic, no cyanosis or edema Pulses: 2+ and symmetric Neurologic: Grossly normal  Labs:   Lab Results  Component Value Date   WBC 9.3 07/19/2017   HGB 12.3 07/19/2017   HCT 36.4 07/19/2017   MCV 85.7 07/19/2017   PLT 234 07/19/2017    Recent Labs Lab 07/19/17 0058  NA 142  K 3.5  CL 110  CO2 26  BUN 14  CREATININE 0.94  CALCIUM 8.6*  GLUCOSE 100*   Lab Results  Component Value Date   TROPONINI <0.03 07/18/2017      Radiology: No acute cardiopulmonary disease on chest x-ray. EKG: Sinus rhythm with anterolateral T-wave inversions unchanged from EKG done in 2014.  ASSESSMENT AND PLAN:  66 year old female with no prior cardiac history who had an episode of one and half hours of epigastric discomfort radiating through to her back and tingling in her left arm. This occurred approximately 24-36 hours prior to presenting to the emergency room. Workup in the ER is  been unremarkable. Electrocardiogram showed no ischemia. Cardiac markers showed no evidence of myocardial infarction. She has ruled out for myocardial infarction. Specifically typical features. We'll proceed with a functional study to evaluate for possible ischemic etiology of chest pain with further recommendations after this is completed. We'll continue heparin until functional study is read. We will continue to defer beta blocker due to relative bradycardia. We'll continue with enteric-coated aspirin. Signed: Teodoro Spray MD, [facility] 07/19/2017, 7:06 AM

## 2017-07-20 ENCOUNTER — Telehealth: Payer: Self-pay | Admitting: Internal Medicine

## 2017-07-20 NOTE — Telephone Encounter (Signed)
Transition Care Management Follow-up Telephone Call  How have you been since you were released from the hospital? Feels better no chest pain since discharge.   Do you understand why you were in the hospital? Yes   Do you understand the discharge instrcutions? yes  Items Reviewed:  Medications reviewed: yes  Allergies reviewed: yes  Dietary changes reviewed: yes  Referrals reviewed: yes   Functional Questionnaire:   Activities of Daily Living (ADLs):   She states they are independent in the following: Independent in all ADLs States they require assistance with the following: No assist needed.   Any transportation issues/concerns?: No   Any patient concerns? No   Confirmed importance and date/time of follow-up visits scheduled: Yes   Confirmed with patient if condition begins to worsen call PCP or go to the ER.  Patient was given the Call-a-Nurse line [phone]: Yes

## 2017-07-20 NOTE — Telephone Encounter (Signed)
FYI, HFU, Pt was discharged on yesterday. Dx was Chest pains. No appt avail to sch. Let me know where to sch pt. Pt needs a appt within 2 weeks.  Thank you!  Call pt @ [phone].

## 2017-07-25 ENCOUNTER — Encounter: Payer: Self-pay | Admitting: Internal Medicine

## 2017-07-25 ENCOUNTER — Ambulatory Visit (INDEPENDENT_AMBULATORY_CARE_PROVIDER_SITE_OTHER): Payer: PPO | Admitting: Internal Medicine

## 2017-07-25 VITALS — BP 120/70 | HR 64 | Temp 98.1°F | Resp 15 | Ht 63.0 in | Wt 181.6 lb

## 2017-07-25 DIAGNOSIS — E559 Vitamin D deficiency, unspecified: Secondary | ICD-10-CM

## 2017-07-25 DIAGNOSIS — F411 Generalized anxiety disorder: Secondary | ICD-10-CM

## 2017-07-25 DIAGNOSIS — R0789 Other chest pain: Secondary | ICD-10-CM | POA: Diagnosis not present

## 2017-07-25 DIAGNOSIS — E0500 Thyrotoxicosis with diffuse goiter without thyrotoxic crisis or storm: Secondary | ICD-10-CM | POA: Diagnosis not present

## 2017-07-25 DIAGNOSIS — F5105 Insomnia due to other mental disorder: Secondary | ICD-10-CM | POA: Diagnosis not present

## 2017-07-25 DIAGNOSIS — F419 Anxiety disorder, unspecified: Secondary | ICD-10-CM | POA: Diagnosis not present

## 2017-07-25 DIAGNOSIS — R1011 Right upper quadrant pain: Secondary | ICD-10-CM

## 2017-07-25 DIAGNOSIS — Z09 Encounter for follow-up examination after completed treatment for conditions other than malignant neoplasm: Secondary | ICD-10-CM

## 2017-07-25 DIAGNOSIS — F418 Other specified anxiety disorders: Secondary | ICD-10-CM | POA: Insufficient documentation

## 2017-07-25 LAB — VITAMIN D 25 HYDROXY (VIT D DEFICIENCY, FRACTURES): VITD: 26.56 ng/mL — ABNORMAL LOW (ref 30.00–100.00)

## 2017-07-25 LAB — HEPATIC FUNCTION PANEL
ALT: 12 U/L (ref 0–35)
AST: 15 U/L (ref 0–37)
Albumin: 4.2 g/dL (ref 3.5–5.2)
Alkaline Phosphatase: 52 U/L (ref 39–117)
Bilirubin, Direct: 0.1 mg/dL (ref 0.0–0.3)
Total Bilirubin: 0.6 mg/dL (ref 0.2–1.2)
Total Protein: 7 g/dL (ref 6.0–8.3)

## 2017-07-25 LAB — TSH: TSH: 1.96 u[IU]/mL (ref 0.35–4.50)

## 2017-07-25 LAB — T4, FREE: Free T4: 0.7 ng/dL (ref 0.60–1.60)

## 2017-07-25 MED ORDER — ESCITALOPRAM OXALATE 10 MG PO TABS: 10.0000 mg | ORAL_TABLET | Freq: Every day | ORAL | 1 refills | Status: DC

## 2017-07-25 NOTE — Assessment & Plan Note (Signed)
Resuming lexapro starting at 5 mg dose

## 2017-07-25 NOTE — Progress Notes (Signed)
Subjective:  Patient ID: Katelyn Friedman, female    DOB: 11-09-1951  Age: 66 y.o. MRN: [id number]  CC: The primary encounter diagnosis was RUQ pain. Diagnoses of Vitamin D deficiency, Graves disease, Insomnia secondary to anxiety, Other chest pain, Generalized anxiety disorder, and Hospital discharge follow-up were also pertinent to this visit.  HPI Katelyn Friedman presents for  Hospital follow up. She was admitted to [facility] on July 23 with chest pain that started on the day of admission .admitting diagnosis with Unstable angina. She  ruled out for AMI and underwent a stress test which was reported as normal  She was discharged on July 24.   The chest pain had started the day of  Admission , but she had noticed some dyspnea without cough the day before after being in close contact with a smoker .  She has a history of reflux  And states that this chest pain felt different .   She has been having post prandial RUQ pressure, and increased symptoms of anxiety and depression. The symptoms of anxiety began when her husanad was diagnosed with prostate cancer.   Has not had TSH checked since last year.    Outpatient Medications Prior to Visit  Medication Sig Dispense Refill  . acyclovir (ZOVIRAX) 400 MG tablet Take 1 tablet (400 mg total) by mouth daily. 90 tablet 3  . albuterol (PROAIR HFA) 108 (90 Base) MCG/ACT inhaler Inhale 2 puffs into the lungs every 6 (six) hours as needed for wheezing or shortness of breath. 1 Inhaler 1  . aspirin EC 81 MG tablet Take 81 mg by mouth every other day.     . ALPRAZolam (XANAX) 0.5 MG tablet Take 1 tablet (0.5 mg total) by mouth at bedtime as needed for anxiety. (Patient not taking: Reported on 07/25/2017) 30 tablet 3  . esomeprazole (NEXIUM) 20 MG capsule Take 20 mg by mouth daily as needed (For heartburn or acid reflux.).     No facility-administered medications prior to visit.     Review of Systems;  Patient denies headache, fevers, malaise,  unintentional weight loss, skin rash, eye pain, sinus congestion and sinus pain, sore throat, dysphagia,  hemoptysis , cough, dyspnea, wheezing, chest pain, palpitations, orthopnea, edema, abdominal pain, nausea, melena, diarrhea, constipation, flank pain, dysuria, hematuria, urinary  Frequency, nocturia, numbness, tingling, seizures,  Focal weakness, Loss of consciousness,  Tremor, insomnia, depression, anxiety, and suicidal ideation.      Objective:  BP 120/70 (BP Location: Left Arm, Patient Position: Sitting, Cuff Size: Large)   Pulse 64   Temp 98.1 F (36.7 C) (Oral)   Resp 15   Ht 5\' 3"  (1.6 m)   Wt 181 lb 9.6 oz (82.4 kg)   SpO2 98%   BMI 32.17 kg/m   BP Readings from Last 3 Encounters:  07/25/17 120/70  07/19/17 121/63  10/07/16 122/78    Wt Readings from Last 3 Encounters:  07/25/17 181 lb 9.6 oz (82.4 kg)  07/18/17 178 lb 14.4 oz (81.1 kg)  10/07/16 164 lb (74.4 kg)    General appearance: alert, cooperative and appears stated age Ears: normal TM's and external ear canals both ears Throat: lips, mucosa, and tongue normal; teeth and gums normal Neck: no adenopathy, no carotid bruit, supple, symmetrical, trachea midline and thyroid not enlarged, symmetric, no tenderness/mass/nodules Back: symmetric, no curvature. ROM normal. No CVA tenderness. Lungs: clear to auscultation bilaterally Heart: regular rate and rhythm, S1, S2 normal, no murmur, click, rub or gallop Abdomen: soft,  non-tender; bowel sounds normal; no masses,  no organomegaly Pulses: 2+ and symmetric Skin: Skin color, texture, turgor normal. No rashes or lesions Lymph nodes: Cervical, supraclavicular, and axillary nodes normal.  No results found for: HGBA1C  Lab Results  Component Value Date   CREATININE 0.94 07/19/2017   CREATININE 0.98 07/18/2017   CREATININE 0.94 08/25/2016    Lab Results  Component Value Date   WBC 9.3 07/19/2017   HGB 12.3 07/19/2017   HCT 36.4 07/19/2017   PLT 234 07/19/2017    GLUCOSE 100 (H) 07/19/2017   CHOL 199 07/19/2017   TRIG 122 07/19/2017   HDL 60 07/19/2017   LDLDIRECT 151 (H) 08/25/2016   LDLCALC 115 (H) 07/19/2017   ALT 12 07/25/2017   AST 15 07/25/2017   NA 142 07/19/2017   K 3.5 07/19/2017   CL 110 07/19/2017   CREATININE 0.94 07/19/2017   BUN 14 07/19/2017   CO2 26 07/19/2017   TSH 1.96 07/25/2017   INR 1.11 07/18/2017    Dg Chest 2 View  Result Date: 07/18/2017 CLINICAL DATA:  Pt reports chest pain since 0500 this morning. Pt reports taking aspirin but pain continues. Pt reports pain is centralized, radiates to back, some tingling to left arm EXAM: CHEST  2 VIEW COMPARISON:  07/20/2013,  chest x-ray and chest CT FINDINGS: Heart size is normal. Lungs are free of focal consolidations and pleural effusions. There is mild midthoracic spondylosis. IMPRESSION: No active cardiopulmonary disease. Electronically Signed   By: Nolon Nations M.D.   On: 07/18/2017 12:44   Nm Myocar Multi W/spect W/wall Motion / Ef  Result Date: 07/19/2017  The study is normal.  This is a low risk study.  The left ventricular ejection fraction is normal (55-65%).  There was no ST segment deviation noted during stress.  No T wave inversion was noted during stress.  Low risk study. LV function normal No evidence of ischemia    Assessment & Plan:   Problem List Items Addressed This Visit    Chest pain    Noncardiac by noninvasive testing during admission.  Studies reviewed. Referred pain from GB was not considered,,  No hepatic function panel , lipase or U/s was one.  Need to rule out GB as source of pain .  Ultrasound of  gallbladder ordered.       Generalized anxiety disorder    Resuming lexapro starting at 5 mg dose       Graves disease    She remains euthyroid . TSH is normal .   No medications prescribed today.  Repeat in 6 months    Lab Results  Component Value Date   TSH 1.96 07/25/2017         Relevant Orders   TSH (Completed)   T4, free  (Completed)   Hospital discharge follow-up    Patient is stable post discharge and has no new issues or questions about discharge plans at the visit today for hospital follow up. All labs , imaging studies and progress notes from admission were reviewed with patient today        Insomnia secondary to anxiety    No longer taking alprazolam prn insomnia .       Relevant Medications   escitalopram (LEXAPRO) 10 MG tablet   Vitamin D deficiency   Relevant Orders   VITAMIN D 25 Hydroxy (Vit-D Deficiency, Fractures) (Completed)    Other Visit Diagnoses    RUQ pain    -  Primary  Relevant Orders   US Abdomen Limited RUQ   Hepatic function panel (Completed)      I have discontinued Ms. Deller esomeprazole. I am also having her start on escitalopram. Additionally, I am having her maintain her aspirin EC, acyclovir, ALPRAZolam, and albuterol.  Meds ordered this encounter  Medications  . escitalopram (LEXAPRO) 10 MG tablet    Sig: Take 1 tablet (10 mg total) by mouth daily. After dinner30    Dispense:  30 tablet    Refill:  1    Medications Discontinued During This Encounter  Medication Reason  . esomeprazole (NEXIUM) 20 MG capsule Patient has not taken in last 30 days    Follow-up: No Follow-up on file.   [name], MD

## 2017-07-25 NOTE — Assessment & Plan Note (Signed)
She remains euthyroid . TSH is normal .   No medications prescribed today.  Repeat in 6 months    Lab Results  Component Value Date   TSH 1.96 07/25/2017    

## 2017-07-25 NOTE — Assessment & Plan Note (Signed)
Patient is stable post discharge and has no new issues or questions about discharge plans at the visit today for hospital follow up. All labs , imaging studies and progress notes from admission were reviewed with patient today   

## 2017-07-25 NOTE — Patient Instructions (Signed)
Ultrasound of gallbladder has been ordered.  The office will call you with the appointment  checking thyroid and vitamin D today  Starting lexapro for depression/anxiety  Please start the Lexapro (escitalopram) at 1/2 tablet daily in the evening for the first few days to avoid nausea.  You can increase to a full tablet after 4 days if you have  not developed side effects of nausea.  If the lexapro interferes with your sleep, take it in the morning instead

## 2017-07-25 NOTE — Assessment & Plan Note (Signed)
No longer taking alprazolam prn insomnia .

## 2017-07-25 NOTE — Assessment & Plan Note (Addendum)
Noncardiac by noninvasive testing during admission.  Studies reviewed. Referred pain from GB was not considered,,  No hepatic function panel , lipase or U/s was one.  Need to rule out GB as source of pain .  Ultrasound of  gallbladder ordered.

## 2017-07-26 ENCOUNTER — Encounter: Payer: Self-pay | Admitting: Internal Medicine

## 2017-08-01 ENCOUNTER — Ambulatory Visit: Payer: PPO

## 2017-08-03 ENCOUNTER — Ambulatory Visit: Payer: PPO

## 2017-08-04 ENCOUNTER — Ambulatory Visit
Admission: RE | Admit: 2017-08-04 | Discharge: 2017-08-04 | Disposition: A | Discharge status: Home / Self Care | Payer: PPO | Source: Ambulatory Visit | Attending: Internal Medicine | Admitting: Internal Medicine

## 2017-08-04 DIAGNOSIS — R1011 Right upper quadrant pain: Secondary | ICD-10-CM | POA: Diagnosis not present

## 2017-08-04 DIAGNOSIS — K8020 Calculus of gallbladder without cholecystitis without obstruction: Secondary | ICD-10-CM | POA: Diagnosis not present

## 2017-08-08 ENCOUNTER — Telehealth: Payer: Self-pay | Admitting: Internal Medicine

## 2017-08-08 DIAGNOSIS — K8020 Calculus of gallbladder without cholecystitis without obstruction: Secondary | ICD-10-CM

## 2017-08-08 NOTE — Telephone Encounter (Signed)
Looked at last OV, not sure rationale for Referral, please advise, if you had talked about this?

## 2017-08-08 NOTE — Telephone Encounter (Signed)
See ultrasound results .  Referral to Gen surg in process

## 2017-08-08 NOTE — Telephone Encounter (Signed)
Pt called and stated that she would like to go ahead with a referral for general surgery. Please advise, thank you!  Call pt @ [phone]

## 2017-08-09 ENCOUNTER — Encounter: Payer: Self-pay | Admitting: General Surgery

## 2017-08-09 NOTE — Telephone Encounter (Signed)
It has been sent to [facility] for Dr. Bary Castilla

## 2017-08-09 NOTE — Telephone Encounter (Signed)
Noted, thanks!

## 2017-08-22 ENCOUNTER — Ambulatory Visit: Payer: Self-pay | Admitting: General Surgery

## 2017-08-24 ENCOUNTER — Encounter: Payer: Self-pay | Admitting: General Surgery

## 2017-08-24 ENCOUNTER — Ambulatory Visit (INDEPENDENT_AMBULATORY_CARE_PROVIDER_SITE_OTHER): Payer: PPO | Admitting: General Surgery

## 2017-08-24 VITALS — BP 124/70 | HR 72 | Resp 12 | Ht 63.0 in | Wt 183.0 lb

## 2017-08-24 DIAGNOSIS — K8020 Calculus of gallbladder without cholecystitis without obstruction: Secondary | ICD-10-CM

## 2017-08-24 NOTE — Progress Notes (Signed)
Patient ID: Katelyn Friedman, female   DOB: [birth year], 66 y.o.   MRN: [id number]  Chief Complaint  Patient presents with  . Abdominal Pain    HPI Katelyn Friedman is a 66 y.o. female here today for a evaluation of gallstone. Patient states she has been having pain in her lower abdominal area for a year now. The pain comes and go, spicy foods and pizza can trigger attacks.She was wake up in the middle of the night with chest pain on 07/18/2017 and was seen in the ER.Ultrasound done on 08/04/2017. On Monday she noticed a dark black stool. No blood noticed. Moves her bowels every two days.  Husband, Katelyn Friedman is present at visit.  HPI  Past Medical History:  Diagnosis Date  . Allergic rhinitis   . Arthritis   . Asthma   . Family history of adverse reaction to anesthesia    PONV  . GERD (gastroesophageal reflux disease)   . Herpes simplex   . History of colon polyps   . IBS (irritable bowel syndrome)   . PONV (postoperative nausea and vomiting)   . Thyroid disease    graves disease    Past Surgical History:  Procedure Laterality Date  . BREAST BIOPSY Right 2014   negative  . COLONOSCOPY  2006  . KNEE ARTHROSCOPY  1992  . KNEE ARTHROSCOPY Right 06/08/2016   Procedure: ARTHROSCOPY KNEE- partial medial minisectomy and condroplasty;  Surgeon: Melrose Nakayama, MD;  Location: [facility];  Service: Orthopedics;  Laterality: Right;  . ROTATOR CUFF REPAIR  2006   Margaretmary Eddy   . TONSILLECTOMY    . TUBAL LIGATION      Family History  Problem Relation Age of Onset  . Stroke Mother   . Alzheimer's disease Mother   . Hypertension Mother   . Colon polyps Maternal Aunt   . Cancer Maternal Aunt        colon  . Breast cancer Neg Hx     Social History Social History  Substance Use Topics  . Smoking status: Never Smoker  . Smokeless tobacco: Never Used  . Alcohol use No    Allergies  Allergen Reactions  . Influenza Vaccines Swelling and Other (See Comments)    Redness, Fever  . Codeine  Nausea Only  . Mobic [Meloxicam] Itching and Rash    Current Outpatient Prescriptions  Medication Sig Dispense Refill  . acyclovir (ZOVIRAX) 400 MG tablet Take 1 tablet (400 mg total) by mouth daily. 90 tablet 3  . albuterol (PROAIR HFA) 108 (90 Base) MCG/ACT inhaler Inhale 2 puffs into the lungs every 6 (six) hours as needed for wheezing or shortness of breath. 1 Inhaler 1  . aspirin EC 81 MG tablet Take 81 mg by mouth every other day.     . escitalopram (LEXAPRO) 10 MG tablet Take 1 tablet (10 mg total) by mouth daily. After dinner30 (Patient taking differently: Take 10 mg by mouth as needed. After dinner30) 30 tablet 1   No current facility-administered medications for this visit.     Review of Systems Review of Systems  Constitutional: Negative.   Respiratory: Negative.   Cardiovascular: Negative.     Blood pressure 124/70, pulse 72, resp. rate 12, height 5\' 3"  (1.6 m), weight 183 lb (83 kg).  Physical Exam Physical Exam  Constitutional: She is oriented to person, place, and time. She appears well-developed and well-nourished.  Eyes: Conjunctivae are normal. No scleral icterus.  Neck: Neck supple.  Cardiovascular: Normal rate and  regular rhythm.   Pulmonary/Chest: Effort normal and breath sounds normal.        Abdominal: Soft. Normal appearance and bowel sounds are normal. There is no hepatomegaly. There is tenderness in the right upper quadrant. No hernia.    Lymphadenopathy:    She has no cervical adenopathy.  Neurological: She is alert and oriented to person, place, and time.  Skin: Skin is dry.    Data Reviewed 07/18/2017 laboratory studies including CBC, basic metabolic panel, troponin 3 reviewed. Normal. 07/25/2017 liver panel: Normal.  07/19/2017 nuclear stress test:  The study is normal.  This is a low risk study.  The left ventricular ejection fraction is normal (55-65%).  There was no ST segment deviation noted during stress.  No T wave  inversion was noted during stress.   Low risk study. LV function normal No evidence of ischemia  08/04/2017 abdominal ultrasound: Cholelithiasis without evidence of cholecystitis. Tiny, mobile gallstones with associated sludge. No gallbladder wall thickening. CBD: 3-4 mm.  Assessment    Episode of severe nocturnal pain without cardiac source, likely related to biliary tree.    Plan    I explained to the patient and her husband it's impossible to prove that the episode that occurred in July was related to the biliary tract but she does "hit for the rotation" in terms of risk factors for biliary tract disease based on age, gender, parity and body mass index.  Options for management include 1) observation with potential for urgent intervention with recurrent episodes versus 2) elective cholecystectomy. Risk of common bile duct injury is small at 1 600 but is a serious complication.   At this time, patient is planning to proceed with elective cholecystectomy.   Laparoscopic Cholecystectomy with Intraoperative Cholangiogram. The procedure, including it's potential risks and complications (including but not limited to infection, bleeding, injury to intra-abdominal organs or bile ducts, bile leak, poor cosmetic result, sepsis and death) were discussed with the patient in detail. Non-operative options, including their inherent risks (acute calculous cholecystitis with possible choledocholithiasis or gallstone pancreatitis, with the risk of ascending cholangitis, sepsis, and death) were discussed as well. The patient expressed and understanding of what we discussed and wishes to proceed with laparoscopic cholecystectomy. The patient further understands that if it is technically not possible, or it is unsafe to proceed laparoscopically, that I will convert to an open cholecystectomy.  HPI, Physical Exam, Assessment and Plan have been scribed under the direction and in the presence of Hervey Ard,  MD.  Gaspar Cola, CMA  I have completed the exam and reviewed the above documentation for accuracy and completeness.  I agree with the above.  Haematologist has been used and any errors in dictation or transcription are unintentional.  Hervey Ard, M.D., F.A.C.S.   Robert Bellow 08/25/2017, 7:48 AM  Patient's surgery has been scheduled for 09-05-17 at [facility]. It is okay for patient to continue an 81 mg aspirin every other day prior to surgery. She is aware to hold day of procedure.   Dominga Ferry, CMA

## 2017-08-24 NOTE — Patient Instructions (Signed)

## 2017-08-25 DIAGNOSIS — K8020 Calculus of gallbladder without cholecystitis without obstruction: Secondary | ICD-10-CM | POA: Insufficient documentation

## 2017-08-30 ENCOUNTER — Ambulatory Visit: Payer: PPO

## 2017-08-31 ENCOUNTER — Inpatient Hospital Stay
Admission: RE | Admit: 2017-08-31 | Discharge: 2017-08-31 | Disposition: A | Discharge status: Home / Self Care | Payer: PPO | Source: Ambulatory Visit

## 2017-08-31 ENCOUNTER — Telehealth: Payer: Self-pay | Admitting: *Deleted

## 2017-08-31 NOTE — Telephone Encounter (Signed)
Patient called and wants to reschedule her surgery that was scheduled for 09/05/17. She has a funeral to attend in [place].

## 2017-08-31 NOTE — Pre-Procedure Instructions (Signed)
Consult Note Date of Service: 07/19/2017 7:06 AM Fath, Javier Docker, MD  Cardiology  Expand All Collapse All   [] Hide copied text [] Hover for attribution information     [facility] CONSULT NOTE  Patient ID: Katelyn Friedman MRN: [id number] DOB/AGE: [birth year] 66 y.o.  Admit date: 07/18/2017 Referring Physician Dr. Anselm Jungling Primary Physician Dr. Derrel Nip Primary Cardiologist Dr. Clayborn Bigness Reason for Consultation chest pain  HPI: Patient is a 66 year old female with no prior cardiac history, history of gastroesophageal reflux disease as well as history of some stress at home due to illness in her husband who presented to the emergency room with epigastric chest pain that lasted approximately 1-1-1/2 hours 24 hours prior to presenting to the emergency room. She awoke from sleep on the day prior to presentation with some epigastric pain. It caused her to get up from bed. It radiated through to her back and she had some tingling in her left arm. She had no shortness of breath. She has not had pain similar to this in the past. She has seen a cardiologist about 5 years ago and had a stress test which per her report was unremarkable. Records are currently not available. She called her primary physician office regarding the epigastric pain on Monday morning and was told emergency room. Electrocardiogram on presentation revealed sinus rhythm with anterolateral T-wave inversions. She had similar findings on previous electrocardiogram. She has ruled out for a myocardial infarction with normal serum 3. She had no further discomfort since the episode 24 hours prior to presentation. Risk factors include secondary tobacco smoke however denies hypertension, hyperlipidemia, family history. She denies any rapid or irregular heartbeat. She is quite active at home and has no symptoms with this.  Review of Systems  Constitutional: Negative.   HENT:  Negative.   Eyes: Negative.   Respiratory: Negative.   Cardiovascular: Positive for chest pain.  Gastrointestinal: Positive for heartburn.  Genitourinary: Negative.   Musculoskeletal: Negative.   Skin: Negative.   Neurological: Negative.   Endo/Heme/Allergies: Negative.   Psychiatric/Behavioral: Negative.         Past Medical History:  Diagnosis Date  . Allergic rhinitis   . Arthritis   . Asthma   . Family history of adverse reaction to anesthesia    PONV  . GERD (gastroesophageal reflux disease)   . Herpes simplex   . History of colon polyps   . IBS (irritable bowel syndrome)   . PONV (postoperative nausea and vomiting)   . Thyroid disease    graves disease         Family History  Problem Relation Age of Onset  . Stroke Mother   . Alzheimer's disease Mother   . Hypertension Mother   . Colon polyps Maternal Aunt   . Cancer Maternal Aunt        colon  . Breast cancer Neg Hx     Social History        Social History  . Marital status: Married    Spouse name: N/A  . Number of children: N/A  . Years of education: N/A      Occupational History  . Not on file.        Social History Main Topics  . Smoking status: Never Smoker  . Smokeless tobacco: Never Used  . Alcohol use No  . Drug use: No  . Sexual activity: Yes    Partners: Male       Other Topics Concern  .  Not on file      Social History Narrative   Married: 19 yrs   Regular exercise: walk   Caffeine use: none         Past Surgical History:  Procedure Laterality Date  . BREAST BIOPSY Right 2014   negative  . COLONOSCOPY  2006  . KNEE ARTHROSCOPY  1992  . KNEE ARTHROSCOPY Right 06/08/2016   Procedure: ARTHROSCOPY KNEE- partial medial minisectomy and condroplasty;  Surgeon: Melrose Nakayama, MD;  Location: [facility];  Service: Orthopedics;  Laterality: Right;  . ROTATOR CUFF REPAIR  2006   Margaretmary Eddy   . TONSILLECTOMY    . TUBAL LIGATION               Prescriptions Prior to Admission  Medication Sig Dispense Refill Last Dose  . acyclovir (ZOVIRAX) 400 MG tablet Take 1 tablet (400 mg total) by mouth daily. 90 tablet 3 unknown at unknown  . albuterol (PROAIR HFA) 108 (90 Base) MCG/ACT inhaler Inhale 2 puffs into the lungs every 6 (six) hours as needed for wheezing or shortness of breath. 1 Inhaler 1   . ALPRAZolam (XANAX) 0.5 MG tablet Take 1 tablet (0.5 mg total) by mouth at bedtime as needed for anxiety. 30 tablet 3 prn at prn  . aspirin EC 81 MG tablet Take 81 mg by mouth every other day.    unknown at unknown  . esomeprazole (NEXIUM) 20 MG capsule Take 20 mg by mouth daily as needed (For heartburn or acid reflux.).   unknown at unknown    Physical Exam: Blood pressure 115/62, pulse (!) 57, temperature 98.7 F (37.1 C), temperature source Oral, resp. rate 17, height 5\' 3"  (1.6 m), weight 81.1 kg (178 lb 14.4 oz), SpO2 100 %.      Wt Readings from Last 1 Encounters:  07/18/17 81.1 kg (178 lb 14.4 oz)     General appearance: alert and cooperative Head: Normocephalic, without obvious abnormality, atraumatic Resp: clear to auscultation bilaterally Chest wall: no tenderness Cardio: regular rate and rhythm GI: soft, non-tender; bowel sounds normal; no masses,  no organomegaly Extremities: extremities normal, atraumatic, no cyanosis or edema Pulses: 2+ and symmetric Neurologic: Grossly normal  Labs:   RecentLabs       Lab Results  Component Value Date   WBC 9.3 07/19/2017   HGB 12.3 07/19/2017   HCT 36.4 07/19/2017   MCV 85.7 07/19/2017   PLT 234 07/19/2017      Recent Labs Lab 07/19/17 0058  NA 142  K 3.5  CL 110  CO2 26  BUN 14  CREATININE 0.94  CALCIUM 8.6*  GLUCOSE 100*   RecentLabs  Lab Results  Component Value Date   TROPONINI <0.03 07/18/2017        Radiology: No acute cardiopulmonary disease on chest x-ray. EKG: Sinus rhythm with anterolateral T-wave inversions unchanged from  EKG done in 2014.  ASSESSMENT AND PLAN:  66 year old female with no prior cardiac history who had an episode of one and half hours of epigastric discomfort radiating through to her back and tingling in her left arm. This occurred approximately 24-36 hours prior to presenting to the emergency room. Workup in the ER is been unremarkable. Electrocardiogram showed no ischemia. Cardiac markers showed no evidence of myocardial infarction. She has ruled out for myocardial infarction. Specifically typical features. We'll proceed with a functional study to evaluate for possible ischemic etiology of chest pain with further recommendations after this is completed. We'll continue heparin until functional study  is read. We will continue to defer beta blocker due to relative bradycardia. We'll continue with enteric-coated aspirin. Signed: Teodoro Spray MD, [facility] 07/19/2017, 7:06 AM        Electronically signed by Teodoro Spray, MD at 07/19/2017 7:12 AM      ED to Hosp-Admission (Discharged) on 07/18/2017        Routing History        Detailed Report

## 2017-08-31 NOTE — Telephone Encounter (Signed)
Patient was contacted today and surgery has been rescheduled to 09-21-17 per patient's request.   O. R. notified of date change.

## 2017-09-01 ENCOUNTER — Encounter: Payer: PPO | Admitting: Internal Medicine

## 2017-09-01 ENCOUNTER — Ambulatory Visit: Payer: PPO

## 2017-09-12 ENCOUNTER — Inpatient Hospital Stay
Admission: RE | Admit: 2017-09-12 | Discharge: 2017-09-12 | Disposition: A | Discharge status: Home / Self Care | Payer: PPO | Source: Ambulatory Visit

## 2017-09-12 NOTE — Pre-Procedure Instructions (Signed)
Progress Notes Encounter Date: 07/25/2017 [name], MD  Internal Medicine  Expand All Collapse All   [] Hide copied text   Subjective:  Patient ID: Katelyn Friedman, female    DOB: Jun 03, 1951  Age: 66 y.o. MRN: [id number]  CC: The primary encounter diagnosis was RUQ pain. Diagnoses of Vitamin D deficiency, Graves disease, Insomnia secondary to anxiety, Other chest pain, Generalized anxiety disorder, and Hospital discharge follow-up were also pertinent to this visit.  HPI Katelyn Friedman presents for  Hospital follow up. She was admitted to [facility] on July 23 with chest pain that started on the day of admission .admitting diagnosis with Unstable angina. She  ruled out for AMI and underwent a stress test which was reported as normal  She was discharged on July 24.   The chest pain had started the day of  Admission , but she had noticed some dyspnea without cough the day before after being in close contact with a smoker .  She has a history of reflux  And states that this chest pain felt different .   She has been having post prandial RUQ pressure, and increased symptoms of anxiety and depression. The symptoms of anxiety began when her husanad was diagnosed with prostate cancer.   Has not had TSH checked since last year.          Outpatient Medications Prior to Visit  Medication Sig Dispense Refill  . acyclovir (ZOVIRAX) 400 MG tablet Take 1 tablet (400 mg total) by mouth daily. 90 tablet 3  . albuterol (PROAIR HFA) 108 (90 Base) MCG/ACT inhaler Inhale 2 puffs into the lungs every 6 (six) hours as needed for wheezing or shortness of breath. 1 Inhaler 1  . aspirin EC 81 MG tablet Take 81 mg by mouth every other day.     . ALPRAZolam (XANAX) 0.5 MG tablet Take 1 tablet (0.5 mg total) by mouth at bedtime as needed for anxiety. (Patient not taking: Reported on 07/25/2017) 30 tablet 3  . esomeprazole (NEXIUM) 20 MG capsule Take 20 mg by mouth daily as needed (For heartburn or acid  reflux.).     No facility-administered medications prior to visit.     Review of Systems;  Patient denies headache, fevers, malaise, unintentional weight loss, skin rash, eye pain, sinus congestion and sinus pain, sore throat, dysphagia,  hemoptysis , cough, dyspnea, wheezing, chest pain, palpitations, orthopnea, edema, abdominal pain, nausea, melena, diarrhea, constipation, flank pain, dysuria, hematuria, urinary  Frequency, nocturia, numbness, tingling, seizures,  Focal weakness, Loss of consciousness,  Tremor, insomnia, depression, anxiety, and suicidal ideation.      Objective:  BP 120/70 (BP Location: Left Arm, Patient Position: Sitting, Cuff Size: Large)   Pulse 64   Temp 98.1 F (36.7 C) (Oral)   Resp 15   Ht 5\' 3"  (1.6 m)   Wt 181 lb 9.6 oz (82.4 kg)   SpO2 98%   BMI 32.17 kg/m      BP Readings from Last 3 Encounters:  07/25/17 120/70  07/19/17 121/63  10/07/16 122/78       Wt Readings from Last 3 Encounters:  07/25/17 181 lb 9.6 oz (82.4 kg)  07/18/17 178 lb 14.4 oz (81.1 kg)  10/07/16 164 lb (74.4 kg)    General appearance: alert, cooperative and appears stated age Ears: normal TM's and external ear canals both ears Throat: lips, mucosa, and tongue normal; teeth and gums normal Neck: no adenopathy, no carotid bruit, supple, symmetrical, trachea midline and thyroid  not enlarged, symmetric, no tenderness/mass/nodules Back: symmetric, no curvature. ROM normal. No CVA tenderness. Lungs: clear to auscultation bilaterally Heart: regular rate and rhythm, S1, S2 normal, no murmur, click, rub or gallop Abdomen: soft, non-tender; bowel sounds normal; no masses,  no organomegaly Pulses: 2+ and symmetric Skin: Skin color, texture, turgor normal. No rashes or lesions Lymph nodes: Cervical, supraclavicular, and axillary nodes normal.  RecentLabs  No results found for: HGBA1C    RecentLabs       Lab Results  Component Value Date   CREATININE 0.94  07/19/2017   CREATININE 0.98 07/18/2017   CREATININE 0.94 08/25/2016      RecentLabs       Lab Results  Component Value Date   WBC 9.3 07/19/2017   HGB 12.3 07/19/2017   HCT 36.4 07/19/2017   PLT 234 07/19/2017   GLUCOSE 100 (H) 07/19/2017   CHOL 199 07/19/2017   TRIG 122 07/19/2017   HDL 60 07/19/2017   LDLDIRECT 151 (H) 08/25/2016   LDLCALC 115 (H) 07/19/2017   ALT 12 07/25/2017   AST 15 07/25/2017   NA 142 07/19/2017   K 3.5 07/19/2017   CL 110 07/19/2017   CREATININE 0.94 07/19/2017   BUN 14 07/19/2017   CO2 26 07/19/2017   TSH 1.96 07/25/2017   INR 1.11 07/18/2017      Dg Chest 2 View  Result Date: 07/18/2017 CLINICAL DATA:  Pt reports chest pain since 0500 this morning. Pt reports taking aspirin but pain continues. Pt reports pain is centralized, radiates to back, some tingling to left arm EXAM: CHEST  2 VIEW COMPARISON:  07/20/2013,  chest x-ray and chest CT FINDINGS: Heart size is normal. Lungs are free of focal consolidations and pleural effusions. There is mild midthoracic spondylosis. IMPRESSION: No active cardiopulmonary disease. Electronically Signed   By: Nolon Nations M.D.   On: 07/18/2017 12:44   Nm Myocar Multi W/spect W/wall Motion / Ef  Result Date: 07/19/2017  The study is normal.  This is a low risk study.  The left ventricular ejection fraction is normal (55-65%).  There was no ST segment deviation noted during stress.  No T wave inversion was noted during stress.  Low risk study. LV function normal No evidence of ischemia    Assessment & Plan:      Problem List Items Addressed This Visit    Chest pain    Noncardiac by noninvasive testing during admission.  Studies reviewed. Referred pain from GB was not considered,,  No hepatic function panel , lipase or U/s was one.  Need to rule out GB as source of pain .  Ultrasound of  gallbladder ordered.       Generalized anxiety disorder    Resuming  lexapro starting at 5 mg dose       Graves disease    She remains euthyroid . TSH is normal .   No medications prescribed today.  Repeat in 6 months         Lab Results  Component Value Date   TSH 1.96 07/25/2017         Relevant Orders   TSH (Completed)   T4, free (Completed)   Hospital discharge follow-up    Patient is stable post discharge and has no new issues or questions about discharge plans at the visit today for hospital follow up. All labs , imaging studies and progress notes from admission were reviewed with patient today        Insomnia secondary  to anxiety    No longer taking alprazolam prn insomnia .       Relevant Medications   escitalopram (LEXAPRO) 10 MG tablet   Vitamin D deficiency   Relevant Orders   VITAMIN D 25 Hydroxy (Vit-D Deficiency, Fractures) (Completed)          Other Visit Diagnoses    RUQ pain    -  Primary   Relevant Orders   US Abdomen Limited RUQ   Hepatic function panel (Completed)      I have discontinued Katelyn Friedman esomeprazole. I am also having her start on escitalopram. Additionally, I am having her maintain her aspirin EC, acyclovir, ALPRAZolam, and albuterol.      Meds ordered this encounter  Medications  . escitalopram (LEXAPRO) 10 MG tablet    Sig: Take 1 tablet (10 mg total) by mouth daily. After dinner30    Dispense:  30 tablet    Refill:  1        Medications Discontinued During This Encounter  Medication Reason  . esomeprazole (NEXIUM) 20 MG capsule Patient has not taken in last 30 days    Follow-up: No Follow-up on file.   [name], MD    Electronically signed by [name], MD at 07/25/2017 8:58 PM      Office Visit on 07/25/2017        Detailed Report

## 2017-09-13 ENCOUNTER — Encounter
Admission: RE | Admit: 2017-09-13 | Discharge: 2017-09-13 | Disposition: A | Discharge status: Home / Self Care | Payer: PPO | Source: Ambulatory Visit | Attending: General Surgery | Admitting: General Surgery

## 2017-09-13 DIAGNOSIS — Z01818 Encounter for other preprocedural examination: Secondary | ICD-10-CM | POA: Diagnosis present

## 2017-09-13 NOTE — Patient Instructions (Addendum)
  Your procedure is scheduled on:  Wed. 09/21/17 Report to Day Surgery. To find out your arrival time please call [phone] between 1PM - 3PM on 09/20/17  Remember: Instructions that are not followed completely may result in serious medical risk, up to and including death, or upon the discretion of your surgeon and anesthesiologist your surgery may need to be rescheduled.    __x__ 1. Do not eat food after midnight the night before your procedure. No gum chewing or hard candies. You may drink clear liquids up to 2 hours before you are scheduled to arrive for your surgery- DO not drink clear liquids within 2 hours of the start of your surgery.  Clear Liquids include: water, apple juice without pulp, clear carbohydrate drink such as Clearfast of Gartorade, Black Coffee or Tea (Do not add anything to coffee or tea).    __x__ 2. No Alcohol for 24 hours before or after surgery.   ____ 3. Do Not Smoke For 24 Hours Prior to Your Surgery.   ____ 4. Bring all medications with you on the day of surgery if instructed.    ____ 5. Notify your doctor if there is any change in your medical condition     (cold, fever, infections).       Do not wear jewelry, make-up, hairpins, clips or nail polish.  Do not wear lotions, powders, or perfumes. You may wear deodorant.  Do not shave 48 hours prior to surgery. Men may shave face and neck.  Do not bring valuables to the hospital.    [facility] is not responsible for any belongings or valuables.               Contacts, dentures or bridgework may not be worn into surgery.  Leave your suitcase in the car. After surgery it may be brought to your room.  For patients admitted to the hospital, discharge time is determined by your                treatment team.   Patients discharged the day of surgery will not be allowed to drive home.   Please read over the following fact sheets that you were given:      _x___ Take these medicines the  morning of surgery with A SIP OF WATER:    1. none  2.   3.   4.  5.  6.  ____ Fleet Enema (as directed)   __x__ Use CHG Soap as directed  __x__ Use inhalers on the day of surgery albuterol (PROAIR HFA) 108 (90 Base) MCG/ACT inhaler and bring with you to the hospital  ____ Stop metformin 2 days prior to surgery    ____ Take 1/2 of usual insulin dose the night before surgery and none on the morning of surgery.   _x___ Stop aspirin on 09/14/17  __x__ Stop Anti-inflammatories on Tylenol only after 09/14/17   ____ Stop supplements until after surgery.    ____ Bring C-Pap to the hospital.

## 2017-09-15 ENCOUNTER — Telehealth: Payer: Self-pay | Admitting: *Deleted

## 2017-09-15 NOTE — Telephone Encounter (Signed)
Patient called to advise that she is still having dark stools when she moves her bowels. Denies any pain or any changes. Offered to speak with nurse, said not necessary and had to go. She just wanted you to be aware since she is having surgery on 09/21/17. Please advise.

## 2017-09-21 ENCOUNTER — Ambulatory Visit
Admission: RE | Admit: 2017-09-21 | Discharge: 2017-09-21 | Disposition: A | Discharge status: Home / Self Care | Payer: PPO | Source: Ambulatory Visit | Attending: General Surgery | Admitting: General Surgery

## 2017-09-21 ENCOUNTER — Ambulatory Visit: Payer: PPO

## 2017-09-21 ENCOUNTER — Ambulatory Visit: Payer: PPO | Admitting: Anesthesiology

## 2017-09-21 ENCOUNTER — Encounter: Payer: Self-pay | Admitting: *Deleted

## 2017-09-21 ENCOUNTER — Encounter
Admission: RE | Disposition: A | Discharge status: Home / Self Care | Payer: Self-pay | Source: Ambulatory Visit | Attending: General Surgery

## 2017-09-21 DIAGNOSIS — J45909 Unspecified asthma, uncomplicated: Secondary | ICD-10-CM | POA: Insufficient documentation

## 2017-09-21 DIAGNOSIS — K589 Irritable bowel syndrome without diarrhea: Secondary | ICD-10-CM | POA: Diagnosis not present

## 2017-09-21 DIAGNOSIS — K8010 Calculus of gallbladder with chronic cholecystitis without obstruction: Secondary | ICD-10-CM | POA: Diagnosis not present

## 2017-09-21 DIAGNOSIS — K219 Gastro-esophageal reflux disease without esophagitis: Secondary | ICD-10-CM | POA: Insufficient documentation

## 2017-09-21 DIAGNOSIS — Z86010 Personal history of colonic polyps: Secondary | ICD-10-CM | POA: Diagnosis not present

## 2017-09-21 DIAGNOSIS — Z419 Encounter for procedure for purposes other than remedying health state, unspecified: Secondary | ICD-10-CM

## 2017-09-21 DIAGNOSIS — E0590 Thyrotoxicosis, unspecified without thyrotoxic crisis or storm: Secondary | ICD-10-CM | POA: Diagnosis not present

## 2017-09-21 DIAGNOSIS — K8020 Calculus of gallbladder without cholecystitis without obstruction: Secondary | ICD-10-CM | POA: Diagnosis not present

## 2017-09-21 DIAGNOSIS — M1990 Unspecified osteoarthritis, unspecified site: Secondary | ICD-10-CM | POA: Insufficient documentation

## 2017-09-21 HISTORY — PX: CHOLECYSTECTOMY: SHX55

## 2017-09-21 LAB — CBC WITH DIFFERENTIAL/PLATELET
Basophils Absolute: 0 10*3/uL (ref 0–0.1)
Basophils Relative: 1 %
Eosinophils Absolute: 0.1 10*3/uL (ref 0–0.7)
Eosinophils Relative: 1 %
HCT: 41.8 % (ref 35.0–47.0)
Hemoglobin: 13.7 g/dL (ref 12.0–16.0)
Lymphocytes Relative: 31 %
Lymphs Abs: 2.2 10*3/uL (ref 1.0–3.6)
MCH: 27.9 pg (ref 26.0–34.0)
MCHC: 32.8 g/dL (ref 32.0–36.0)
MCV: 84.9 fL (ref 80.0–100.0)
Monocytes Absolute: 0.4 10*3/uL (ref 0.2–0.9)
Monocytes Relative: 5 %
Neutro Abs: 4.3 10*3/uL (ref 1.4–6.5)
Neutrophils Relative %: 62 %
Platelets: 250 10*3/uL (ref 150–440)
RBC: 4.92 MIL/uL (ref 3.80–5.20)
RDW: 15.9 % — ABNORMAL HIGH (ref 11.5–14.5)
WBC: 7 10*3/uL (ref 3.6–11.0)

## 2017-09-21 SURGERY — LAPAROSCOPIC CHOLECYSTECTOMY WITH INTRAOPERATIVE CHOLANGIOGRAM
Anesthesia: General | Site: Abdomen | Wound class: Clean

## 2017-09-21 MED ORDER — ONDANSETRON HCL 4 MG/2ML IJ SOLN
INTRAMUSCULAR | Status: AC
  Filled 2017-09-21: qty 2

## 2017-09-21 MED ORDER — SUGAMMADEX SODIUM 200 MG/2ML IV SOLN
INTRAVENOUS | Status: AC
  Filled 2017-09-21: qty 2

## 2017-09-21 MED ORDER — FENTANYL CITRATE (PF) 100 MCG/2ML IJ SOLN
INTRAMUSCULAR | Status: AC
  Filled 2017-09-21: qty 2

## 2017-09-21 MED ORDER — HYDROCODONE-ACETAMINOPHEN 5-325 MG PO TABS: 1.0000 | ORAL_TABLET | ORAL | 0 refills | Status: DC | PRN

## 2017-09-21 MED ORDER — PROPOFOL 500 MG/50ML IV EMUL
INTRAVENOUS | Status: AC
  Filled 2017-09-21: qty 50

## 2017-09-21 MED ORDER — LACTATED RINGERS IV SOLN
INTRAVENOUS | Status: DC
  Administered 2017-09-21: 12:00:00 via INTRAVENOUS

## 2017-09-21 MED ORDER — KETOROLAC TROMETHAMINE 30 MG/ML IJ SOLN
INTRAMUSCULAR | Status: DC | PRN
  Administered 2017-09-21: 30 mg via INTRAVENOUS

## 2017-09-21 MED ORDER — MIDAZOLAM HCL 2 MG/2ML IJ SOLN
INTRAMUSCULAR | Status: AC
  Filled 2017-09-21: qty 2

## 2017-09-21 MED ORDER — FAMOTIDINE 20 MG PO TABS
ORAL_TABLET | ORAL | Status: AC
  Administered 2017-09-21: 20 mg via ORAL
  Filled 2017-09-21: qty 1

## 2017-09-21 MED ORDER — SODIUM CHLORIDE 0.9 % IV SOLN
INTRAVENOUS | Status: DC | PRN
  Administered 2017-09-21: 5 mL

## 2017-09-21 MED ORDER — ACETAMINOPHEN 10 MG/ML IV SOLN
INTRAVENOUS | Status: AC
  Filled 2017-09-21: qty 100

## 2017-09-21 MED ORDER — LIDOCAINE HCL (CARDIAC) 20 MG/ML IV SOLN
INTRAVENOUS | Status: DC | PRN
  Administered 2017-09-21: 80 mg via INTRAVENOUS

## 2017-09-21 MED ORDER — SUGAMMADEX SODIUM 200 MG/2ML IV SOLN
INTRAVENOUS | Status: DC | PRN
  Administered 2017-09-21 (×2): 170 mg via INTRAVENOUS

## 2017-09-21 MED ORDER — FENTANYL CITRATE (PF) 100 MCG/2ML IJ SOLN
INTRAMUSCULAR | Status: DC | PRN
  Administered 2017-09-21 (×3): 50 ug via INTRAVENOUS

## 2017-09-21 MED ORDER — DEXAMETHASONE SODIUM PHOSPHATE 10 MG/ML IJ SOLN
INTRAMUSCULAR | Status: AC
  Filled 2017-09-21: qty 1

## 2017-09-21 MED ORDER — PROPOFOL 10 MG/ML IV BOLUS
INTRAVENOUS | Status: DC | PRN
  Administered 2017-09-21: 150 mg via INTRAVENOUS

## 2017-09-21 MED ORDER — FAMOTIDINE 20 MG PO TABS
20.0000 mg | ORAL_TABLET | Freq: Once | ORAL | Status: AC
  Administered 2017-09-21: 20 mg via ORAL

## 2017-09-21 MED ORDER — FENTANYL CITRATE (PF) 100 MCG/2ML IJ SOLN: 25.0000 ug | INTRAMUSCULAR | Status: DC | PRN

## 2017-09-21 MED ORDER — MIDAZOLAM HCL 2 MG/2ML IJ SOLN
INTRAMUSCULAR | Status: DC | PRN
  Administered 2017-09-21: 2 mg via INTRAVENOUS

## 2017-09-21 MED ORDER — ROCURONIUM BROMIDE 100 MG/10ML IV SOLN
INTRAVENOUS | Status: DC | PRN
  Administered 2017-09-21: 45 mg via INTRAVENOUS

## 2017-09-21 MED ORDER — ACETAMINOPHEN 10 MG/ML IV SOLN
INTRAVENOUS | Status: DC | PRN
  Administered 2017-09-21: 1000 mg via INTRAVENOUS

## 2017-09-21 MED ORDER — PROPOFOL 10 MG/ML IV BOLUS
INTRAVENOUS | Status: AC
  Filled 2017-09-21: qty 20

## 2017-09-21 MED ORDER — KETOROLAC TROMETHAMINE 30 MG/ML IJ SOLN
INTRAMUSCULAR | Status: AC
  Filled 2017-09-21: qty 1

## 2017-09-21 MED ORDER — DEXAMETHASONE SODIUM PHOSPHATE 10 MG/ML IJ SOLN
INTRAMUSCULAR | Status: DC | PRN
  Administered 2017-09-21: 10 mg via INTRAVENOUS

## 2017-09-21 MED ORDER — ONDANSETRON HCL 4 MG/2ML IJ SOLN
INTRAMUSCULAR | Status: DC | PRN
  Administered 2017-09-21 (×2): 4 mg via INTRAVENOUS

## 2017-09-21 MED ORDER — ONDANSETRON HCL 4 MG/2ML IJ SOLN: 4.0000 mg | Freq: Once | INTRAMUSCULAR | Status: DC | PRN

## 2017-09-21 MED ORDER — LIDOCAINE HCL (PF) 2 % IJ SOLN
INTRAMUSCULAR | Status: AC
  Filled 2017-09-21: qty 4

## 2017-09-21 SURGICAL SUPPLY — 39 items
APPLIER CLIP ROT 10 11.4 M/L (STAPLE) ×2
APR CLP MED LRG 11.4X10 (STAPLE) ×1
BAG SPEC RTRVL LRG 6X4 10 (ENDOMECHANICALS) ×1
BLADE SURG 11 STRL SS SAFETY (MISCELLANEOUS) ×2 IMPLANT
CANISTER SUCT 1200ML W/VALVE (MISCELLANEOUS) ×2 IMPLANT
CANNULA DILATOR 10 W/SLV (CANNULA) ×2 IMPLANT
CANNULA DILATOR 5 W/SLV (CANNULA) ×4 IMPLANT
CATH CHOLANG 76X19 KUMAR (CATHETERS) ×2 IMPLANT
CHLORAPREP W/TINT 26ML (MISCELLANEOUS) ×2 IMPLANT
CLIP APPLIE ROT 10 11.4 M/L (STAPLE) ×1 IMPLANT
CONRAY 60ML FOR OR (MISCELLANEOUS) ×2 IMPLANT
DISSECTOR KITTNER STICK (MISCELLANEOUS) ×1 IMPLANT
DISSECTORS/KITTNER STICK (MISCELLANEOUS) ×2
DRAPE SHEET LG 3/4 BI-LAMINATE (DRAPES) ×2 IMPLANT
DRSG TEGADERM 2-3/8X2-3/4 SM (GAUZE/BANDAGES/DRESSINGS) ×8 IMPLANT
DRSG TELFA 4X3 1S NADH ST (GAUZE/BANDAGES/DRESSINGS) ×2 IMPLANT
ELECT REM PT RETURN 9FT ADLT (ELECTROSURGICAL) ×2
ELECTRODE REM PT RTRN 9FT ADLT (ELECTROSURGICAL) ×1 IMPLANT
GLOVE BIO SURGEON STRL SZ7.5 (GLOVE) ×2 IMPLANT
GLOVE INDICATOR 8.0 STRL GRN (GLOVE) ×2 IMPLANT
GOWN STRL REUS W/ TWL LRG LVL3 (GOWN DISPOSABLE) ×3 IMPLANT
GOWN STRL REUS W/TWL LRG LVL3 (GOWN DISPOSABLE) ×6
IRRIGATION STRYKERFLOW (MISCELLANEOUS) ×1 IMPLANT
IRRIGATOR STRYKERFLOW (MISCELLANEOUS) ×2
IV LACTATED RINGERS 1000ML (IV SOLUTION) ×2 IMPLANT
KIT RM TURNOVER STRD PROC AR (KITS) ×2 IMPLANT
LABEL OR SOLS (LABEL) ×2 IMPLANT
NDL INSUFF ACCESS 14 VERSASTEP (NEEDLE) ×2 IMPLANT
NS IRRIG 500ML POUR BTL (IV SOLUTION) ×2 IMPLANT
PACK LAP CHOLECYSTECTOMY (MISCELLANEOUS) ×2 IMPLANT
POUCH SPECIMEN RETRIEVAL 10MM (ENDOMECHANICALS) ×2 IMPLANT
SCISSORS METZENBAUM CVD 33 (INSTRUMENTS) ×2 IMPLANT
STRIP CLOSURE SKIN 1/2X4 (GAUZE/BANDAGES/DRESSINGS) ×2 IMPLANT
SUT VIC AB 0 CT2 27 (SUTURE) ×2 IMPLANT
SUT VIC AB 4-0 FS2 27 (SUTURE) ×2 IMPLANT
SWABSTK COMLB BENZOIN TINCTURE (MISCELLANEOUS) ×2 IMPLANT
TROCAR XCEL NON-BLD 11X100MML (ENDOMECHANICALS) ×2 IMPLANT
TUBING INSUFFLATOR HI FLOW (MISCELLANEOUS) ×2 IMPLANT
WATER STERILE IRR 1000ML POUR (IV SOLUTION) ×2 IMPLANT

## 2017-09-21 NOTE — Op Note (Signed)
,  Preoperative diagnosis: Chronic cholecystitis and cholelithiasis.  Postoperative diagnosis: Same.  Operative procedure: Laparoscopic cholecystectomy with intraoperative cholangiograms.  Operating surgeon: Loa Socks, M.D.  Anesthesia: Gen. endotracheal.  Estimated blood loss: Less than 5 mL.  Clinical note: This 66 year old woman has had episodic epigastric and right upper quadrant pain with ultrasound showing evidence cholelithiasis.  Operative note: The patient underwent general endotracheal anesthesia without difficulty. The umbilical area was cleaned of a significant amount of residue. The abdomen was cleansed with ChloraPrep and draped. In Trendelenburg position a varies needle was placed through a trans-umbilical incision. After assuring intra-abdominal location with the hanging drop test the abdomen was insufflated with CO2 a 10 mmHg pressure. A 10 mm Step port was expanded. Inspection showed no evidence of injury from initial port placement. The patient was placed in the reverse Trendelenburg position and rolled to the left. An 11 mm XL port was placed in the epigastrium. 2-5 mm Step ports were placed in the right lateral abdominal wall under direct vision. There were extensive adhesions of the omentum to the fundus of the gallbladder and the duodenum was adherent to the neck of the gallbladder. These areas were taken down with careful cautery dissection. The cystic duct was isolated and a Kumar clamp placed. 5 mL of one half strength Conray 60 were utilized for cholangiograms. This showed prompt filling of the right and left hepatic ducts and free flow into the duodenum. No evidence of retained stones. Cystic duct and cystic artery ranches were doubly clipped and divided. The gallbladder was removed from the liver bed making use of hook cautery dissection. The gallbladder was fairly tense and it was elected to place this into an Endo Catch bag. This was brought to the umbilical site and  extracted. After reestablishing pneumoperitoneum inspection showed good hemostasis. Adhesions above the liver, Rochele Raring showed no evidence of bleeding. The right upper quadrant was irrigated with lactated Ringer solution. The abdomen was then desufflated and ports removed under direct vision.  Skin incisions were closed with 4-0 Vicryl subcuticular sutures. Benzoin, Steri-Strips, Telfa and Tegaderm dressings were applied.  The patient tolerated the procedure well and was taken recovery room in stable condition.

## 2017-09-21 NOTE — Transfer of Care (Signed)
Immediate Anesthesia Transfer of Care Note  Patient: Katelyn Friedman  Procedure(s) Performed: Procedure(s): LAPAROSCOPIC CHOLECYSTECTOMY WITH INTRAOPERATIVE CHOLANGIOGRAM (N/A)  Patient Location: PACU  Anesthesia Type:General  Level of Consciousness: awake, alert  and oriented  Airway & Oxygen Therapy: Patient Spontanous Breathing and Patient connected to face mask oxygen  Post-op Assessment: Report given to RN and Post -op Vital signs reviewed and stable  Post vital signs: Reviewed and stable  Last Vitals:  Vitals:   09/21/17 1334 09/21/17 1336  BP: (!) 147/83 (!) 147/83  Pulse: 73 79  Resp: 15 20  Temp: (!) 36 C (!) 35.8 C  SpO2: 100% 100%    Last Pain:  Vitals:   09/21/17 1336  TempSrc: Temporal         Complications: No apparent anesthesia complications

## 2017-09-21 NOTE — H&P (Signed)
Reports dark, but not black, tarry stools. Will recheck CBC. Otherwise, no change in clinical history or exam. For cholecystectomy.

## 2017-09-21 NOTE — Anesthesia Preprocedure Evaluation (Signed)
Anesthesia Evaluation  Patient identified by MRN, date of birth, ID band Patient awake    Reviewed: Allergy & Precautions, H&P , NPO status , Patient's Chart, lab work & pertinent test results, reviewed documented beta blocker date and time   History of Anesthesia Complications (+) PONV, Family history of anesthesia reaction and history of anesthetic complications  Airway Mallampati: II  TM Distance: >3 FB Neck ROM: full    Dental  (+) Teeth Intact   Pulmonary neg pulmonary ROS, asthma ,    Pulmonary exam normal        Cardiovascular negative cardio ROS Normal cardiovascular exam Rhythm:regular Rate:Normal     Neuro/Psych PSYCHIATRIC DISORDERS negative neurological ROS  negative psych ROS   GI/Hepatic negative GI ROS, Neg liver ROS, GERD  Medicated,  Endo/Other  negative endocrine ROSHyperthyroidism   Renal/GU negative Renal ROS  negative genitourinary   Musculoskeletal   Abdominal   Peds  Hematology negative hematology ROS (+)   Anesthesia Other Findings Past Medical History: No date: Allergic rhinitis No date: Arthritis No date: Asthma No date: Family history of adverse reaction to anesthesia     Comment:  PONV No date: GERD (gastroesophageal reflux disease) No date: Herpes simplex No date: History of colon polyps No date: IBS (irritable bowel syndrome) No date: PONV (postoperative nausea and vomiting) No date: Thyroid disease     Comment:  graves disease Past Surgical History: 2014: BREAST BIOPSY; Right     Comment:  negative 2006: COLONOSCOPY 1992: KNEE ARTHROSCOPY 06/08/2016: KNEE ARTHROSCOPY; Right     Comment:  Procedure: ARTHROSCOPY KNEE- partial medial minisectomy               and condroplasty;  Surgeon: Melrose Nakayama, MD;                Location: [place];  Service: Orthopedics;  Laterality:               Right; 2006: ROTATOR CUFF REPAIR     Comment:  Margaretmary Eddy  No date: TONSILLECTOMY No  date: TUBAL LIGATION   Reproductive/Obstetrics negative OB ROS                             Anesthesia Physical Anesthesia Plan  ASA: III  Anesthesia Plan: General ETT   Post-op Pain Management:    Induction:   PONV Risk Score and Plan: 4 or greater and Ondansetron, Dexamethasone, Midazolam and Propofol infusion  Airway Management Planned:   Additional Equipment:   Intra-op Plan:   Post-operative Plan:   Informed Consent: I have reviewed the patients History and Physical, chart, labs and discussed the procedure including the risks, benefits and alternatives for the proposed anesthesia with the patient or authorized representative who has indicated his/her understanding and acceptance.   Dental Advisory Given  Plan Discussed with: CRNA  Anesthesia Plan Comments:         Anesthesia Quick Evaluation

## 2017-09-21 NOTE — Anesthesia Post-op Follow-up Note (Signed)
Anesthesia QCDR form completed.        

## 2017-09-21 NOTE — Anesthesia Procedure Notes (Signed)
Procedure Name: Intubation Date/Time: 09/21/2017 12:17 PM Performed by: Silvana Newness Pre-anesthesia Checklist: Patient identified, Emergency Drugs available, Suction available, Patient being monitored and Timeout performed Patient Re-evaluated:Patient Re-evaluated prior to induction Oxygen Delivery Method: Circle system utilized Preoxygenation: Pre-oxygenation with 100% oxygen Induction Type: IV induction Ventilation: Mask ventilation without difficulty Laryngoscope Size: Mac and 3 Grade View: Grade I Tube type: Oral Tube size: 7.0 mm Number of attempts: 1 Airway Equipment and Method: Stylet Placement Confirmation: ETT inserted through vocal cords under direct vision,  positive ETCO2 and breath sounds checked- equal and bilateral Secured at: 21 cm Tube secured with: Tape Dental Injury: Teeth and Oropharynx as per pre-operative assessment

## 2017-09-21 NOTE — Discharge Instructions (Signed)

## 2017-09-22 ENCOUNTER — Encounter: Payer: Self-pay | Admitting: General Surgery

## 2017-09-22 LAB — SURGICAL PATHOLOGY

## 2017-09-22 NOTE — Anesthesia Postprocedure Evaluation (Signed)
Anesthesia Post Note  Patient: Katelyn Friedman  Procedure(s) Performed: Procedure(s) (LRB): LAPAROSCOPIC CHOLECYSTECTOMY WITH INTRAOPERATIVE CHOLANGIOGRAM (N/A)  Patient location during evaluation: PACU Anesthesia Type: General Level of consciousness: awake and alert Pain management: pain level controlled Vital Signs Assessment: post-procedure vital signs reviewed and stable Respiratory status: spontaneous breathing, nonlabored ventilation, respiratory function stable and patient connected to nasal cannula oxygen Cardiovascular status: blood pressure returned to baseline and stable Postop Assessment: no apparent nausea or vomiting Anesthetic complications: no     Last Vitals:  Vitals:   09/21/17 1535 09/21/17 1732  BP: (!) 142/72 (!) 151/77  Pulse: 64   Resp: 16   Temp: (!) 36.2 C   SpO2: 99%     Last Pain:  Vitals:   09/22/17 0825  TempSrc:   PainSc: 1                  Molli Barrows

## 2017-10-06 ENCOUNTER — Ambulatory Visit (INDEPENDENT_AMBULATORY_CARE_PROVIDER_SITE_OTHER): Payer: PPO | Admitting: General Surgery

## 2017-10-06 ENCOUNTER — Encounter: Payer: Self-pay | Admitting: General Surgery

## 2017-10-06 VITALS — BP 122/68 | HR 72 | Resp 13 | Ht 63.0 in | Wt 176.0 lb

## 2017-10-06 DIAGNOSIS — K8020 Calculus of gallbladder without cholecystitis without obstruction: Secondary | ICD-10-CM

## 2017-10-06 NOTE — Progress Notes (Signed)
Patient ID: Katelyn Friedman, female   DOB: [birth year], 66 y.o.   MRN: [id number]  Chief Complaint  Patient presents with  . Routine Post Op    HPI Katelyn Friedman is a 66 y.o. female here today for her post op cholecystectomy done on 09/21/2017. Patient states she is doing well. Moves her bowels every other day.  HPI  Past Medical History:  Diagnosis Date  . Allergic rhinitis   . Arthritis   . Asthma   . Family history of adverse reaction to anesthesia    PONV  . GERD (gastroesophageal reflux disease)   . Herpes simplex   . History of colon polyps   . IBS (irritable bowel syndrome)   . PONV (postoperative nausea and vomiting)   . Thyroid disease    graves disease    Past Surgical History:  Procedure Laterality Date  . BREAST BIOPSY Right 2014   negative  . CHOLECYSTECTOMY N/A 09/21/2017   Procedure: LAPAROSCOPIC CHOLECYSTECTOMY WITH INTRAOPERATIVE CHOLANGIOGRAM;  Surgeon: Robert Bellow, MD;  Location: [facility];  Service: General;  Laterality: N/A;  . COLONOSCOPY  2006  . KNEE ARTHROSCOPY  1992  . KNEE ARTHROSCOPY Right 06/08/2016   Procedure: ARTHROSCOPY KNEE- partial medial minisectomy and condroplasty;  Surgeon: Melrose Nakayama, MD;  Location: [place];  Service: Orthopedics;  Laterality: Right;  . ROTATOR CUFF REPAIR  2006   Margaretmary Eddy   . TONSILLECTOMY    . TUBAL LIGATION      Family History  Problem Relation Age of Onset  . Stroke Mother   . Alzheimer's disease Mother   . Hypertension Mother   . Colon polyps Maternal Aunt   . Cancer Maternal Aunt        colon  . Breast cancer Neg Hx     Social History Social History  Substance Use Topics  . Smoking status: Never Smoker  . Smokeless tobacco: Never Used  . Alcohol use No    Allergies  Allergen Reactions  . Influenza Vaccines Swelling and Other (See Comments)    Redness, Fever  . Codeine Nausea Only  . Mobic [Meloxicam] Itching and Rash    Current Outpatient Prescriptions  Medication Sig  Dispense Refill  . acetaminophen (TYLENOL) 500 MG tablet Take 500-1,000 mg by mouth every 6 (six) hours as needed (for pain/headaches.).    [name] acyclovir (ZOVIRAX) 400 MG tablet Take 1 tablet (400 mg total) by mouth daily. 90 tablet 3  . albuterol (PROAIR HFA) 108 (90 Base) MCG/ACT inhaler Inhale 2 puffs into the lungs every 6 (six) hours as needed for wheezing or shortness of breath. 1 Inhaler 1  . aspirin EC 81 MG tablet Take 81 mg by mouth every other day. In the evening.    . Cholecalciferol (VITAMIN D) 2000 units tablet Take 2,000 Units by mouth daily.     No current facility-administered medications for this visit.     Review of Systems Review of Systems  Constitutional: Negative.   Respiratory: Negative.   Cardiovascular: Negative.     Blood pressure 122/68, pulse 72, resp. rate 13, height 5\' 3"  (1.6 m), weight 176 lb (79.8 kg).  Physical Exam Physical Exam  Constitutional: She is oriented to person, place, and time. She appears well-developed and well-nourished.  Cardiovascular: Normal rate, regular rhythm and normal heart sounds.   Pulmonary/Chest: Effort normal and breath sounds normal.  Abdominal: Soft. Bowel sounds are normal. There is no tenderness.  Port sites are clean and healing well.  Neurological: She is alert and oriented to person, place, and time.  Skin: Skin is warm and dry.    Data Reviewed Pathology from the 09/21/2017 procedure-DIAGNOSIS:  A. GALLBLADDER; CHOLECYSTECTOMY:  - CHRONIC CHOLECYSTITIS WITH CHOLELITHIASIS.  - NEGATIVE FOR MALIGNANCY.    Assessment    Doing well status post cholecystectomy.     Plan        Patient to return as needed. The patient is aware to call back for any questions or concerns.   HPI, Physical Exam, Assessment and Plan have been scribed under the direction and in the presence of Hervey Ard, MD.  Katelyn Friedman, CMA  I have completed the exam and reviewed the above documentation for accuracy and  completeness.  I agree with the above.  Haematologist has been used and any errors in dictation or transcription are unintentional.  Hervey Ard, M.D., F.A.C.S.  Robert Bellow 10/06/2017, 8:20 PM

## 2017-10-06 NOTE — Patient Instructions (Addendum)
Patient to return as needed. The patient is aware to call back for any questions or concerns.d.

## 2017-10-26 ENCOUNTER — Encounter: Payer: Self-pay | Admitting: Internal Medicine

## 2017-10-26 ENCOUNTER — Ambulatory Visit (INDEPENDENT_AMBULATORY_CARE_PROVIDER_SITE_OTHER): Payer: PPO | Admitting: Internal Medicine

## 2017-10-26 ENCOUNTER — Ambulatory Visit (INDEPENDENT_AMBULATORY_CARE_PROVIDER_SITE_OTHER): Payer: PPO

## 2017-10-26 VITALS — BP 126/72 | HR 81 | Temp 98.3°F | Resp 15 | Ht 63.0 in | Wt 175.0 lb

## 2017-10-26 VITALS — BP 126/72 | HR 81 | Temp 98.3°F | Resp 14 | Ht 63.0 in | Wt 175.1 lb

## 2017-10-26 DIAGNOSIS — Z860100 Personal history of colon polyps, unspecified: Secondary | ICD-10-CM

## 2017-10-26 DIAGNOSIS — Z1239 Encounter for other screening for malignant neoplasm of breast: Secondary | ICD-10-CM

## 2017-10-26 DIAGNOSIS — F419 Anxiety disorder, unspecified: Secondary | ICD-10-CM | POA: Diagnosis not present

## 2017-10-26 DIAGNOSIS — Z9049 Acquired absence of other specified parts of digestive tract: Secondary | ICD-10-CM | POA: Diagnosis not present

## 2017-10-26 DIAGNOSIS — R5383 Other fatigue: Secondary | ICD-10-CM | POA: Diagnosis not present

## 2017-10-26 DIAGNOSIS — E0500 Thyrotoxicosis with diffuse goiter without thyrotoxic crisis or storm: Secondary | ICD-10-CM

## 2017-10-26 DIAGNOSIS — Z1231 Encounter for screening mammogram for malignant neoplasm of breast: Secondary | ICD-10-CM | POA: Diagnosis not present

## 2017-10-26 DIAGNOSIS — F5105 Insomnia due to other mental disorder: Secondary | ICD-10-CM | POA: Diagnosis not present

## 2017-10-26 DIAGNOSIS — Z0000 Encounter for general adult medical examination without abnormal findings: Secondary | ICD-10-CM | POA: Diagnosis not present

## 2017-10-26 DIAGNOSIS — R05 Cough: Secondary | ICD-10-CM

## 2017-10-26 DIAGNOSIS — R059 Cough, unspecified: Secondary | ICD-10-CM

## 2017-10-26 DIAGNOSIS — E669 Obesity, unspecified: Secondary | ICD-10-CM

## 2017-10-26 MED ORDER — ZOSTER VAC RECOMB ADJUVANTED 50 MCG/0.5ML IM SUSR: 0.5000 mL | Freq: Once | INTRAMUSCULAR | 1 refills | Status: AC

## 2017-10-26 MED ORDER — ALBUTEROL SULFATE HFA 108 (90 BASE) MCG/ACT IN AERS: 2.0000 | INHALATION_SPRAY | Freq: Four times a day (QID) | RESPIRATORY_TRACT | 1 refills | Status: DC | PRN

## 2017-10-26 NOTE — Patient Instructions (Addendum)
  Katelyn Friedman , Thank you for taking time to come for your Medicare Wellness Visit. I appreciate your ongoing commitment to your health goals. Please review the following plan we discussed and let me know if I can assist you in the future.   Follow up with Dr. Derrel Nip as needed.    Have a great day!  These are the goals we discussed: Goals    . Increase physical activity          Silver Sneaker program at the gym, 3-5 days a week, 30 minutes       This is a list of the screening recommended for you and due dates:  Health Maintenance  Topic Date Due  .  Hepatitis C: One time screening is recommended by Center for Disease Control  (CDC) for  adults born from [birth year] through 1966.   1951-06-09  . Pneumonia vaccines (1 of 2 - PCV13) 06/29/2015  . Flu Shot  07/27/2017  . Mammogram  09/21/2018  . Tetanus Vaccine  04/10/2024  . Colon Cancer Screening  05/07/2024  . DEXA scan (bone density measurement)  Completed

## 2017-10-26 NOTE — Progress Notes (Signed)
Subjective:   Katelyn Friedman is a 66 y.o. female who presents for an Initial Medicare Annual Wellness Visit.  Review of Systems    No ROS.  Medicare Wellness Visit. Additional risk factors are reflected in the social history.  Cardiac Risk Factors include: advanced age (>20men, >50 women);obesity (BMI >30kg/m2)     Objective:    Today's Vitals   10/26/17 0812  BP: 126/72  Pulse: 81  Resp: 14  Temp: 98.3 F (36.8 C)  TempSrc: Oral  SpO2: 99%  Weight: 175 lb 1.9 oz (79.4 kg)  Height: 5\' 3"  (1.6 m)   Body mass index is 31.02 kg/m.   Current Medications (verified) Outpatient Encounter Prescriptions as of 10/26/2017  Medication Sig  . acetaminophen (TYLENOL) 500 MG tablet Take 500-1,000 mg by mouth every 6 (six) hours as needed (for pain/headaches.).  [name] acyclovir (ZOVIRAX) 400 MG tablet Take 1 tablet (400 mg total) by mouth daily.  [name] albuterol (PROAIR HFA) 108 (90 Base) MCG/ACT inhaler Inhale 2 puffs into the lungs every 6 (six) hours as needed for wheezing or shortness of breath.  [name] aspirin EC 81 MG tablet Take 81 mg by mouth every other day. In the evening.  . Cholecalciferol (VITAMIN D) 2000 units tablet Take 2,000 Units by mouth daily.   No facility-administered encounter medications on file as of 10/26/2017.     Allergies (verified) Influenza vaccines; Codeine; and Mobic [meloxicam]   History: Past Medical History:  Diagnosis Date  . Allergic rhinitis   . Arthritis   . Asthma   . Family history of adverse reaction to anesthesia    PONV  . GERD (gastroesophageal reflux disease)   . Herpes simplex   . History of colon polyps   . IBS (irritable bowel syndrome)   . PONV (postoperative nausea and vomiting)   . Thyroid disease    graves disease   Past Surgical History:  Procedure Laterality Date  . BREAST BIOPSY Right 2014   negative  . CHOLECYSTECTOMY N/A 09/21/2017   Procedure: LAPAROSCOPIC CHOLECYSTECTOMY WITH INTRAOPERATIVE CHOLANGIOGRAM;  Surgeon:  Robert Bellow, MD;  Location: [facility];  Service: General;  Laterality: N/A;  . COLONOSCOPY  2006  . KNEE ARTHROSCOPY  1992  . KNEE ARTHROSCOPY Right 06/08/2016   Procedure: ARTHROSCOPY KNEE- partial medial minisectomy and condroplasty;  Surgeon: Melrose Nakayama, MD;  Location: [place];  Service: Orthopedics;  Laterality: Right;  . ROTATOR CUFF REPAIR  2006   Margaretmary Eddy   . TONSILLECTOMY    . TUBAL LIGATION     Family History  Problem Relation Age of Onset  . Stroke Mother   . Alzheimer's disease Mother   . Hypertension Mother   . Colon polyps Maternal Aunt   . Cancer Maternal Aunt        colon  . Breast cancer Neg Hx    Social History   Occupational History  . Not on file.   Social History Main Topics  . Smoking status: Never Smoker  . Smokeless tobacco: Never Used  . Alcohol use No  . Drug use: No  . Sexual activity: Yes    Partners: Male    Tobacco Counseling Counseling given: Not Answered   Activities of Daily Living In your present state of health, do you have any difficulty performing the following activities: 10/26/2017 09/13/2017  Hearing? N N  Vision? N N  Difficulty concentrating or making decisions? N N  Walking or climbing stairs? N N  Dressing or bathing? N  N  Doing errands, shopping? N N  Preparing Food and eating ? N -  Using the Toilet? N -  In the past six months, have you accidently leaked urine? N -  Do you have problems with loss of bowel control? N -  Managing your Medications? N -  Managing your Finances? N -  Housekeeping or managing your Housekeeping? N -  Some recent data might be hidden    Immunizations and Health Maintenance Immunization History  Administered Date(s) Administered  . Pneumococcal Polysaccharide-23 03/02/2013  . Tdap 04/10/2014   Health Maintenance Due  Topic Date Due  . Hepatitis C Screening  1951-12-23  . PNA vac Low Risk Adult (1 of 2 - PCV13) 06/29/2015  . INFLUENZA VACCINE  07/27/2017    Patient Care  Team: [name], MD as PCP - General (Internal Medicine) [name], MD (Internal Medicine) Bary Castilla Forest Gleason, MD (General Surgery)  Indicate any recent Medical Services you may have received from other than [facility] providers in the past year (date may be approximate).     Assessment:   This is a routine wellness examination for Katelyn Friedman. The goal of the wellness visit is to assist the patient how to close the gaps in care and create a preventative care plan for the patient.   The roster of all physicians providing medical care to patient is listed in the Snapshot section of the chart.  Taking calcium VIT D as appropriate/Osteoporosis risk reviewed.    Safety issues reviewed; Smoke and carbon monoxide detectors in the home. No firearms in the home.  Wears seatbelts when driving or riding with others. Patient does wear sunscreen or protective clothing when in direct sunlight. No violence in the home.  Patient is alert, normal appearance, oriented to person/place/and time.  Correctly identified the president of the [place], recall of 3/3 words, and performing simple calculations. Displays appropriate judgement and can read correct time from watch face.   No new identified risk were noted.  No failures at ADL's or IADL's.    BMI- discussed the importance of a healthy diet, water intake and the benefits of aerobic exercise. Educational material provided.   24 hour diet recall: Low sodium and heart healthy diet.  Recent removal of gall bladder.  Daily fluid intake: 1 cups of caffeine, 4-6 cups of water, 1 cup of juice.  Dental- coverage to begin 12/2017.  Will schedule an appointment at this time.  Sleep patterns- Sleeps 7 hours at night.  Wakes feeling rested.  Hepatitis C screening discussed; educational material provided.  Influenza and Prevnar 13 vaccine discussed; deferred for follow up with PCP, per patient request.  Patient Concerns: None at this time. Follow up  with PCP as needed.  Hearing/Vision screen Hearing Screening Comments: Patient is able to hear conversational tones without difficulty.  No issues reported.   Vision Screening Comments: Followed by [facility] Wears corrective lenses Last OV 2017 Visual acuity not assessed per patient preference since they have regular follow up with the ophthalmologist  Dietary issues and exercise activities discussed: Current Exercise Habits: Home exercise routine, Type of exercise: walking, Time (Minutes): 20, Frequency (Times/Week): 4, Weekly Exercise (Minutes/Week): 80, Intensity: Mild  Goals    . Increase physical activity          Silver Sneaker program at the gym, 3-5 days a week, 30 minutes      Depression Screen PHQ 2/9 Scores 10/26/2017 07/25/2017 12/15/2015  PHQ - 2 Score 0 4 0  PHQ- 9 Score - 16 -  Exception Documentation - - (No Data)    Fall Risk Fall Risk  10/26/2017 07/25/2017 12/15/2015  Falls in the past year? No No No    Cognitive Function: MMSE - Mini Mental State Exam 10/26/2017  Orientation to time 5  Orientation to Place 5  Registration 3  Attention/ Calculation 5  Recall 3  Language- name 2 objects 2  Language- repeat 1  Language- follow 3 step command 3  Language- read & follow direction 1  Write a sentence 1  Copy design 1  Total score 30        Screening Tests Health Maintenance  Topic Date Due  . Hepatitis C Screening  04/15/1951  . PNA vac Low Risk Adult (1 of 2 - PCV13) 06/29/2015  . INFLUENZA VACCINE  07/27/2017  . MAMMOGRAM  09/21/2018  . TETANUS/TDAP  04/10/2024  . COLONOSCOPY  05/07/2024  . DEXA SCAN  Completed      Plan:   End of life planning; Advanced aging; Advanced directives discussed.  No HCPOA/Living Will.  Additional information declined at this time.  I have personally reviewed and noted the following in the patient's chart:   . Medical and social history . Use of alcohol, tobacco or illicit drugs  . Current medications  and supplements . Functional ability and status . Nutritional status . Physical activity . Advanced directives . List of other physicians . Hospitalizations, surgeries, and ER visits in previous 12 months . Vitals . Screenings to include cognitive, depression, and falls . Referrals and appointments  In addition, I have reviewed and discussed with patient certain preventive protocols, quality metrics, and best practice recommendations. A written personalized care plan for preventive services as well as general preventive health recommendations were provided to patient.     OBrien-Blaney, Sue Fernicola L, LPN   [id number]    I have reviewed the above information and agree with above.   Deborra Medina, MD

## 2017-10-26 NOTE — Patient Instructions (Addendum)
WE WILL DO YOUR DEXA SCAN IN 2019 ( 5 YR  INTERVAL )   YOUR NEXT COLONOSCOPY IS DUE IN 2025 (TEN YEAR FOLLOW UP)  MAMMOGRAM DUE NOW and ordered   Return in a week or so for the prevnar   Send me a message  2 weeks before your cruise.  I will call in medications forL=:  Traveler's diarrhea Rotavirus Seasickness    The ShingRx vaccine is now available in local pharmacies and is much more protective thant Zostavaxs,  It is therefore ADVISED for all interested adults over 50 to prevent shingles    Health Maintenance for Postmenopausal Women Menopause is a normal process in which your reproductive ability comes to an end. This process happens gradually over a span of months to years, usually between the ages of 49 and 38. Menopause is complete when you have missed 12 consecutive menstrual periods. It is important to talk with your health care provider about some of the most common conditions that affect postmenopausal women, such as heart disease, cancer, and bone loss (osteoporosis). Adopting a healthy lifestyle and getting preventive care can help to promote your health and wellness. Those actions can also lower your chances of developing some of these common conditions. What should I know about menopause? During menopause, you may experience a number of symptoms, such as:  Moderate-to-severe hot flashes.  Night sweats.  Decrease in sex drive.  Mood swings.  Headaches.  Tiredness.  Irritability.  Memory problems.  Insomnia.  Choosing to treat or not to treat menopausal changes is an individual decision that you make with your health care provider. What should I know about hormone replacement therapy and supplements? Hormone therapy products are effective for treating symptoms that are associated with menopause, such as hot flashes and night sweats. Hormone replacement carries certain risks, especially as you become older. If you are thinking about using estrogen or  estrogen with progestin treatments, discuss the benefits and risks with your health care provider. What should I know about heart disease and stroke? Heart disease, heart attack, and stroke become more likely as you age. This may be due, in part, to the hormonal changes that your body experiences during menopause. These can affect how your body processes dietary fats, triglycerides, and cholesterol. Heart attack and stroke are both medical emergencies. There are many things that you can do to help prevent heart disease and stroke:  Have your blood pressure checked at least every 1-2 years. High blood pressure causes heart disease and increases the risk of stroke.  If you are 93-67 years old, ask your health care provider if you should take aspirin to prevent a heart attack or a stroke.  Do not use any tobacco products, including cigarettes, chewing tobacco, or electronic cigarettes. If you need help quitting, ask your health care provider.  It is important to eat a healthy diet and maintain a healthy weight. ? Be sure to include plenty of vegetables, fruits, low-fat dairy products, and lean protein. ? Avoid eating foods that are high in solid fats, added sugars, or salt (sodium).  Get regular exercise. This is one of the most important things that you can do for your health. ? Try to exercise for at least 150 minutes each week. The type of exercise that you do should increase your heart rate and make you sweat. This is known as moderate-intensity exercise. ? Try to do strengthening exercises at least twice each week. Do these in addition to the moderate-intensity exercise.  Know your numbers.Ask your health care provider to check your cholesterol and your blood glucose. Continue to have your blood tested as directed by your health care provider.  What should I know about cancer screening? There are several types of cancer. Take the following steps to reduce your risk and to catch any cancer  development as early as possible. Breast Cancer  Practice breast self-awareness. ? This means understanding how your breasts normally appear and feel. ? It also means doing regular breast self-exams. Let your health care provider know about any changes, no matter how small.  If you are 40 or older, have a clinician do a breast exam (clinical breast exam or CBE) every year. Depending on your age, family history, and medical history, it may be recommended that you also have a yearly breast X-ray (mammogram).  If you have a family history of breast cancer, talk with your health care provider about genetic screening.  If you are at high risk for breast cancer, talk with your health care provider about having an MRI and a mammogram every year.  Breast cancer (BRCA) gene test is recommended for women who have family members with BRCA-related cancers. Results of the assessment will determine the need for genetic counseling and BRCA1 and for BRCA2 testing. BRCA-related cancers include these types: ? Breast. This occurs in males or females. ? Ovarian. ? Tubal. This may also be called fallopian tube cancer. ? Cancer of the abdominal or pelvic lining (peritoneal cancer). ? Prostate. ? Pancreatic.  Cervical, Uterine, and Ovarian Cancer Your health care provider may recommend that you be screened regularly for cancer of the pelvic organs. These include your ovaries, uterus, and vagina. This screening involves a pelvic exam, which includes checking for microscopic changes to the surface of your cervix (Pap test).  For women ages 21-65, health care providers may recommend a pelvic exam and a Pap test every three years. For women ages 25-65, they may recommend the Pap test and pelvic exam, combined with testing for human papilloma virus (HPV), every five years. Some types of HPV increase your risk of cervical cancer. Testing for HPV may also be done on women of any age who have unclear Pap test  results.  Other health care providers may not recommend any screening for nonpregnant women who are considered low risk for pelvic cancer and have no symptoms. Ask your health care provider if a screening pelvic exam is right for you.  If you have had past treatment for cervical cancer or a condition that could lead to cancer, you need Pap tests and screening for cancer for at least 20 years after your treatment. If Pap tests have been discontinued for you, your risk factors (such as having a new sexual partner) need to be reassessed to determine if you should start having screenings again. Some women have medical problems that increase the chance of getting cervical cancer. In these cases, your health care provider may recommend that you have screening and Pap tests more often.  If you have a family history of uterine cancer or ovarian cancer, talk with your health care provider about genetic screening.  If you have vaginal bleeding after reaching menopause, tell your health care provider.  There are currently no reliable tests available to screen for ovarian cancer.  Lung Cancer Lung cancer screening is recommended for adults 8-11 years old who are at high risk for lung cancer because of a history of smoking. A yearly low-dose CT scan of the  lungs is recommended if you:  Currently smoke.  Have a history of at least 30 pack-years of smoking and you currently smoke or have quit within the past 15 years. A pack-year is smoking an average of one pack of cigarettes per day for one year.  Yearly screening should:  Continue until it has been 15 years since you quit.  Stop if you develop a health problem that would prevent you from having lung cancer treatment.  Colorectal Cancer  This type of cancer can be detected and can often be prevented.  Routine colorectal cancer screening usually begins at age 63 and continues through age 33.  If you have risk factors for colon cancer, your health  care provider may recommend that you be screened at an earlier age.  If you have a family history of colorectal cancer, talk with your health care provider about genetic screening.  Your health care provider may also recommend using home test kits to check for hidden blood in your stool.  A small camera at the end of a tube can be used to examine your colon directly (sigmoidoscopy or colonoscopy). This is done to check for the earliest forms of colorectal cancer.  Direct examination of the colon should be repeated every 5-10 years until age 55. However, if early forms of precancerous polyps or small growths are found or if you have a family history or genetic risk for colorectal cancer, you may need to be screened more often.  Skin Cancer  Check your skin from head to toe regularly.  Monitor any moles. Be sure to tell your health care provider: ? About any new moles or changes in moles, especially if there is a change in a mole's shape or color. ? If you have a mole that is larger than the size of a pencil eraser.  If any of your family members has a history of skin cancer, especially at a young age, talk with your health care provider about genetic screening.  Always use sunscreen. Apply sunscreen liberally and repeatedly throughout the day.  Whenever you are outside, protect yourself by wearing long sleeves, pants, a wide-brimmed hat, and sunglasses.  What should I know about osteoporosis? Osteoporosis is a condition in which bone destruction happens more quickly than new bone creation. After menopause, you may be at an increased risk for osteoporosis. To help prevent osteoporosis or the bone fractures that can happen because of osteoporosis, the following is recommended:  If you are 63-33 years old, get at least 1,000 mg of calcium and at least 600 mg of vitamin D per day.  If you are older than age 37 but younger than age 73, get at least 1,200 mg of calcium and at least 600 mg of  vitamin D per day.  If you are older than age 73, get at least 1,200 mg of calcium and at least 800 mg of vitamin D per day.  Smoking and excessive alcohol intake increase the risk of osteoporosis. Eat foods that are rich in calcium and vitamin D, and do weight-bearing exercises several times each week as directed by your health care provider. What should I know about how menopause affects my mental health? Depression may occur at any age, but it is more common as you become older. Common symptoms of depression include:  Low or sad mood.  Changes in sleep patterns.  Changes in appetite or eating patterns.  Feeling an overall lack of motivation or enjoyment of activities that you previously  enjoyed.  Frequent crying spells.  Talk with your health care provider if you think that you are experiencing depression. What should I know about immunizations? It is important that you get and maintain your immunizations. These include:  Tetanus, diphtheria, and pertussis (Tdap) booster vaccine.  Influenza every year before the flu season begins.  Pneumonia vaccine.  Shingles vaccine.  Your health care provider may also recommend other immunizations. This information is not intended to replace advice given to you by your health care provider. Make sure you discuss any questions you have with your health care provider. Document Released: 02/04/2006 Document Revised: 07/02/2016 Document Reviewed: 09/16/2015 Elsevier Interactive Patient Education  2018 [employer].

## 2017-10-26 NOTE — Progress Notes (Signed)
Patient ID: Katelyn Friedman, female    DOB: 07/11/1951  Age: 66 y.o. MRN: [id number]  The patient is here for anagement of other chronic and acute problems.  ADMITTED FOR CHEST PAIN, UNDERWENT A LAP CHOLE SEPT 26  BYRNETT   DEXA 2014: T SCORE -15 SPINE ,  REPEAT NEXT YEAR MAMMOGRAM DUE COLON  POLYP 2015 HYPERPLASTIC 10 YR FOLLOW UP  PAP NORMAL 2017 AT AGE 42     The risk factors are reflected in the social history.  The roster of all physicians providing medical care to patient - is listed in the Snapshot section of the chart.  Activities of daily living:  The patient is 100% independent in all ADLs: dressing, toileting, feeding as well as independent mobility  Home safety : The patient has smoke detectors in the home. They wear seatbelts.  There are no firearms at home. There is no violence in the home.   There is no risks for hepatitis, STDs or HIV. There is no   history of blood transfusion. They have no travel history to infectious disease endemic areas of the world.  The patient has seen their dentist in the last six month. They have seen their eye doctor in the last year. They admit to slight hearing difficulty with regard to whispered voices and some television programs.  They have deferred audiologic testing in the last year.  They do not  have excessive sun exposure. Discussed the need for sun protection: hats, long sleeves and use of sunscreen if there is significant sun exposure.   Diet: the importance of a healthy diet is discussed. They do have a healthy diet.  The benefits of regular aerobic exercise were discussed. She walks 4 times per week ,  20 minutes.   Depression screen: there are no signs or vegative symptoms of depression- irritability, change in appetite, anhedonia, sadness/tearfullness.  Cognitive assessment: the patient manages all their financial and personal affairs and is actively engaged. They could relate day,date,year and events; recalled 2/3 objects at 3  minutes; performed clock-face test normally.  The following portions of the patient's history were reviewed and updated as appropriate: allergies, current medications, past family history, past medical history,  past surgical history, past social history  and problem list.  Visual acuity was not assessed per patient preference since she has regular follow up with her ophthalmologist. Hearing and body mass index were assessed and reviewed.   During the course of the visit the patient was educated and counseled about appropriate screening and preventive services including : fall prevention , diabetes screening, nutrition counseling, colorectal cancer screening, and recommended immunizations.    CC: The primary encounter diagnosis was Breast cancer screening. Diagnoses of Cough, Fatigue, unspecified type, History of colon polyps, Obesity (BMI 30-39.9), and S/P laparoscopic cholecystectomy were also pertinent to this visit.  History Diamonds has a past medical history of Allergic rhinitis; Arthritis; Asthma; Family history of adverse reaction to anesthesia; GERD (gastroesophageal reflux disease); Herpes simplex; History of colon polyps; IBS (irritable bowel syndrome); PONV (postoperative nausea and vomiting); and Thyroid disease.   She has a past surgical history that includes Rotator cuff repair (2006); Knee arthroscopy (1992); Tubal ligation; Tonsillectomy; Colonoscopy (2006); Breast biopsy (Right, 2014); Knee arthroscopy (Right, 06/08/2016); and Cholecystectomy (N/A, 09/21/2017).   Her family history includes Alzheimer's disease in her mother; Cancer in her maternal aunt; Colon polyps in her maternal aunt; Hypertension in her mother; Stroke in her mother.She reports that she has never smoked. She has  never used smokeless tobacco. She reports that she does not drink alcohol or use drugs.  Outpatient Medications Prior to Visit  Medication Sig Dispense Refill  . acetaminophen (TYLENOL) 500 MG tablet  Take 500-1,000 mg by mouth every 6 (six) hours as needed (for pain/headaches.).    [name] acyclovir (ZOVIRAX) 400 MG tablet Take 1 tablet (400 mg total) by mouth daily. 90 tablet 3  . aspirin EC 81 MG tablet Take 81 mg by mouth every other day. In the evening.    . Cholecalciferol (VITAMIN D) 2000 units tablet Take 2,000 Units by mouth daily.    [name] albuterol (PROAIR HFA) 108 (90 Base) MCG/ACT inhaler Inhale 2 puffs into the lungs every 6 (six) hours as needed for wheezing or shortness of breath. 1 Inhaler 1   No facility-administered medications prior to visit.     Review of Systems   Patient denies headache, fevers, malaise, unintentional weight loss, skin rash, eye pain, sinus congestion and sinus pain, sore throat, dysphagia,  hemoptysis , cough, dyspnea, wheezing, chest pain, palpitations, orthopnea, edema, abdominal pain, nausea, melena, diarrhea, constipation, flank pain, dysuria, hematuria, urinary  Frequency, nocturia, numbness, tingling, seizures,  Focal weakness, Loss of consciousness,  Tremor, insomnia, depression, anxiety, and suicidal ideation.      Objective:  BP 126/72 (BP Location: Left Arm, Patient Position: Sitting, Cuff Size: Normal)   Pulse 81   Temp 98.3 F (36.8 C) (Oral)   Resp 15   Ht 5\' 3"  (1.6 m)   Wt 175 lb (79.4 kg)   SpO2 99%   BMI 31.00 kg/m   Physical Exam   General appearance: alert, cooperative and appears stated age Head: Normocephalic, without obvious abnormality, atraumatic Eyes: conjunctivae/corneas clear. PERRL, EOM's intact. Fundi benign. Ears: normal TM's and external ear canals both ears Nose: Nares normal. Septum midline. Mucosa normal. No drainage or sinus tenderness. Throat: lips, mucosa, and tongue normal; teeth and gums normal Neck: no adenopathy, no carotid bruit, no JVD, supple, symmetrical, trachea midline and thyroid not enlarged, symmetric, no tenderness/mass/nodules Lungs: clear to auscultation bilaterally Breasts: normal appearance,  no masses or tenderness Heart: regular rate and rhythm, S1, S2 normal, no murmur, click, rub or gallop Abdomen: soft, non-tender; bowel sounds normal; no masses,  no organomegaly Extremities: extremities normal, atraumatic, no cyanosis or edema Pulses: 2+ and symmetric Skin: Skin color, texture, turgor normal. No rashes or lesions Neurologic: Alert and oriented X 3, normal strength and tone. Normal symmetric reflexes. Normal coordination and gait.     Assessment & Plan:   Problem List Items Addressed This Visit    History of colon polyps    Hyperplastic, by 2015 colonoscopy.  repeat in 2025.      Obesity (BMI 30-39.9)    I have addressed  BMI and recommended wt loss of 10% of body weight over the next 6 months using a low fat, low starch, high protein  fruit/vegetable based Mediterranean diet and 30 minutes of aerobic exercise a minimum of 5 days per week.        S/P laparoscopic cholecystectomy    Done by Dr. Bary Castilla in September 2018. She has healed well and has no diarrhea.         Other Visit Diagnoses    Breast cancer screening    -  Primary   Relevant Orders   MM SCREENING BREAST TOMO BILATERAL   Cough       Relevant Medications   albuterol (PROAIR HFA) 108 (90 Base) MCG/ACT inhaler  Fatigue, unspecified type       Relevant Orders   Hepatitis C antibody (Completed)     A total of 40 minutes was spent with patient more than half of which was spent in counseling patient on the above mentioned issues , reviewing and explaining recent labs and imaging studies done, and coordination of care. I am having Ms. Pastrana start on Zoster Vaccine Adjuvanted. I am also having her maintain her aspirin EC, acyclovir, Vitamin D, acetaminophen, and albuterol.  Meds ordered this encounter  Medications  . albuterol (PROAIR HFA) 108 (90 Base) MCG/ACT inhaler    Sig: Inhale 2 puffs into the lungs every 6 (six) hours as needed for wheezing or shortness of breath.    Dispense:  1 Inhaler     Refill:  1  . Zoster Vaccine Adjuvanted [facility]) injection    Sig: Inject 0.5 mLs into the muscle once.    Dispense:  1 each    Refill:  1    Medications Discontinued During This Encounter  Medication Reason  . albuterol (PROAIR HFA) 108 (90 Base) MCG/ACT inhaler Reorder    Follow-up: Return in about 1 year (around 10/26/2018).   [name], MD

## 2017-10-27 DIAGNOSIS — Z9049 Acquired absence of other specified parts of digestive tract: Secondary | ICD-10-CM | POA: Insufficient documentation

## 2017-10-27 LAB — HEPATITIS C ANTIBODY
Hepatitis C Ab: NONREACTIVE
SIGNAL TO CUT-OFF: 0.01 (ref <1.00)

## 2017-10-27 NOTE — Assessment & Plan Note (Signed)
She remains euthyroid . TSH is normal .   No medications prescribed today.  Repeat in 6 months    Lab Results  Component Value Date   TSH 1.96 07/25/2017

## 2017-10-27 NOTE — Assessment & Plan Note (Addendum)
Done by Dr. Bary Castilla in September 2018. She has healed well and has no diarrhea.

## 2017-10-27 NOTE — Assessment & Plan Note (Signed)
She is no longer taking alprazolam prn insomnia .

## 2017-10-27 NOTE — Assessment & Plan Note (Signed)
I have addressed  BMI and recommended wt loss of 10% of body weight over the next 6 months using a low fat, low starch, high protein  fruit/vegetable based Mediterranean diet and 30 minutes of aerobic exercise a minimum of 5 days per week.   

## 2017-10-27 NOTE — Assessment & Plan Note (Signed)
Hyperplastic, by 2015 colonoscopy.  repeat in 2025.

## 2017-10-30 ENCOUNTER — Encounter: Payer: Self-pay | Admitting: Internal Medicine

## 2017-11-02 ENCOUNTER — Ambulatory Visit: Payer: PPO

## 2017-11-02 NOTE — Telephone Encounter (Unsigned)
Copied from [place] #[id number]. Topic: Quick Communication - Appointment Cancellation >> Nov 02, 2017  3:12 PM Bea Graff, NT wrote: Patient called to cancel appointment scheduled for today. Patient has rescheduled their appointment.    Route to department's PEC pool.

## 2017-11-09 ENCOUNTER — Ambulatory Visit (INDEPENDENT_AMBULATORY_CARE_PROVIDER_SITE_OTHER): Payer: PPO

## 2017-11-09 ENCOUNTER — Encounter: Payer: Self-pay | Admitting: General Surgery

## 2017-11-09 DIAGNOSIS — Z23 Encounter for immunization: Secondary | ICD-10-CM | POA: Diagnosis not present

## 2017-11-09 NOTE — Progress Notes (Signed)
Patient comes in for Prevnar injection . Injected right deltoid .  Patient tolerated injection well.

## 2017-11-14 ENCOUNTER — Encounter: Payer: Self-pay | Admitting: Internal Medicine

## 2017-11-14 NOTE — Progress Notes (Signed)
My chart message 10/30/17 mailed to patient.

## 2017-11-29 ENCOUNTER — Telehealth: Payer: Self-pay | Admitting: Internal Medicine

## 2017-11-29 MED ORDER — CIPROFLOXACIN HCL 500 MG PO TABS: 500.0000 mg | ORAL_TABLET | Freq: Two times a day (BID) | ORAL | 0 refills | Status: DC

## 2017-11-29 MED ORDER — PROMETHAZINE HCL 12.5 MG PO TABS: 12.5000 mg | ORAL_TABLET | Freq: Three times a day (TID) | ORAL | 0 refills | Status: DC | PRN

## 2017-11-29 NOTE — Telephone Encounter (Signed)
Copied from [place]. Topic: Quick Communication - See Telephone Encounter >> Nov 29, 2017  8:56 AM Aurelio Brash B wrote: CRM for notification. See Telephone encounter for:  Pt is going out of country Sunday 12/9 Pt is asking for meds for roto virus and travelers diarrhea.  [place] 11/29/17.

## 2017-11-29 NOTE — Telephone Encounter (Signed)
Please advise 

## 2017-11-29 NOTE — Telephone Encounter (Signed)
cipro and phenergan sent.  Advise her to Take immodium with her (OTC) for diarrhea

## 2017-11-29 NOTE — Telephone Encounter (Signed)
Patient aware.

## 2017-11-30 ENCOUNTER — Ambulatory Visit
Admission: RE | Admit: 2017-11-30 | Discharge: 2017-11-30 | Disposition: A | Discharge status: Home / Self Care | Payer: PPO | Source: Ambulatory Visit | Attending: Internal Medicine | Admitting: Internal Medicine

## 2017-11-30 DIAGNOSIS — Z1239 Encounter for other screening for malignant neoplasm of breast: Secondary | ICD-10-CM

## 2017-11-30 DIAGNOSIS — Z1231 Encounter for screening mammogram for malignant neoplasm of breast: Secondary | ICD-10-CM | POA: Diagnosis not present

## 2017-11-30 DIAGNOSIS — N6489 Other specified disorders of breast: Secondary | ICD-10-CM | POA: Insufficient documentation

## 2018-03-08 DIAGNOSIS — S83282A Other tear of lateral meniscus, current injury, left knee, initial encounter: Secondary | ICD-10-CM | POA: Diagnosis not present

## 2018-03-16 ENCOUNTER — Encounter: Payer: Self-pay | Admitting: Family Medicine

## 2018-03-16 ENCOUNTER — Ambulatory Visit (INDEPENDENT_AMBULATORY_CARE_PROVIDER_SITE_OTHER): Payer: PPO | Admitting: Family Medicine

## 2018-03-16 VITALS — BP 116/70 | HR 76 | Temp 98.8°F | Resp 16 | Wt 172.2 lb

## 2018-03-16 DIAGNOSIS — H1032 Unspecified acute conjunctivitis, left eye: Secondary | ICD-10-CM | POA: Diagnosis not present

## 2018-03-16 DIAGNOSIS — J0100 Acute maxillary sinusitis, unspecified: Secondary | ICD-10-CM

## 2018-03-16 DIAGNOSIS — J029 Acute pharyngitis, unspecified: Secondary | ICD-10-CM

## 2018-03-16 LAB — POCT RAPID STREP A (OFFICE): Rapid Strep A Screen: NEGATIVE

## 2018-03-16 MED ORDER — AMOXICILLIN-POT CLAVULANATE 875-125 MG PO TABS: 1.0000 | ORAL_TABLET | Freq: Two times a day (BID) | ORAL | 0 refills | Status: DC

## 2018-03-16 MED ORDER — POLYMYXIN B-TRIMETHOPRIM 10000-0.1 UNIT/ML-% OP SOLN: OPHTHALMIC | 0 refills | Status: DC

## 2018-03-16 MED ORDER — DOXYCYCLINE HYCLATE 100 MG PO TABS: 100.0000 mg | ORAL_TABLET | Freq: Two times a day (BID) | ORAL | 0 refills | Status: DC

## 2018-03-16 NOTE — Patient Instructions (Addendum)
Your symptoms are most likely related to a viral illness. Please drink plenty of water so that your urine is pale yellow or clear. If symptoms persist after conservative treatments of saline rinses and flonase, then you can initiate antibiotic therapy for sinus symptoms. Also, get plenty of rest, use tylenol  as needed for discomfort and follow up if symptoms do not improve in 3 to 4 days, worsen, or you develop a fever >101.   Bacterial Conjunctivitis Bacterial conjunctivitis is an infection of your conjunctiva. This is the clear membrane that covers the white part of your eye and the inner surface of your eyelid. This condition can make your eye:  Red or pink.  Itchy.  This condition is caused by bacteria. This condition spreads very easily from person to person (is contagious) and from one eye to the other eye. Follow these instructions at home: Medicines  Take or apply your antibiotic medicine as told by your doctor. Do not stop taking or applying the antibiotic even if you start to feel better.  Take or apply over-the-counter and prescription medicines only as told by your doctor.  Do not touch your eyelid with the eye drop bottle or the ointment tube. Managing discomfort  Wipe any fluid from your eye with a warm, wet washcloth or a cotton ball.  Place a cool, clean washcloth on your eye. Do this for 10-20 minutes, 3-4 times per day. General instructions  Do not wear contact lenses until the irritation is gone. Wear glasses until your doctor says it is okay to wear contacts.  Do not wear eye makeup until your symptoms are gone. Throw away any old makeup.  Change or wash your pillowcase every day.  Do not share towels or washcloths with anyone.  Wash your hands often with soap and water. Use paper towels to dry your hands.  Do not touch or rub your eyes.  Do not drive or use heavy machinery if your vision is blurry. Contact a doctor if:  You have a fever.  Your symptoms  do not get better after 10 days. Get help right away if:  You have a fever and your symptoms suddenly get worse.  You have very bad pain when you move your eye.  Your face: ? Hurts. ? Is red. ? Is swollen.  You have sudden loss of vision. This information is not intended to replace advice given to you by your health care provider. Make sure you discuss any questions you have with your health care provider. Document Released: 09/21/2008 Document Revised: 05/20/2016 Document Reviewed: 09/25/2015   Sinusitis, Adult Sinusitis is soreness and inflammation of your sinuses. Sinuses are hollow spaces in the bones around your face. They are located:  Around your eyes.  In the middle of your forehead.  Behind your nose.  In your cheekbones.  Your sinuses and nasal passages are lined with a stringy fluid (mucus). Mucus normally drains out of your sinuses. When your nasal tissues get inflamed or swollen, the mucus can get trapped or blocked so air cannot flow through your sinuses. This lets bacteria, viruses, and funguses grow, and that leads to infection. Follow these instructions at home: Medicines  Take, use, or apply over-the-counter and prescription medicines only as told by your doctor. These may include nasal sprays.  If you were prescribed an antibiotic medicine, take it as told by your doctor. Do not stop taking the antibiotic even if you start to feel better. Hydrate and Humidify  Drink enough  water to keep your pee (urine) clear or pale yellow.  Use a cool mist humidifier to keep the humidity level in your home above 50%.  Breathe in steam for 10-15 minutes, 3-4 times a day or as told by your doctor. You can do this in the bathroom while a hot shower is running.  Try not to spend time in cool or dry air. Rest  Rest as much as possible.  Sleep with your head raised (elevated).  Make sure to get enough sleep each night. General instructions  Put a warm, moist  washcloth on your face 3-4 times a day or as told by your doctor. This will help with discomfort.  Wash your hands often with soap and water. If there is no soap and water, use hand sanitizer.  Do not smoke. Avoid being around people who are smoking (secondhand smoke).  Keep all follow-up visits as told by your doctor. This is important. Contact a doctor if:  You have a fever.  Your symptoms get worse.  Your symptoms do not get better within 10 days. Get help right away if:  You have a very bad headache.  You cannot stop throwing up (vomiting).  You have pain or swelling around your face or eyes.  You have trouble seeing.  You feel confused.  Your neck is stiff.  You have trouble breathing. This information is not intended to replace advice given to you by your health care provider. Make sure you discuss any questions you have with your health care provider. Document Released: 05/31/2008 Document Revised: 08/08/2016 Document Reviewed: 10/08/2015 Elsevier Interactive Patient Education  2018 [employer].   Chartered certified accountant Patient Education  [employer].

## 2018-03-16 NOTE — Progress Notes (Signed)
Patient ID: Katelyn Friedman, female   DOB: [birth year], 67 y.o.   MRN: [id number]  PCP: [name], MD  Subjective:  Katelyn Friedman is a 67 y.o. year old very pleasant female patient who presents with symptoms including nasal congestion, sore throat, left eye erythema, post nasal drip, cough that is minimally productive of yellow/green sputum. Associated yellow drainage from her left eye/crusting noted in the morning. She reports drainage (yellow) during the day. Mild associated burning in left eye present. No photophobia present today.  -started: 5 days ago, symptoms are not improving  -previous treatments: throat lozenges and saline nasal spray have provided limited benefit. -sick contacts/travel/risks: denies flu exposure. 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 -Hx of: arthritis She is not a smoker No history of asthma/COPD. Reports history of reactive airway with minimal inhaler use.   ROS-denies fever, SOB, NVD, tooth pain  Pertinent Past Medical History- Graves Disease, GAD  Medications- reviewed  Current Outpatient Medications  Medication Sig Dispense Refill  . acetaminophen (TYLENOL) 500 MG tablet Take 500-1,000 mg by mouth every 6 (six) hours as needed (for pain/headaches.).    [name] acyclovir (ZOVIRAX) 400 MG tablet Take 1 tablet (400 mg total) by mouth daily. 90 tablet 3  . albuterol (PROAIR HFA) 108 (90 Base) MCG/ACT inhaler Inhale 2 puffs into the lungs every 6 (six) hours as needed for wheezing or shortness of breath. 1 Inhaler 1  . aspirin EC 81 MG tablet Take 81 mg by mouth every other day. In the evening.    . Cholecalciferol (VITAMIN D) 2000 units tablet Take 2,000 Units by mouth daily.     No current facility-administered medications for this visit.     Objective: BP 116/70 (BP Location: Left Arm, Patient Position: Sitting, Cuff Size: Large)   Pulse 76   Temp 98.8 F (37.1 C) (Oral)   Resp 16   Wt 172 lb 4 oz (78.1 kg)    SpO2 98%   BMI 30.51 kg/m  Gen: NAD, resting comfortably HEENT: Turbinates erythematous, TMs normal bilaterally, pharynx mildly erythematous with no tonsilar exudate or edema, +maxillary sinus tenderness.  Diffuse injection of the Left conjunctiva. No white spots, opacity, or foreign body appreciated. No collection of blood or pus in the anterior chamber. No ciliary flush surrounding iris. No photophobia or eye pain appreciated during exam. Yellow/green discharge present. No change in vision. CV: RRR no murmurs rubs or gallops Lungs: CTAB no crackles, wheeze, rhonchi Abdomen: soft/nontender/nondistended/normal bowel sounds. No rebound or guarding.  Ext: no edema Skin: warm, dry, no rash Neuro: grossly normal, moves all extremities  Assessment/Plan: 1. Acute conjunctivitis of left eye, unspecified acute conjunctivitis type Discharge that is present throughout the day and recent exposure to children at elementary school with similar symptoms. We discussed options and agreed to treat as she would like to return to substitute teaching and address worsening symptoms.  - trimethoprim-polymyxin b (POLYTRIM) ophthalmic solution; Two drops to left eye every 6 hours for 5 to 7 days.  Dispense: 10 mL; Refill: 0  2. Acute maxillary sinusitis, recurrence not specified URI symptoms have been present for 5 days. We discussed that this is likely viral in nature and she has agreed to try conservative measures first and if symptoms persist or worsen over the next 72 hours, she will initiate antibiotic therapy that was provided. Written prescription for doxycycline provided; considered Augmenin however patient reported that she experiences GI distress and diarrhea with this medication. Advised  patient on supportive measures:  Get rest, drink plenty of fluids, flonase, and use tylenol as needed for pain. Follow up if fever >101, or if symptoms worsen or if symptoms are not improved with treatment. Patient verbalizes  understanding.    - doxycycline (VIBRA-TABS) 100 MG tablet; Take 1 tablet (100 mg total) by mouth 2 (two) times daily.  Dispense: 20 tablet; Refill: 0  3. Pharyngitis, unspecified etiology Rapid Strep negative; advised warm salt water gargles.   We discussed that we did not find any infection that had higher probability of being bacterial such as pneumonia or strep throat. We discussed signs that bacterial infection may have developed particularly fever or shortness of breath. Likely course of 1-2 weeks. Patient is contagious and advised good handwashing and consideration of mask If going to be in public places.   Finally, we reviewed reasons to return to care including if symptoms worsen or persist or new concerns arise- once again particularly shortness of breath or fever.   [name], FNP

## 2018-04-05 ENCOUNTER — Other Ambulatory Visit: Payer: Self-pay | Admitting: Orthopedic Surgery

## 2018-04-05 DIAGNOSIS — M25562 Pain in left knee: Secondary | ICD-10-CM

## 2018-04-05 DIAGNOSIS — S83282D Other tear of lateral meniscus, current injury, left knee, subsequent encounter: Secondary | ICD-10-CM | POA: Diagnosis not present

## 2018-04-11 ENCOUNTER — Ambulatory Visit: Payer: PPO

## 2018-04-14 ENCOUNTER — Ambulatory Visit
Admission: RE | Admit: 2018-04-14 | Discharge: 2018-04-14 | Disposition: A | Discharge status: Home / Self Care | Payer: PPO | Source: Ambulatory Visit | Attending: Orthopedic Surgery | Admitting: Orthopedic Surgery

## 2018-04-14 DIAGNOSIS — M7052 Other bursitis of knee, left knee: Secondary | ICD-10-CM | POA: Diagnosis not present

## 2018-04-14 DIAGNOSIS — S83272A Complex tear of lateral meniscus, current injury, left knee, initial encounter: Secondary | ICD-10-CM | POA: Insufficient documentation

## 2018-04-14 DIAGNOSIS — M948X6 Other specified disorders of cartilage, lower leg: Secondary | ICD-10-CM | POA: Insufficient documentation

## 2018-04-14 DIAGNOSIS — M25562 Pain in left knee: Secondary | ICD-10-CM | POA: Diagnosis not present

## 2018-04-24 ENCOUNTER — Telehealth: Payer: Self-pay | Admitting: Internal Medicine

## 2018-04-24 NOTE — Telephone Encounter (Signed)
Spoke with pt and scheduled for a surgical clearance on 05/09/2018 @ 4:00pm. Pt is aware of appt date and time.

## 2018-04-24 NOTE — Telephone Encounter (Signed)
Needs appointment for pre operative evaluation for knee .  She will need a new EKG

## 2018-05-09 ENCOUNTER — Ambulatory Visit (INDEPENDENT_AMBULATORY_CARE_PROVIDER_SITE_OTHER): Payer: PPO | Admitting: Internal Medicine

## 2018-05-09 ENCOUNTER — Encounter: Payer: Self-pay | Admitting: Internal Medicine

## 2018-05-09 ENCOUNTER — Ambulatory Visit (INDEPENDENT_AMBULATORY_CARE_PROVIDER_SITE_OTHER): Payer: PPO

## 2018-05-09 VITALS — BP 110/68 | HR 59 | Temp 98.3°F | Resp 14 | Ht 63.0 in | Wt 169.6 lb

## 2018-05-09 DIAGNOSIS — Z136 Encounter for screening for cardiovascular disorders: Secondary | ICD-10-CM | POA: Diagnosis not present

## 2018-05-09 DIAGNOSIS — I517 Cardiomegaly: Secondary | ICD-10-CM | POA: Diagnosis not present

## 2018-05-09 DIAGNOSIS — Z01818 Encounter for other preprocedural examination: Secondary | ICD-10-CM

## 2018-05-09 DIAGNOSIS — E0500 Thyrotoxicosis with diffuse goiter without thyrotoxic crisis or storm: Secondary | ICD-10-CM

## 2018-05-09 NOTE — Progress Notes (Signed)
Subjective:  Patient ID: Katelyn Friedman, female    DOB: 1951-07-10  Age: 67 y.o. MRN: [id number]  CC: The primary encounter diagnosis was Preoperative clearance. Diagnoses of Graves disease and Treadmill stress test negative for angina pectoris were also pertinent to this visit.  HPI Katelyn Friedman presents for peroperative clearance.  Left knee arthrosocopy  For meinscal repair planned by Raliegh Ip, date [facility]  Patient last seen Oct 2018  For annual exam .she denies any history fo chest pain, shortness of breath,  Recent infections.  No history of diabetes or CKD. History of Grave's Disease ,  Last thyroid level was normal 9 months ago .  Has been losing weight.   No history of CAD.  Last EKG done in July 2018 during [facility] admission for chest pain.  chronic TWI in anteroseptal leads noted (unchanged since 2014). myoview was normal.  Last surgery was elective cholecystectomy in September 2018. No adverse reaction to anesthesia.    Outpatient Medications Prior to Visit  Medication Sig Dispense Refill  . acetaminophen (TYLENOL) 500 MG tablet Take 500-1,000 mg by mouth every 6 (six) hours as needed (for pain/headaches.).    [name] acyclovir (ZOVIRAX) 400 MG tablet Take 1 tablet (400 mg total) by mouth daily. 90 tablet 3  . albuterol (PROAIR HFA) 108 (90 Base) MCG/ACT inhaler Inhale 2 puffs into the lungs every 6 (six) hours as needed for wheezing or shortness of breath. 1 Inhaler 1  . Cholecalciferol (VITAMIN D) 2000 units tablet Take 2,000 Units by mouth daily.    [name] aspirin EC 81 MG tablet Take 81 mg by mouth every other day. In the evening.    [name] doxycycline (VIBRA-TABS) 100 MG tablet Take 1 tablet (100 mg total) by mouth 2 (two) times daily. (Patient not taking: Reported on 05/09/2018) 20 tablet 0  . trimethoprim-polymyxin b (POLYTRIM) ophthalmic solution Two drops to left eye every 6 hours for 5 to 7 days. (Patient not taking: Reported on 05/09/2018) 10 mL 0   No facility-administered  medications prior to visit.     Review of Systems;  Patient denies headache, fevers, malaise, unintentional weight loss, skin rash, eye pain, sinus congestion and sinus pain, sore throat, dysphagia,  hemoptysis , cough, dyspnea, wheezing, chest pain, palpitations, orthopnea, edema, abdominal pain, nausea, melena, diarrhea, constipation, flank pain, dysuria, hematuria, urinary  Frequency, nocturia, numbness, tingling, seizures,  Focal weakness, Loss of consciousness,  Tremor, insomnia, depression, anxiety, and suicidal ideation.      Objective:  BP 110/68 (BP Location: Left Arm, Patient Position: Sitting, Cuff Size: Large)   Pulse (!) 59   Temp 98.3 F (36.8 C) (Oral)   Resp 14   Ht 5\' 3"  (1.6 m)   Wt 169 lb 9.6 oz (76.9 kg)   SpO2 97%   BMI 30.04 kg/m   BP Readings from Last 3 Encounters:  05/09/18 110/68  03/16/18 116/70  10/26/17 126/72    Wt Readings from Last 3 Encounters:  05/09/18 169 lb 9.6 oz (76.9 kg)  03/16/18 172 lb 4 oz (78.1 kg)  10/26/17 175 lb (79.4 kg)    General appearance: alert, cooperative and appears stated age Ears: normal TM's and external ear canals both ears Throat: lips, mucosa, and tongue normal; teeth and gums normal Neck: no adenopathy, no carotid bruit, supple, symmetrical, trachea midline and thyroid not enlarged, symmetric, no tenderness/mass/nodules Back: symmetric, no curvature. ROM normal. No CVA tenderness. Lungs: clear to auscultation bilaterally Heart: regular rate and rhythm, S1, S2  normal, no murmur, click, rub or gallop Abdomen: soft, non-tender; bowel sounds normal; no masses,  no organomegaly Pulses: 2+ and symmetric Skin: Skin color, texture, turgor normal. No rashes or lesions Lymph nodes: Cervical, supraclavicular, and axillary nodes normal.  No results found for: HGBA1C  Lab Results  Component Value Date   CREATININE 0.83 05/09/2018   CREATININE 0.94 07/19/2017   CREATININE 0.98 07/18/2017    Lab Results  Component  Value Date   WBC 10.3 05/09/2018   HGB 13.1 05/09/2018   HCT 40.0 05/09/2018   PLT 262.0 05/09/2018   GLUCOSE 84 05/09/2018   CHOL 199 07/19/2017   TRIG 122 07/19/2017   HDL 60 07/19/2017   LDLDIRECT 151 (H) 08/25/2016   LDLCALC 115 (H) 07/19/2017   ALT 12 05/09/2018   AST 16 05/09/2018   NA 141 05/09/2018   K 4.1 05/09/2018   CL 106 05/09/2018   CREATININE 0.83 05/09/2018   BUN 16 05/09/2018   CO2 24 05/09/2018   TSH <0.01 Repeated and verified X2. (L) 05/09/2018   INR 1.0 05/09/2018    Mr Knee Left Wo Contrast  Result Date: 04/14/2018 CLINICAL DATA:  Left knee pain, exercising for 2 months EXAM: MRI OF THE LEFT KNEE WITHOUT CONTRAST TECHNIQUE: Multiplanar, multisequence MR imaging of the knee was performed. No intravenous contrast was administered. COMPARISON:  None. FINDINGS: MENISCI Medial meniscus: Horizontal tear of the anterior horn of the medial meniscus extending to the inferior articular surface. Lateral meniscus: Radial tear of the anterior horn of the lateral meniscus towards the root. Horizontal tear of the body of the lateral meniscus extending to the superior surface. LIGAMENTS Cruciates:  Intact ACL and PCL. Collaterals: Medial collateral ligament is intact. Lateral collateral ligament complex is intact. CARTILAGE Patellofemoral: Partial-thickness cartilage loss of the medial patellofemoral compartment. Medial: Partial-thickness cartilage loss of the medial femorotibial compartment. Lateral: Partial-thickness cartilage loss of lateral femorotibial compartment with more focal cartilage fissuring involving the lateral tibial plateau with subchondral cystic changes. Joint: Small joint effusion. No plical thickening. Normal Hoffa's fat. Popliteal Fossa:  No Baker cyst. Intact popliteus tendon. Extensor Mechanism: Intact quadriceps tendon. Intact patellar tendon. Intact medial patellar retinaculum. Intact lateral patellar retinaculum. Intact MPFL. Bones: No acute marrow signal  abnormality. No aggressive osseous lesion. Other: Fluid in the pes anserine bursa most consistent with pes anserine bursitis. IMPRESSION: 1. Horizontal tear of the anterior horn of the medial meniscus extending to the inferior articular surface. 2. Radial tear of the anterior horn of the lateral meniscus towards the root. Horizontal tear of the body of the lateral meniscus extending to the superior surface. 3. Pes anserine bursitis. 4. Tricompartmental cartilage abnormalities as described above. Electronically Signed   By: Kathreen Devoid   On: 04/14/2018 10:55    Assessment & Plan:   Problem List Items Addressed This Visit    Treadmill stress test negative for angina pectoris    done in July 2018 during admission for chest pain. Today's preoperative EKG is unchanged from July 2018 and she has no history of recent chest pain       Preoperative clearance - Primary    Patient was seen today.  She is cleared from a cardiac standpoint: EKG is unchanged from July 2018 and chest x ray is normal.  However, I cannot clear her  from a medical standpoint, as her Grave's Disease appears to have come out of remission and her TSH is completely suppressed.  Urgent Referral to Endocrinology has been made.  Lab Results  Component Value Date   INR 1.0 05/09/2018   INR 1.11 07/18/2017   Lab Results  Component Value Date   TSH <0.01 Repeated and verified X2. (L) 05/09/2018   Lab Results  Component Value Date   WBC 10.3 05/09/2018   HGB 13.1 05/09/2018   HCT 40.0 05/09/2018   MCV 85.0 05/09/2018   PLT 262.0 05/09/2018         Relevant Orders   EKG 12-Lead (Completed)   TSH (Completed)   CBC with Differential/Platelet (Completed)   Comprehensive metabolic panel (Completed)   DG Chest 2 View (Completed)   INR/PT (Completed)   Graves disease    Appears to have become activit again given TSH of < 0.01 .  Referral to Endocrinology for treatment advised   Lab Results  Component Value Date   TSH  <0.01 Repeated and verified X2. (L) 05/09/2018         Relevant Orders   Ambulatory referral to Endocrinology    A total of 40 minutes was spent with patient more than half of which was spent in counseling patient on the above mentioned issues , reviewing and explaining recent labs and imaging studies done, and coordination of care.   I have discontinued Zani L. Huckins's aspirin EC, trimethoprim-polymyxin b, and doxycycline. I am also having her maintain her acyclovir, Vitamin D, acetaminophen, and albuterol.  No orders of the defined types were placed in this encounter.   Medications Discontinued During This Encounter  Medication Reason  . aspirin EC 81 MG tablet Patient Preference  . doxycycline (VIBRA-TABS) 100 MG tablet Completed Course  . trimethoprim-polymyxin b (POLYTRIM) ophthalmic solution Completed Course    Follow-up: No follow-ups on file.   [name], MD

## 2018-05-09 NOTE — Patient Instructions (Signed)
The max dose of ibuprofen is 800mg   every 8 hours   max dose of tylenol  (long term) is 2000 mg  In divided  Doses   Take daily omeprazole or nexium to protect stomach from NSAID  Induced gastritis

## 2018-05-10 DIAGNOSIS — Z136 Encounter for screening for cardiovascular disorders: Secondary | ICD-10-CM | POA: Insufficient documentation

## 2018-05-10 DIAGNOSIS — Z01818 Encounter for other preprocedural examination: Secondary | ICD-10-CM | POA: Insufficient documentation

## 2018-05-10 LAB — COMPREHENSIVE METABOLIC PANEL
ALT: 12 U/L (ref 0–35)
AST: 16 U/L (ref 0–37)
Albumin: 4.2 g/dL (ref 3.5–5.2)
Alkaline Phosphatase: 53 U/L (ref 39–117)
BUN: 16 mg/dL (ref 6–23)
CO2: 24 mEq/L (ref 19–32)
Calcium: 9.6 mg/dL (ref 8.4–10.5)
Chloride: 106 mEq/L (ref 96–112)
Creatinine, Ser: 0.83 mg/dL (ref 0.40–1.20)
GFR: 87.96 mL/min (ref >60.00)
Glucose, Bld: 84 mg/dL (ref 70–99)
Potassium: 4.1 mEq/L (ref 3.5–5.1)
Sodium: 141 mEq/L (ref 135–145)
Total Bilirubin: 0.7 mg/dL (ref 0.2–1.2)
Total Protein: 7.2 g/dL (ref 6.0–8.3)

## 2018-05-10 LAB — CBC WITH DIFFERENTIAL/PLATELET
Basophils Absolute: 0.1 10*3/uL (ref 0.0–0.1)
Basophils Relative: 0.9 % (ref 0.0–3.0)
Eosinophils Absolute: 0.1 10*3/uL (ref 0.0–0.7)
Eosinophils Relative: 0.8 % (ref 0.0–5.0)
HCT: 40 % (ref 36.0–46.0)
Hemoglobin: 13.1 g/dL (ref 12.0–15.0)
Lymphocytes Relative: 31.3 % (ref 12.0–46.0)
Lymphs Abs: 3.2 10*3/uL (ref 0.7–4.0)
MCHC: 32.7 g/dL (ref 30.0–36.0)
MCV: 85 fl (ref 78.0–100.0)
Monocytes Absolute: 0.6 10*3/uL (ref 0.1–1.0)
Monocytes Relative: 5.9 % (ref 3.0–12.0)
Neutro Abs: 6.3 10*3/uL (ref 1.4–7.7)
Neutrophils Relative %: 61.1 % (ref 43.0–77.0)
Platelets: 262 10*3/uL (ref 150.0–400.0)
RBC: 4.71 Mil/uL (ref 3.87–5.11)
RDW: 16.3 % — ABNORMAL HIGH (ref 11.5–15.5)
WBC: 10.3 10*3/uL (ref 4.0–10.5)

## 2018-05-10 LAB — PROTIME-INR
INR: 1
Prothrombin Time: 10.8 s (ref 9.0–11.5)

## 2018-05-10 LAB — TSH: TSH: <0.01 u[IU]/mL — ABNORMAL LOW (ref 0.35–4.50)

## 2018-05-10 NOTE — Assessment & Plan Note (Signed)
Appears to have become activit again given TSH of < 0.01 .  Referral to Endocrinology for treatment advised   Lab Results  Component Value Date   TSH <0.01 Repeated and verified X2. (L) 05/09/2018

## 2018-05-10 NOTE — Assessment & Plan Note (Addendum)
done in July 2018 during admission for chest pain. Today's preoperative EKG is unchanged from July 2018 and she has no history of recent chest pain

## 2018-05-10 NOTE — Assessment & Plan Note (Addendum)
Patient was seen today.  She is cleared from a cardiac standpoint: EKG is unchanged from July 2018 and chest x ray is normal.  However, I cannot clear her  from a medical standpoint, as her Grave's Disease appears to have come out of remission and her TSH is completely suppressed.  Urgent Referral to Endocrinology has been made.   Lab Results  Component Value Date   INR 1.0 05/09/2018   INR 1.11 07/18/2017   Lab Results  Component Value Date   TSH <0.01 Repeated and verified X2. (L) 05/09/2018   Lab Results  Component Value Date   WBC 10.3 05/09/2018   HGB 13.1 05/09/2018   HCT 40.0 05/09/2018   MCV 85.0 05/09/2018   PLT 262.0 05/09/2018

## 2018-05-12 ENCOUNTER — Telehealth: Payer: Self-pay

## 2018-05-12 NOTE — Telephone Encounter (Signed)
Copied from [place] [phone]. Topic: Referral - Status >> May 12, 2018  3:18 PM Synthia Innocent wrote: Reason for CRM: Checking status of referral to endocrinology. Please advise

## 2018-05-12 NOTE — Telephone Encounter (Signed)
Pt is wondering about her referral to endocrinology.

## 2018-05-18 ENCOUNTER — Telehealth: Payer: Self-pay | Admitting: Internal Medicine

## 2018-05-18 NOTE — Telephone Encounter (Signed)
Please advise 

## 2018-05-18 NOTE — Telephone Encounter (Signed)
Copied from [place] [phone]. Topic: Quick Communication - Lab Results >> May 18, 2018  3:00 PM Robina Ade, Helene Kelp D wrote: Patient called and said that she has questions about her lab results she saw on mychart and would like to talk to provider or CMA about this. Please call patient back, thanks.

## 2018-05-19 NOTE — Telephone Encounter (Signed)
Left message with husband to have pt give [place] a call back. Please transfer pt to our office.

## 2018-05-25 NOTE — Telephone Encounter (Signed)
LMTCB. Please transfer pt to our office.  

## 2018-05-31 NOTE — Telephone Encounter (Signed)
Unable to reach pt. Pt has not returned phone calls.

## 2018-06-07 ENCOUNTER — Telehealth: Payer: Self-pay | Admitting: Internal Medicine

## 2018-06-07 ENCOUNTER — Telehealth: Payer: Self-pay

## 2018-06-07 NOTE — Telephone Encounter (Signed)
Last note, labs and demographic information has been faxed to Dr. Almetta Lovely office with attn to [employer] at [phone]

## 2018-06-07 NOTE — Telephone Encounter (Signed)
Copied from [place] [phone]. Topic: General - Other >> Jun 07, 2018  4:11 PM Carolyn Stare wrote:  Kieth Brightly with [facility] Dr Chalmers Cater office call to say pt call for an appt. Kieth Brightly said office notes, demographic,labs,insurance information need to be faxed over them so that pt can be seen 06/08/18  Attn [employer]  fax number [phone]

## 2018-06-08 DIAGNOSIS — E0500 Thyrotoxicosis with diffuse goiter without thyrotoxic crisis or storm: Secondary | ICD-10-CM | POA: Diagnosis not present

## 2018-07-05 ENCOUNTER — Telehealth: Payer: Self-pay

## 2018-07-05 NOTE — Telephone Encounter (Signed)
Copied from [place] [phone]. Topic: General - Other >> Jul 05, 2018 10:06 AM Judyann Munson wrote: Reason for CRM: Patient is requesting a call in regards to lab work please advise

## 2018-07-06 DIAGNOSIS — E0500 Thyrotoxicosis with diffuse goiter without thyrotoxic crisis or storm: Secondary | ICD-10-CM | POA: Diagnosis not present

## 2018-07-07 NOTE — Telephone Encounter (Signed)
Left message to return call, ok for pec to speak to patient and get more detail

## 2018-07-13 ENCOUNTER — Other Ambulatory Visit (HOSPITAL_COMMUNITY): Payer: Self-pay | Admitting: Endocrinology

## 2018-07-13 DIAGNOSIS — E0500 Thyrotoxicosis with diffuse goiter without thyrotoxic crisis or storm: Secondary | ICD-10-CM

## 2018-07-25 NOTE — Telephone Encounter (Signed)
Attempted to call pt several times with no return phone calls.

## 2018-08-07 ENCOUNTER — Encounter (HOSPITAL_COMMUNITY)
Admission: RE | Admit: 2018-08-07 | Discharge: 2018-08-07 | Disposition: A | Discharge status: Home / Self Care | Payer: PPO | Source: Ambulatory Visit | Attending: Endocrinology | Admitting: Endocrinology

## 2018-08-07 ENCOUNTER — Encounter (HOSPITAL_COMMUNITY): Payer: PPO

## 2018-08-07 ENCOUNTER — Encounter (HOSPITAL_COMMUNITY): Payer: Self-pay

## 2018-08-07 DIAGNOSIS — E0500 Thyrotoxicosis with diffuse goiter without thyrotoxic crisis or storm: Secondary | ICD-10-CM

## 2018-08-08 ENCOUNTER — Encounter (HOSPITAL_COMMUNITY): Payer: PPO

## 2018-08-10 ENCOUNTER — Encounter (HOSPITAL_COMMUNITY): Payer: PPO

## 2018-08-11 ENCOUNTER — Encounter (HOSPITAL_COMMUNITY): Payer: PPO

## 2018-08-22 ENCOUNTER — Encounter
Admission: RE | Admit: 2018-08-22 | Discharge: 2018-08-22 | Disposition: A | Discharge status: Home / Self Care | Payer: PPO | Source: Ambulatory Visit | Attending: Endocrinology | Admitting: Endocrinology

## 2018-08-22 ENCOUNTER — Ambulatory Visit
Admission: RE | Admit: 2018-08-22 | Discharge: 2018-08-22 | Disposition: A | Discharge status: Home / Self Care | Payer: PPO | Source: Ambulatory Visit | Attending: Endocrinology | Admitting: Endocrinology

## 2018-08-22 DIAGNOSIS — E0500 Thyrotoxicosis with diffuse goiter without thyrotoxic crisis or storm: Secondary | ICD-10-CM | POA: Diagnosis not present

## 2018-08-22 MED ORDER — SODIUM IODIDE I-123 7.4 MBQ CAPS
147.0000 | ORAL_CAPSULE | Freq: Once | ORAL | Status: AC
  Administered 2018-08-22: 147 via ORAL

## 2018-08-23 ENCOUNTER — Other Ambulatory Visit: Payer: Self-pay | Admitting: Endocrinology

## 2018-08-23 ENCOUNTER — Ambulatory Visit
Admission: RE | Admit: 2018-08-23 | Discharge: 2018-08-23 | Disposition: A | Discharge status: Home / Self Care | Payer: PPO | Source: Ambulatory Visit | Attending: Endocrinology | Admitting: Endocrinology

## 2018-08-23 ENCOUNTER — Encounter: Admission: RE | Admit: 2018-08-23 | Payer: PPO | Source: Ambulatory Visit

## 2018-08-23 ENCOUNTER — Other Ambulatory Visit: Payer: PPO

## 2018-08-23 DIAGNOSIS — E0500 Thyrotoxicosis with diffuse goiter without thyrotoxic crisis or storm: Secondary | ICD-10-CM

## 2018-08-23 MED ORDER — SODIUM IODIDE I-123 7.4 MBQ CAPS
147.0000 | ORAL_CAPSULE | Freq: Once | ORAL | Status: AC
  Administered 2018-08-22: 147 via ORAL

## 2018-08-24 ENCOUNTER — Encounter
Admission: RE | Admit: 2018-08-24 | Discharge: 2018-08-24 | Disposition: A | Discharge status: Home / Self Care | Payer: PPO | Source: Ambulatory Visit | Attending: Endocrinology | Admitting: Endocrinology

## 2018-08-24 DIAGNOSIS — E0500 Thyrotoxicosis with diffuse goiter without thyrotoxic crisis or storm: Secondary | ICD-10-CM

## 2018-08-31 ENCOUNTER — Other Ambulatory Visit: Payer: Self-pay | Admitting: Endocrinology

## 2018-08-31 DIAGNOSIS — E0500 Thyrotoxicosis with diffuse goiter without thyrotoxic crisis or storm: Secondary | ICD-10-CM

## 2018-09-08 ENCOUNTER — Encounter
Admission: RE | Admit: 2018-09-08 | Discharge: 2018-09-08 | Disposition: A | Discharge status: Home / Self Care | Payer: PPO | Source: Ambulatory Visit | Attending: Endocrinology | Admitting: Endocrinology

## 2018-09-08 DIAGNOSIS — E0500 Thyrotoxicosis with diffuse goiter without thyrotoxic crisis or storm: Secondary | ICD-10-CM | POA: Insufficient documentation

## 2018-09-08 MED ORDER — SODIUM IODIDE I 131 CAPSULE
12.6900 | Freq: Once | INTRAVENOUS | Status: AC | PRN
  Administered 2018-09-08: 12.69 via ORAL

## 2018-09-13 ENCOUNTER — Telehealth: Payer: Self-pay | Admitting: Internal Medicine

## 2018-09-13 NOTE — Telephone Encounter (Signed)
Left message for patient to return call to office need more information ans to Lodine being to high.

## 2018-09-13 NOTE — Telephone Encounter (Signed)
Juliann Pulse, can you please call to get a little more detail before we send to [facility]?  Copied from [place] [phone]. Topic: General - Other >> Sep 13, 2018 12:42 PM Yvette Rack wrote: Reason for CRM: pt calling wanting to speak with [place] about her Iodine being high please call her at [phone]

## 2018-09-13 NOTE — Telephone Encounter (Signed)
° ° °  Pt return your call but will cancel the appt for Friday until she speaks with Dr Chalmers Cater.No longer need you to return her call she will call back and schedule an appt when all is straighten out.

## 2018-09-15 ENCOUNTER — Ambulatory Visit: Payer: PPO | Admitting: Internal Medicine

## 2018-09-27 DIAGNOSIS — E0500 Thyrotoxicosis with diffuse goiter without thyrotoxic crisis or storm: Secondary | ICD-10-CM | POA: Diagnosis not present

## 2018-10-03 DIAGNOSIS — E0500 Thyrotoxicosis with diffuse goiter without thyrotoxic crisis or storm: Secondary | ICD-10-CM | POA: Diagnosis not present

## 2018-10-03 DIAGNOSIS — Z23 Encounter for immunization: Secondary | ICD-10-CM | POA: Diagnosis not present

## 2018-10-26 ENCOUNTER — Encounter: Payer: Self-pay | Admitting: Internal Medicine

## 2018-10-26 ENCOUNTER — Ambulatory Visit (INDEPENDENT_AMBULATORY_CARE_PROVIDER_SITE_OTHER): Payer: PPO | Admitting: Internal Medicine

## 2018-10-26 VITALS — BP 120/74 | HR 55 | Temp 98.3°F | Ht 63.0 in | Wt 163.6 lb

## 2018-10-26 DIAGNOSIS — R05 Cough: Secondary | ICD-10-CM

## 2018-10-26 DIAGNOSIS — J9801 Acute bronchospasm: Secondary | ICD-10-CM | POA: Diagnosis not present

## 2018-10-26 DIAGNOSIS — R059 Cough, unspecified: Secondary | ICD-10-CM

## 2018-10-26 MED ORDER — HYDROCOD POLST-CPM POLST ER 10-8 MG/5ML PO SUER: 2.5000 mL | Freq: Every evening | ORAL | 0 refills | Status: DC | PRN

## 2018-10-26 MED ORDER — ALBUTEROL SULFATE (2.5 MG/3ML) 0.083% IN NEBU
2.5000 mg | INHALATION_SOLUTION | Freq: Once | RESPIRATORY_TRACT | Status: AC
  Administered 2018-10-26: 2.5 mg via RESPIRATORY_TRACT

## 2018-10-26 MED ORDER — ALBUTEROL SULFATE HFA 108 (90 BASE) MCG/ACT IN AERS: 1.0000 | INHALATION_SPRAY | Freq: Four times a day (QID) | RESPIRATORY_TRACT | 2 refills | Status: DC | PRN

## 2018-10-26 MED ORDER — IPRATROPIUM BROMIDE 0.02 % IN SOLN
0.5000 mg | Freq: Once | RESPIRATORY_TRACT | Status: AC
  Administered 2018-10-26: 0.5 mg via RESPIRATORY_TRACT

## 2018-10-26 NOTE — Progress Notes (Signed)
Chief Complaint  Patient presents with  . Cough   F/u  1. Since Sunday had deep cough with yellow phlegm no fever or body aches tried coricidan w/o help FH asthma mom, sister, nieces and nephews but she has never had asthma been a smoker exposed 2nd hand smoke when husband used to smoke   Review of Systems  Constitutional: Negative for chills and fever.  Respiratory: Positive for cough, sputum production, shortness of breath and wheezing.   Musculoskeletal: Negative for myalgias.   Past Medical History:  Diagnosis Date  . Allergic rhinitis   . Arthritis   . Family history of adverse reaction to anesthesia    PONV  . GERD (gastroesophageal reflux disease)   . Herpes simplex   . History of colon polyps   . IBS (irritable bowel syndrome)   . PONV (postoperative nausea and vomiting)   . Thyroid disease    graves disease   Past Surgical History:  Procedure Laterality Date  . BREAST BIOPSY Right 2014   negative  . CHOLECYSTECTOMY N/A 09/21/2017   Procedure: LAPAROSCOPIC CHOLECYSTECTOMY WITH INTRAOPERATIVE CHOLANGIOGRAM;  Surgeon: Robert Bellow, MD;  Location: [facility];  Service: General;  Laterality: N/A;  . COLONOSCOPY  05/07/2014   5 mm hyperplastic polyp removed from the rectosigmoid.  Maternal aunt with history colon cancer.  10-year follow-up planned.  [name] KNEE ARTHROSCOPY  1992  . KNEE ARTHROSCOPY Right 06/08/2016   Procedure: ARTHROSCOPY KNEE- partial medial minisectomy and condroplasty;  Surgeon: Melrose Nakayama, MD;  Location: [facility];  Service: Orthopedics;  Laterality: Right;  . ROTATOR CUFF REPAIR  2006   Margaretmary Eddy   . TONSILLECTOMY    . TUBAL LIGATION     Family History  Problem Relation Age of Onset  . Stroke Mother   . Alzheimer's disease Mother   . Hypertension Mother   . Colon polyps Maternal Aunt   . Cancer Maternal Aunt        colon  . Breast cancer Neg Hx    Social History   Socioeconomic History  . Marital status: Married    Spouse name: Not on  file  . Number of children: Not on file  . Years of education: Not on file  . Highest education level: Not on file  Occupational History  . Not on file  Social Needs  . Financial resource strain: Not on file  . Food insecurity:    Worry: Not on file    Inability: Not on file  . Transportation needs:    Medical: Not on file    Non-medical: Not on file  Tobacco Use  . Smoking status: Never Smoker  . Smokeless tobacco: Never Used  Substance and Sexual Activity  . Alcohol use: No    Alcohol/week: 0.0 standard drinks  . Drug use: No  . Sexual activity: Yes    Partners: Male  Lifestyle  . Physical activity:    Days per week: Not on file    Minutes per session: Not on file  . Stress: Not on file  Relationships  . Social connections:    Talks on phone: Not on file    Gets together: Not on file    Attends religious service: Not on file    Active member of club or organization: Not on file    Attends meetings of clubs or organizations: Not on file    Relationship status: Not on file  . Intimate partner violence:    Fear of current or ex  partner: Not on file    Emotionally abused: Not on file    Physically abused: Not on file    Forced sexual activity: Not on file  Other Topics Concern  . Not on file  Social History Narrative   Married: 19 yrs   Regular exercise: walk   Caffeine use: none   Current Meds  Medication Sig  . acetaminophen (TYLENOL) 500 MG tablet Take 500-1,000 mg by mouth every 6 (six) hours as needed (for pain/headaches.).  [name] acyclovir (ZOVIRAX) 400 MG tablet Take 1 tablet (400 mg total) by mouth daily.  [name] albuterol (PROAIR HFA) 108 (90 Base) MCG/ACT inhaler Inhale 1-2 puffs into the lungs every 6 (six) hours as needed for wheezing or shortness of breath.  . Cholecalciferol (VITAMIN D) 2000 units tablet Take 2,000 Units by mouth daily.  . [DISCONTINUED] albuterol (PROAIR HFA) 108 (90 Base) MCG/ACT inhaler Inhale 2 puffs into the lungs every 6 (six) hours as  needed for wheezing or shortness of breath.   Allergies  Allergen Reactions  . Influenza Vaccines Swelling and Other (See Comments)    Redness, Fever  . Amoxicillin Diarrhea  . Codeine Nausea Only  . Mobic [Meloxicam] Itching and Rash   No results found for this or any previous visit (from the past 2160 hour(s)). Objective  Body mass index is 28.98 kg/m. Wt Readings from Last 3 Encounters:  10/26/18 163 lb 9.6 oz (74.2 kg)  05/09/18 169 lb 9.6 oz (76.9 kg)  03/16/18 172 lb 4 oz (78.1 kg)   Temp Readings from Last 3 Encounters:  10/26/18 98.3 F (36.8 C) (Oral)  05/09/18 98.3 F (36.8 C) (Oral)  03/16/18 98.8 F (37.1 C) (Oral)   BP Readings from Last 3 Encounters:  10/26/18 120/74  05/09/18 110/68  03/16/18 116/70   Pulse Readings from Last 3 Encounters:  10/26/18 (!) 55  05/09/18 (!) 59  03/16/18 76    Physical Exam  Constitutional: She is oriented to person, place, and time. Vital signs are normal. She appears well-developed and well-nourished. She is cooperative.  HENT:  Head: Normocephalic and atraumatic.  Mouth/Throat: Oropharynx is clear and moist and mucous membranes are normal.  Eyes: Pupils are equal, round, and reactive to light. Conjunctivae are normal.  Cardiovascular: Normal rate, regular rhythm and normal heart sounds.  Pulmonary/Chest: Effort normal and breath sounds normal. She has no wheezes.  Neurological: She is alert and oriented to person, place, and time. Gait normal.  Skin: Skin is warm, dry and intact.  Psychiatric: She has a normal mood and affect. Her speech is normal and behavior is normal. Judgment and thought content normal. Cognition and memory are normal.  Vitals reviewed.   Assessment   1. URI likely with bronchospasm Plan  1. Prn Albuterol inhaler  Given duoneb x 1 today  Prn Tussionex 2.5 mg qhs prn  rec otc robitussion dm  If not better call back  Provider: Dr. Olivia Mackie McLean-Scocuzza-Internal Medicine

## 2018-10-26 NOTE — Patient Instructions (Signed)
Try Robitussin DM and albuterol inhaler during the day  Tussionex 2.5 ml at night  Call back if not better    Upper Respiratory Infection, Adult Most upper respiratory infections (URIs) are caused by a virus. A URI affects the nose, throat, and upper air passages. The most common type of URI is often called "the common cold." Follow these instructions at home:  Take medicines only as told by your doctor.  Gargle warm saltwater or take cough drops to comfort your throat as told by your doctor.  Use a warm mist humidifier or inhale steam from a shower to increase air moisture. This may make it easier to breathe.  Drink enough fluid to keep your pee (urine) clear or pale yellow.  Eat soups and other clear broths.  Have a healthy diet.  Rest as needed.  Go back to work when your fever is gone or your doctor says it is okay. ? You may need to stay home longer to avoid giving your URI to others. ? You can also wear a face mask and wash your hands often to prevent spread of the virus.  Use your inhaler more if you have asthma.  Do not use any tobacco products, including cigarettes, chewing tobacco, or electronic cigarettes. If you need help quitting, ask your doctor. Contact a doctor if:  You are getting worse, not better.  Your symptoms are not helped by medicine.  You have chills.  You are getting more short of breath.  You have brown or red mucus.  You have yellow or brown discharge from your nose.  You have pain in your face, especially when you bend forward.  You have a fever.  You have puffy (swollen) neck glands.  You have pain while swallowing.  You have white areas in the back of your throat. Get help right away if:  You have very bad or constant: ? Headache. ? Ear pain. ? Pain in your forehead, behind your eyes, and over your cheekbones (sinus pain). ? Chest pain.  You have long-lasting (chronic) lung disease and any of the  following: ? Wheezing. ? Long-lasting cough. ? Coughing up blood. ? A change in your usual mucus.  You have a stiff neck.  You have changes in your: ? Vision. ? Hearing. ? Thinking. ? Mood. This information is not intended to replace advice given to you by your health care provider. Make sure you discuss any questions you have with your health care provider. Document Released: 05/31/2008 Document Revised: 08/15/2016 Document Reviewed: 03/20/2014 Elsevier Interactive Patient Education  2018 [employer].     Bronchospasm, Adult Bronchospasm is a tightening of the airways going into the lungs. During an episode, it may be harder to breathe. You may cough, and you may make a whistling sound when you breathe (wheeze). This condition often affects people with asthma. What are the causes? This condition is caused by swelling and irritation in the airways. It can be triggered by:  An infection (common).  Seasonal allergies.  An allergic reaction.  Exercise.  Irritants. These include pollution, cigarette smoke, strong odors, aerosol sprays, and paint fumes.  Weather changes. Winds increase molds and pollens in the air. Cold air may cause swelling.  Stress and emotional upset.  What are the signs or symptoms? Symptoms of this condition include:  Wheezing. If the episode was triggered by an allergy, wheezing may start right away or hours later.  Nighttime coughing.  Frequent or severe coughing with a simple cold.  Chest tightness.  Shortness of breath.  Decreased ability to exercise.  How is this diagnosed? This condition is usually diagnosed with a review of your medical history and a physical exam. Tests, such as lung function tests, are sometimes done to look for other conditions. The need for a chest X-ray depends on where the wheezing occurs and whether it is the first time you have wheezed. How is this treated? This condition may be treated with:  Inhaled  medicines. These open up the airways and help you breathe. They can be taken with an inhaler or a nebulizer device.  Corticosteroid medicines. These may be given for severe bronchospasm, usually when it is associated with asthma.  Avoiding triggers, such as irritants, infection, or allergies.  Follow these instructions at home: Medicines  Take over-the-counter and prescription medicines only as told by your health care provider.  If you need to use an inhaler or nebulizer to take your medicine, ask your health care provider to explain how to use it correctly. If you were given a spacer, always use it with your inhaler. Lifestyle  Reduce the number of triggers in your home. To do this: ? Change your heating and air conditioning filter at least once a month. ? Limit your use of fireplaces and wood stoves. ? Do not smoke. Do not allow smoking in your home. ? Avoid using perfumes and fragrances. ? Get rid of pests, such as roaches and mice, and their droppings. ? Remove any mold from your home. ? Keep your house clean and dust free. Use unscented cleaning products. ? Replace carpet with wood, tile, or vinyl flooring. Carpet can trap dander and dust. ? Use allergy-proof pillows, mattress covers, and box spring covers. ? Wash bed sheets and blankets every week in hot water. Dry them in a dryer. ? Use blankets that are made of polyester or cotton. ? Wash your hands often. ? Do not allow pets in your bedroom.  Avoid breathing in cold air when you exercise. General instructions  Have a plan for seeking medical care. Know when to call your health care provider and local emergency services, and where to get emergency care.  Stay up to date on your immunizations.  When you have an episode of bronchospasm, stay calm. Try to relax and breathe more slowly.  If you have asthma, make sure you have an asthma action plan.  Keep all follow-up visits as told by your health care provider. This is  important. Contact a health care provider if:  You have muscle aches.  You have chest pain.  The mucus that you cough up (sputum) changes from clear or white to yellow, green, gray, or bloody.  You have a fever.  Your sputum gets thicker. Get help right away if:  Your wheezing and coughing get worse, even after you take your prescribed medicines.  It gets even harder to breathe.  You develop severe chest pain. Summary  Bronchospasm is a tightening of the airways going into the lungs.  During an episode of bronchospasm, you may have a harder time breathing. You may cough and make a whistling sound when you breathe (wheeze).  Avoid exposure to triggers such as smoke, dust, mold, animal dander, and fragrances.  When you have an episode of bronchospasm, stay calm. Try to relax and breathe more slowly. This information is not intended to replace advice given to you by your health care provider. Make sure you discuss any questions you have with your health care  provider. Document Released: 12/16/2003 Document Revised: 12/09/2016 Document Reviewed: 12/09/2016 Elsevier Interactive Patient Education  2017 [employer].

## 2018-10-26 NOTE — Progress Notes (Signed)
Pre visit review using our clinic review tool, if applicable. No additional management support is needed unless otherwise documented below in the visit note. 

## 2018-10-27 ENCOUNTER — Ambulatory Visit: Payer: PPO

## 2018-10-27 ENCOUNTER — Encounter: Payer: PPO | Admitting: Internal Medicine

## 2018-12-04 DIAGNOSIS — E0500 Thyrotoxicosis with diffuse goiter without thyrotoxic crisis or storm: Secondary | ICD-10-CM | POA: Diagnosis not present

## 2018-12-07 ENCOUNTER — Ambulatory Visit: Payer: PPO

## 2018-12-07 ENCOUNTER — Encounter: Payer: PPO | Admitting: Internal Medicine

## 2018-12-18 ENCOUNTER — Ambulatory Visit (INDEPENDENT_AMBULATORY_CARE_PROVIDER_SITE_OTHER): Payer: PPO

## 2018-12-18 VITALS — BP 122/82 | HR 66 | Temp 98.1°F | Resp 14 | Ht 63.0 in | Wt 179.4 lb

## 2018-12-18 DIAGNOSIS — Z0000 Encounter for general adult medical examination without abnormal findings: Secondary | ICD-10-CM

## 2018-12-18 NOTE — Patient Instructions (Addendum)
  Katelyn Friedman , Thank you for taking time to come for your Medicare Wellness Visit. I appreciate your ongoing commitment to your health goals. Please review the following plan we discussed and let me know if I can assist you in the future.   Schedule eye exam.  These are the goals we discussed: Goals      Patient Stated   .  Maintain Healthy Lifestyle (pt-stated)     Meniscus surgery after thyroid levels are stable.        This is a list of the screening recommended for you and due dates:  Health Maintenance  Topic Date Due  . Pneumonia vaccines (2 of 2 - PPSV23) 11/09/2018  . Mammogram  12/01/2019  . Tetanus Vaccine  04/10/2024  . Colon Cancer Screening  05/07/2024  . Flu Shot  Completed  . DEXA scan (bone density measurement)  Completed  .  Hepatitis C: One time screening is recommended by Center for Disease Control  (CDC) for  adults born from [birth year] through 1966.   Completed

## 2018-12-18 NOTE — Progress Notes (Signed)
Subjective:   Katelyn Friedman is a 67 y.o. female who presents for Medicare Annual (Subsequent) preventive examination.  Review of Systems:  No ROS.  Medicare Wellness Visit. Additional risk factors are reflected in the social history. Cardiac Risk Factors include: advanced age (>51men, >67 women)     Objective:     Vitals: BP 122/82 (BP Location: Left Arm, Patient Position: Sitting, Cuff Size: Normal)   Pulse 66   Temp 98.1 F (36.7 C) (Oral)   Resp 14   Ht 5\' 3"  (1.6 m)   Wt 179 lb 6.4 oz (81.4 kg)   SpO2 98%   BMI 31.78 kg/m   Body mass index is 31.78 kg/m.  Advanced Directives 12/18/2018 10/26/2017 09/21/2017 09/13/2017 07/18/2017 07/18/2017 06/08/2016  Does Patient Have a Medical Advance Directive? Yes No No No No No No  Type of Advance Directive Katelyn Friedman  Does patient want to make changes to medical advance directive? No - Patient declined - - - - - -  Copy of [place] in Chart? No - copy requested - - - - - -  Would patient like information on creating a medical advance directive? - No - Patient declined No - Patient declined No - Patient declined Yes (Inpatient - patient requests chaplain consult to create a medical advance directive) No - Patient declined No - patient declined information    Tobacco Social History   Tobacco Use  Smoking Status Never Smoker  Smokeless Tobacco Never Used     Counseling given: Not Answered   Clinical Intake:  Pre-visit preparation completed: Yes  Pain : No/denies pain     Diabetes: No  How often do you need to have someone help you when you read instructions, pamphlets, or other written materials from your doctor or pharmacy?: 1 - Never  Interpreter Needed?: No     Past Medical History:  Diagnosis Date  . Allergic rhinitis   . Arthritis   . Family history of adverse reaction to anesthesia    PONV  . GERD (gastroesophageal reflux disease)   . Herpes simplex   .  History of colon polyps   . IBS (irritable bowel syndrome)   . PONV (postoperative nausea and vomiting)   . Thyroid disease    graves disease   Past Surgical History:  Procedure Laterality Date  . BREAST BIOPSY Right 2014   negative  . CHOLECYSTECTOMY N/A 09/21/2017   Procedure: LAPAROSCOPIC CHOLECYSTECTOMY WITH INTRAOPERATIVE CHOLANGIOGRAM;  Surgeon: Katelyn Bellow, MD;  Location: [facility];  Service: General;  Laterality: N/A;  . COLONOSCOPY  05/07/2014   5 mm hyperplastic polyp removed from the rectosigmoid.  Katelyn Friedman with history colon cancer.  10-year follow-up planned.  [name] KNEE ARTHROSCOPY  1992  . KNEE ARTHROSCOPY Right 06/08/2016   Procedure: ARTHROSCOPY KNEE- partial medial minisectomy and condroplasty;  Surgeon: Katelyn Nakayama, MD;  Location: [facility];  Service: Orthopedics;  Laterality: Right;  . ROTATOR CUFF REPAIR  2006   Katelyn Friedman   . TONSILLECTOMY    . TUBAL LIGATION     Family History  Problem Relation Age of Onset  . Stroke Mother   . Alzheimer's disease Mother   . Hypertension Mother   . Colon polyps Katelyn Friedman   . Cancer Katelyn Friedman        colon  . Breast cancer Neg Hx    Social History   Socioeconomic History  . Marital status:  Married    Spouse name: Not on file  . Number of children: Not on file  . Years of education: Not on file  . Highest education level: Not on file  Occupational History  . Not on file  Social Needs  . Financial resource strain: Not hard at all  . Food insecurity:    Worry: Never true    Inability: Never true  . Transportation needs:    Medical: No    Non-medical: No  Tobacco Use  . Smoking status: Never Smoker  . Smokeless tobacco: Never Used  Substance and Sexual Activity  . Alcohol use: No    Alcohol/week: 0.0 standard drinks  . Drug use: No  . Sexual activity: Yes    Partners: Male  Lifestyle  . Physical activity:    Days per week: Not on file    Minutes per session: Not on file  . Stress: Not at all   Relationships  . Social connections:    Talks on phone: Not on file    Gets together: Not on file    Attends religious service: Not on file    Active member of club or organization: Not on file    Attends meetings of clubs or organizations: Not on file    Relationship status: Not on file  Other Topics Concern  . Not on file  Social History Narrative   Married: 19 yrs   Regular exercise: walk   Caffeine use: none    Outpatient Encounter Medications as of 12/18/2018  Medication Sig  . acetaminophen (TYLENOL) 500 MG tablet Take 500-1,000 mg by mouth every 6 (six) hours as needed (for pain/headaches.).  [name] acyclovir (ZOVIRAX) 400 MG tablet Take 1 tablet (400 mg total) by mouth daily.  [name] albuterol (PROAIR HFA) 108 (90 Base) MCG/ACT inhaler Inhale 1-2 puffs into the lungs every 6 (six) hours as needed for wheezing or shortness of breath.  . chlorpheniramine-HYDROcodone (TUSSIONEX PENNKINETIC ER) 10-8 MG/5ML SUER Take 2.5 mLs by mouth at bedtime as needed for cough.  . Cholecalciferol (VITAMIN D) 2000 units tablet Take 2,000 Units by mouth daily.   No facility-administered encounter medications on file as of 12/18/2018.     Activities of Daily Living In your present state of health, do you have any difficulty performing the following activities: 12/18/2018  Hearing? N  Vision? N  Difficulty concentrating or making decisions? N  Walking or climbing stairs? Y  Dressing or bathing? N  Doing errands, shopping? N  Preparing Food and eating ? N  Using the Toilet? N  In the past six months, have you accidently leaked urine? Y  Comment Managed with liner as needed.  Do you have problems with loss of bowel control? N  Managing your Medications? N  Managing your Finances? N  Housekeeping or managing your Housekeeping? N  Some recent data might be hidden    Patient Care Team: Katelyn Mc, MD as PCP - General (Internal Medicine) Katelyn Mc, MD (Internal Medicine) Katelyn Friedman  Katelyn Gleason, MD (General Surgery)    Assessment:   This is a routine wellness examination for Katelyn Friedman.  Notes recent lab corp results and RAI therapy report to be sent to provider   Health Screenings  Mammogram-12/01/17 Colonoscopy-05/07/14 Katelyn Friedman reported Hearing-passes the whisper test Glucose-07/19/17 (100) Cholesterol-07/19/17 (199) Dental-UTD Vision-every 12 months  Social  Alcohol intake-no Smoking history- no Smokers in home?none Illicit drug use?none Exercise-walking Diet-regular Sexually Active-yes Multiple Partners-none  Safety  Patient feels safe at home.  Patient does have smoke detectors at home  Patient does wear sunscreen or protective clothing when in direct sunlight  Patient does wear seat belt when driving or riding with others.   Activities of Daily Living Patient can do their own household chores. Denies needing assistance with: driving, feeding themselves, getting from bed to chair, getting to the toilet, bathing/showering, dressing, managing money, climbing flight of stairs, or preparing meals.   Depression Screen Patient denies losing interest in daily life, feeling hopeless, or crying easily over simple problems.   Fall Screen Patient denies being afraid of falling or falling in the last year.    Memory Screen Patient denies problems with memory, misplacing items, and is able to balance checkbook/bank accounts.  Patient is alert, normal appearance, oriented to person/place/and time. Correctly identified the president of the [place], recall of 3/3 objects, and performing simple calculations.  Patient displays appropriate judgement and can read correct time from watch face.   Immunizations The following Immunizations are up to date: Influenza, pneumonia, and tetanus. Shingles discussed, she plans to check with her local pharmacy for cost.   Other Providers Patient Care Team: Katelyn Mc, MD as PCP - General (Internal Medicine) Katelyn Mc, MD (Internal Medicine) Katelyn Friedman Katelyn Gleason, MD (General Surgery)  Exercise Activities and Dietary recommendations Current Exercise Habits: Home exercise routine, Type of exercise: walking;stretching, Time (Minutes): 20, Frequency (Times/Week): 2, Weekly Exercise (Minutes/Week): 40, Intensity: Mild  Goals      Patient Stated   .  Maintain Healthy Lifestyle (pt-stated)     Meniscus surgery after thyroid levels are stable.        Fall Risk Fall Risk  10/26/2018 10/26/2017 07/25/2017 12/15/2015  Falls in the past year? No No No No   Depression Screen PHQ 2/9 Scores 10/26/2018 10/26/2017 07/25/2017 12/15/2015  PHQ - 2 Score 0 0 4 0  PHQ- 9 Score - - 16 -  Exception Documentation - - - (No Data)     Cognitive Function MMSE - Mini Mental State Exam 12/18/2018 10/26/2017  Orientation to time 5 5  Orientation to Place 5 5  Registration 3 3  Attention/ Calculation 5 5  Recall 3 3  Language- name 2 objects 2 2  Language- repeat 1 1  Language- follow 3 step command 3 3  Language- read & follow direction 1 1  Write a sentence 1 1  Copy design 1 1  Total score 30 30        Immunization History  Administered Date(s) Administered  . Influenza, High Dose Seasonal PF 09/26/2018  . Pneumococcal Conjugate-13 11/09/2017  . Pneumococcal Polysaccharide-23 03/02/2013  . Tdap 04/10/2014   Screening Tests Health Maintenance  Topic Date Due  . PNA vac Low Risk Adult (2 of 2 - PPSV23) 11/09/2018  . MAMMOGRAM  12/01/2019  . TETANUS/TDAP  04/10/2024  . COLONOSCOPY  05/07/2024  . INFLUENZA VACCINE  Completed  . DEXA SCAN  Completed  . Hepatitis C Screening  Completed      Plan:    End of life planning; Advance aging; Advanced directives discussed. Copy of current HCPOA/Living Will requested.    I have personally reviewed and noted the following in the patient's chart:   . Medical and social history . Use of alcohol, tobacco or illicit drugs  . Current medications and  supplements . Functional ability and status . Nutritional status . Physical activity . Advanced directives . List of other physicians . Hospitalizations, surgeries, and ER visits in previous  12 months . Vitals . Screenings to include cognitive, depression, and falls . Referrals and appointments  In addition, I have reviewed and discussed with patient certain preventive protocols, quality metrics, and best practice recommendations. A written personalized care plan for preventive services as well as general preventive health recommendations were provided to patient.     Varney Biles, LPN  11/73/[id number]   Reviewed above information.  Agree with assessment and plan.    Dr Nicki Reaper

## 2019-01-30 ENCOUNTER — Encounter: Payer: Self-pay | Admitting: Internal Medicine

## 2019-01-30 ENCOUNTER — Ambulatory Visit (INDEPENDENT_AMBULATORY_CARE_PROVIDER_SITE_OTHER): Payer: PPO | Admitting: Internal Medicine

## 2019-01-30 ENCOUNTER — Telehealth: Payer: Self-pay | Admitting: Internal Medicine

## 2019-01-30 VITALS — BP 120/72 | HR 69 | Temp 98.1°F | Resp 15 | Ht 63.0 in | Wt 172.8 lb

## 2019-01-30 DIAGNOSIS — K915 Postcholecystectomy syndrome: Secondary | ICD-10-CM

## 2019-01-30 DIAGNOSIS — R1031 Right lower quadrant pain: Secondary | ICD-10-CM | POA: Diagnosis not present

## 2019-01-30 DIAGNOSIS — B9789 Other viral agents as the cause of diseases classified elsewhere: Secondary | ICD-10-CM | POA: Diagnosis not present

## 2019-01-30 DIAGNOSIS — E0500 Thyrotoxicosis with diffuse goiter without thyrotoxic crisis or storm: Secondary | ICD-10-CM

## 2019-01-30 DIAGNOSIS — E669 Obesity, unspecified: Secondary | ICD-10-CM | POA: Diagnosis not present

## 2019-01-30 DIAGNOSIS — Z860100 Personal history of colon polyps, unspecified: Secondary | ICD-10-CM

## 2019-01-30 DIAGNOSIS — J069 Acute upper respiratory infection, unspecified: Secondary | ICD-10-CM | POA: Diagnosis not present

## 2019-01-30 DIAGNOSIS — F411 Generalized anxiety disorder: Secondary | ICD-10-CM | POA: Diagnosis not present

## 2019-01-30 DIAGNOSIS — Z23 Encounter for immunization: Secondary | ICD-10-CM | POA: Diagnosis not present

## 2019-01-30 MED ORDER — AZITHROMYCIN 250 MG PO TABS: ORAL_TABLET | ORAL | 0 refills | Status: DC

## 2019-01-30 MED ORDER — ACYCLOVIR 400 MG PO TABS: 400.0000 mg | ORAL_TABLET | Freq: Every day | ORAL | 3 refills | Status: DC

## 2019-01-30 MED ORDER — CHOLESTYRAMINE 4 G PO PACK: 4.0000 g | PACK | Freq: Three times a day (TID) | ORAL | 12 refills | Status: DC

## 2019-01-30 MED ORDER — PREDNISONE 10 MG PO TABS: ORAL_TABLET | ORAL | 0 refills | Status: DC

## 2019-01-30 MED ORDER — BENZONATATE 200 MG PO CAPS: 200.0000 mg | ORAL_CAPSULE | Freq: Two times a day (BID) | ORAL | 0 refills | Status: DC | PRN

## 2019-01-30 NOTE — Telephone Encounter (Signed)
Pt wanted to follow up on medication acyclovir (ZOVIRAX) 400 MG tablet pt states it was discussed.   Pharmacy is [facility], [place]   Please advise and Thank you!

## 2019-01-30 NOTE — Progress Notes (Signed)
Patient ID: Katelyn Friedman, female    DOB: Nov 02, 1951  Age: 68 y.o. MRN: [id number]  The patient is here for management of other chronic and acute problems.   The risk factors are reflected in the social history.  The roster of all physicians providing medical care to patient - is listed in the Snapshot section of the chart.  Activities of daily living:  The patient is 100% independent in all ADLs: dressing, toileting, feeding as well as independent mobility  Home safety : The patient has smoke detectors in the home. They wear seatbelts.  There are no firearms at home. There is no violence in the home.   There is no risks for hepatitis, STDs or HIV. There is no   history of blood transfusion. They have no travel history to infectious disease endemic areas of the world.  The patient has seen their dentist in the last six month. They have seen their eye doctor in the last year. They admit to slight hearing difficulty with regard to whispered voices and some television programs.  They have deferred audiologic testing in the last year.  They do not  have excessive sun exposure. Discussed the need for sun protection: hats, long sleeves and use of sunscreen if there is significant sun exposure.   Diet: the importance of a healthy diet is discussed. They do have a healthy diet.  The benefits of regular aerobic exercise were discussed. She walks 4 times per week ,  20 minutes.   Depression screen: there are no signs or vegative symptoms of depression- irritability, change in appetite, anhedonia, sadness/tearfullness.  Cognitive assessment: the patient manages all their financial and personal affairs and is actively engaged. They could relate day,date,year and events; recalled 2/3 objects at 3 minutes; performed clock-face test normally.  The following portions of the patient's history were reviewed and updated as appropriate: allergies, current medications, past family history, past medical history,   past surgical history, past social history  and problem list.  Visual acuity was not assessed per patient preference since she has regular follow up with her ophthalmologist. Hearing and body mass index were assessed and reviewed.   During the course of the visit the patient was educated and counseled about appropriate screening and preventive services including : fall prevention , diabetes screening, nutrition counseling, colorectal cancer screening, and recommended immunizations.    CC: The primary encounter diagnosis was Graves disease. Diagnoses of Obesity (BMI 30-39.9), Need for 23-polyvalent pneumococcal polysaccharide vaccine, Viral URI with cough, Post-cholecystectomy syndrome, Right inguinal pain, History of colon polyps, and Generalized anxiety disorder were also pertinent to this visit.   1) intermittent gnawing pain  In right inguinal area for the past several months .  Not related to moving bowels or positioning including sitting up in bed,  Etc.  Lasts for a few hours,  Then resolves spontaneously. Occurs every few weeks .  No urinary changes .    Described as an ache.  Had cholecystectomy   1.5 years ago; since then her stools have been soft enough to result in occasional fecal incontinence . Denies bloating .   Does not eat greasy or fried foods.  Still has all of her female organs.    2) Sinus drainage and congestion with cough productive of scant  sputum and headaches for the last  3 days.  Patient  Has not  used any otc  meds  except advil and tylenol along with otc cough suppressants  History Katelyn Friedman has a past medical history of Allergic rhinitis, Arthritis, Family history of adverse reaction to anesthesia, GERD (gastroesophageal reflux disease), Herpes simplex, History of colon polyps, IBS (irritable bowel syndrome), PONV (postoperative nausea and vomiting), and Thyroid disease.   She has a past surgical history that includes Rotator cuff repair (2006); Knee arthroscopy  (1992); Tubal ligation; Tonsillectomy; Colonoscopy (05/07/2014); Breast biopsy (Right, 2014); Knee arthroscopy (Right, 06/08/2016); and Cholecystectomy (N/A, 09/21/2017).   Her family history includes Alzheimer's disease in her mother; Cancer in her maternal aunt; Colon polyps in her maternal aunt; Hypertension in her mother; Stroke in her mother.She reports that she has never smoked. She has never used smokeless tobacco. She reports that she does not drink alcohol or use drugs.  Outpatient Medications Prior to Visit  Medication Sig Dispense Refill  . acetaminophen (TYLENOL) 500 MG tablet Take 500-1,000 mg by mouth every 6 (six) hours as needed (for pain/headaches.).    [name] albuterol (PROAIR HFA) 108 (90 Base) MCG/ACT inhaler Inhale 1-2 puffs into the lungs every 6 (six) hours as needed for wheezing or shortness of breath. 1 Inhaler 2  . Cholecalciferol (VITAMIN D) 2000 units tablet Take 2,000 Units by mouth daily.    [name] acyclovir (ZOVIRAX) 400 MG tablet Take 1 tablet (400 mg total) by mouth daily. 90 tablet 3  . levothyroxine (SYNTHROID, LEVOTHROID) 100 MCG tablet TAKE 1 TABLET BY MOUTH ONCE DAILY IN THE MORNING ON AN EMPTY STOMACH FOR 30 DAYS    . chlorpheniramine-HYDROcodone (TUSSIONEX PENNKINETIC ER) 10-8 MG/5ML SUER Take 2.5 mLs by mouth at bedtime as needed for cough. (Patient not taking: Reported on 01/30/2019) 115 mL 0   No facility-administered medications prior to visit.     Review of Systems   Patient denies headache, fevers, malaise, unintentional weight loss, skin rash, eye pain, sinus congestion and sinus pain, , dysphagia,  hemoptysis ,  dyspnea, wheezing, chest pain, palpitations, orthopnea, edema, abdominal pain, nausea, melena, diarrhea, constipation, flank pain, dysuria, hematuria, urinary  Frequency, nocturia, numbness, tingling, seizures,  Focal weakness, Loss of consciousness,  Tremor, insomnia, depression, anxiety, and suicidal ideation.      Objective:  BP 120/72 (BP Location:  Left Arm, Patient Position: Sitting, Cuff Size: Large)   Pulse 69   Temp 98.1 F (36.7 C) (Oral)   Resp 15   Ht 5\' 3"  (1.6 m)   Wt 172 lb 12.8 oz (78.4 kg)   SpO2 99%   BMI 30.61 kg/m   Physical Exam   General appearance: alert, cooperative and appears stated age Head: Normocephalic, without obvious abnormality, atraumatic Eyes: conjunctivae/corneas clear. PERRL, EOM's intact. Fundi benign. Ears: normal TM's and external ear canals both ears Nose: Nares normal. Septum midline. Mucosa normal. No drainage or sinus tenderness. Throat: lips, mucosa, and tongue normal; teeth and gums normal Neck: no adenopathy, no carotid bruit, no JVD, supple, symmetrical, trachea midline and thyroid not enlarged, symmetric, no tenderness/mass/nodules Lungs: clear to auscultation bilaterally Breasts: normal appearance, no masses or tenderness Heart: regular rate and rhythm, S1, S2 normal, no murmur, click, rub or gallop Abdomen: soft, non-tender; bowel sounds normal; no masses,  no organomegaly Extremities: extremities normal, atraumatic, no cyanosis or edema Pulses: 2+ and symmetric Skin: Skin color, texture, turgor normal. No rashes or lesions Neurologic: Alert and oriented X 3, normal strength and tone. Normal symmetric reflexes. Normal coordination and gait.      Assessment & Plan:   Problem List Items Addressed This Visit    Viral URI with cough  Relevant Medications   azithromycin (ZITHROMAX) 250 MG tablet   acyclovir (ZOVIRAX) 400 MG tablet   Right inguinal pain    Intermittent and mild with a normal abdominal exam and no history of bloating.  Symptoms last for several hours , resolve spontaneously and do not occur regularly .  Etiology unclear. She has a history of IBS,  Is up to date on colonoscopy, and had a CT of abdomen 18 months ago prior to her cholecysectomy.       Post-cholecystectomy syndrome    Trial of Questran for soft stools resulting in fecal incontinence      Obesity  (BMI 30-39.9)   Relevant Orders   Comprehensive metabolic panel (Completed)   Lipid panel (Completed)   History of colon polyps   Graves disease - Primary    Treated,  Now on levothyroxine.  tsh is suppressed on 100 mcg daily. Reducing dose  to 88 mcg   Lab Results  Component Value Date   TSH 0.05 (L) 01/30/2019         Relevant Medications   levothyroxine (SYNTHROID, LEVOTHROID) 88 MCG tablet   Other Relevant Orders   TSH (Completed)   CBC with Differential/Platelet (Completed)   Generalized anxiety disorder    She has stopped taking lexapro and denies any current symptoms.        Other Visit Diagnoses    Need for 23-polyvalent pneumococcal polysaccharide vaccine       Relevant Orders   Pneumococcal polysaccharide vaccine 23-valent greater than or equal to 2yo subcutaneous/IM (Completed)    A total of 25 minutes of face to face time was spent with patient more than half of which was spent in counselling about the above mentioned conditions  and coordination of care   I have discontinued Delvina L. Schoolfield's chlorpheniramine-HYDROcodone. I have also changed her levothyroxine. Additionally, I am having her start on cholestyramine, predniSONE, benzonatate, and azithromycin. Lastly, I am having her maintain her Vitamin D, acetaminophen, albuterol, and acyclovir.  Meds ordered this encounter  Medications  . cholestyramine (QUESTRAN) 4 g packet    Sig: Take 1 packet (4 g total) by mouth 3 (three) times daily with meals.    Dispense:  60 each    Refill:  12  . predniSONE (DELTASONE) 10 MG tablet    Sig: 6 tablets on Day 1 , then reduce by 1 tablet daily until gone    Dispense:  21 tablet    Refill:  0  . benzonatate (TESSALON) 200 MG capsule    Sig: Take 1 capsule (200 mg total) by mouth 2 (two) times daily as needed for cough.    Dispense:  20 capsule    Refill:  0  . azithromycin (ZITHROMAX) 250 MG tablet    Sig: 2 tablets one day 1,  One tablet daily until gone     Dispense:  6 tablet    Refill:  0  . acyclovir (ZOVIRAX) 400 MG tablet    Sig: Take 1 tablet (400 mg total) by mouth daily.    Dispense:  90 tablet    Refill:  3  . levothyroxine (SYNTHROID, LEVOTHROID) 88 MCG tablet    Sig: Take 1 tablet (88 mcg total) by mouth daily before breakfast.    Dispense:  90 tablet    Refill:  1    Medications Discontinued During This Encounter  Medication Reason  . chlorpheniramine-HYDROcodone (TUSSIONEX PENNKINETIC ER) 10-8 MG/5ML SUER Patient has not taken in last 30 days  .  acyclovir (ZOVIRAX) 400 MG tablet Reorder  . levothyroxine (SYNTHROID, LEVOTHROID) 100 MCG tablet     Follow-up: No follow-ups on file.   [name], MD

## 2019-01-30 NOTE — Patient Instructions (Addendum)
You have a viral  Syndrome .  The post nasal drip is causing your sore throat and cough .  Flush your sinuses once  daily with  NeilMed's sinus rinse or saline .  Prednisone taper for the inflammation .   6 tablets all at once on Day 1,  Then taper by 1 tablet daily until gone   Use benadryl 25 mg at bedtime  for the post nasal drip and Afrin nasal spray every 12 hours as needed for the congestion.  Gargle with salt water as needed for the sore throat.  Tessalon cough capsules for the cough  If you develop T > 100.4,  Green nasal discharge,  Or facial pain,  Start the azithromycin  antibiotic    Please take a probiotic ( Align, Floraque or Culturelle), the generic version of one of these over the counter medications, or a PROBIOTIC BEVERAGE  (kombucha,  [place] ) Yogurt, or another dietary source) for a minimum of 3 weeks to prevent a serious antibiotic associated diarrhea  Called clostridium dificile colitis.  Taking a probiotic may also prevent vaginitis due to yeast infections and can be continued indefinitely if you feel that it improves your digestion or your elimination (bowels)..    I recommend getting your Mammogram in 3-4 weeks

## 2019-01-31 ENCOUNTER — Encounter: Payer: Self-pay | Admitting: Internal Medicine

## 2019-01-31 DIAGNOSIS — J069 Acute upper respiratory infection, unspecified: Secondary | ICD-10-CM | POA: Insufficient documentation

## 2019-01-31 DIAGNOSIS — K915 Postcholecystectomy syndrome: Secondary | ICD-10-CM | POA: Insufficient documentation

## 2019-01-31 DIAGNOSIS — B9789 Other viral agents as the cause of diseases classified elsewhere: Secondary | ICD-10-CM

## 2019-01-31 DIAGNOSIS — R1031 Right lower quadrant pain: Secondary | ICD-10-CM | POA: Insufficient documentation

## 2019-01-31 LAB — COMPREHENSIVE METABOLIC PANEL
ALT: 11 U/L (ref 0–35)
AST: 17 U/L (ref 0–37)
Albumin: 4.3 g/dL (ref 3.5–5.2)
Alkaline Phosphatase: 58 U/L (ref 39–117)
BUN: 16 mg/dL (ref 6–23)
CO2: 22 mEq/L (ref 19–32)
Calcium: 9.7 mg/dL (ref 8.4–10.5)
Chloride: 104 mEq/L (ref 96–112)
Creatinine, Ser: 0.92 mg/dL (ref 0.40–1.20)
GFR: 73.32 mL/min (ref >60.00)
Glucose, Bld: 75 mg/dL (ref 70–99)
Potassium: 4.4 mEq/L (ref 3.5–5.1)
Sodium: 141 mEq/L (ref 135–145)
Total Bilirubin: 0.7 mg/dL (ref 0.2–1.2)
Total Protein: 7.2 g/dL (ref 6.0–8.3)

## 2019-01-31 LAB — LIPID PANEL
Cholesterol: 166 mg/dL (ref 0–200)
HDL: 51.4 mg/dL (ref >39.00)
LDL Cholesterol: 102 mg/dL — ABNORMAL HIGH (ref 0–99)
NonHDL: 114.57
Total CHOL/HDL Ratio: 3
Triglycerides: 63 mg/dL (ref 0.0–149.0)
VLDL: 12.6 mg/dL (ref 0.0–40.0)

## 2019-01-31 LAB — CBC WITH DIFFERENTIAL/PLATELET
Basophils Absolute: 0.1 10*3/uL (ref 0.0–0.1)
Basophils Relative: 1.1 % (ref 0.0–3.0)
Eosinophils Absolute: 0.1 10*3/uL (ref 0.0–0.7)
Eosinophils Relative: 1 % (ref 0.0–5.0)
HCT: 39.4 % (ref 36.0–46.0)
Hemoglobin: 12.9 g/dL (ref 12.0–15.0)
Lymphocytes Relative: 22.3 % (ref 12.0–46.0)
Lymphs Abs: 1.5 10*3/uL (ref 0.7–4.0)
MCHC: 32.8 g/dL (ref 30.0–36.0)
MCV: 88.2 fl (ref 78.0–100.0)
Monocytes Absolute: 0.6 10*3/uL (ref 0.1–1.0)
Monocytes Relative: 8.8 % (ref 3.0–12.0)
Neutro Abs: 4.5 10*3/uL (ref 1.4–7.7)
Neutrophils Relative %: 66.8 % (ref 43.0–77.0)
Platelets: 242 10*3/uL (ref 150.0–400.0)
RBC: 4.46 Mil/uL (ref 3.87–5.11)
RDW: 17.1 % — ABNORMAL HIGH (ref 11.5–15.5)
WBC: 6.7 10*3/uL (ref 4.0–10.5)

## 2019-01-31 LAB — TSH: TSH: 0.05 u[IU]/mL — ABNORMAL LOW (ref 0.35–4.50)

## 2019-01-31 MED ORDER — LEVOTHYROXINE SODIUM 88 MCG PO TABS: 88.0000 ug | ORAL_TABLET | Freq: Every day | ORAL | 1 refills | Status: DC

## 2019-01-31 NOTE — Assessment & Plan Note (Signed)
Trial of Questran for soft stools resulting in fecal incontinence

## 2019-01-31 NOTE — Telephone Encounter (Signed)
Medication was sent in yesterday

## 2019-01-31 NOTE — Assessment & Plan Note (Signed)
She has stopped taking lexapro and denies any current symptoms.

## 2019-01-31 NOTE — Assessment & Plan Note (Addendum)
Intermittent and mild with a normal abdominal exam and no history of bloating.  Symptoms last for several hours , resolve spontaneously and do not occur regularly .  Etiology unclear. She has a history of IBS,  Is up to date on colonoscopy, and had a CT of abdomen 18 months ago prior to her cholecysectomy.

## 2019-01-31 NOTE — Assessment & Plan Note (Signed)
Treated,  Now on levothyroxine.  tsh is suppressed on 100 mcg daily. Reducing dose  to 88 mcg   Lab Results  Component Value Date   TSH 0.05 (L) 01/30/2019

## 2019-02-05 ENCOUNTER — Telehealth: Payer: Self-pay | Admitting: Internal Medicine

## 2019-02-05 DIAGNOSIS — E890 Postprocedural hypothyroidism: Secondary | ICD-10-CM | POA: Diagnosis not present

## 2019-02-05 NOTE — Telephone Encounter (Signed)
Result has been faxed per pt request and pt is aware.

## 2019-02-05 NOTE — Telephone Encounter (Signed)
Copied from [place] [phone]. Topic: Quick Communication - See Telephone Encounter >> Feb 05, 2019 11:09 AM Blase Mess A wrote: CRM for notification. See Telephone encounter for: 02/05/19.  Patient is calling to see if her results from her Thyroid testing can be faxed [phone] [place]

## 2019-03-19 DIAGNOSIS — E890 Postprocedural hypothyroidism: Secondary | ICD-10-CM | POA: Diagnosis not present

## 2019-05-04 ENCOUNTER — Encounter: Payer: Self-pay | Admitting: Family Medicine

## 2019-05-04 ENCOUNTER — Ambulatory Visit (INDEPENDENT_AMBULATORY_CARE_PROVIDER_SITE_OTHER): Payer: PPO | Admitting: Family Medicine

## 2019-05-04 ENCOUNTER — Other Ambulatory Visit: Payer: Self-pay

## 2019-05-04 DIAGNOSIS — M79601 Pain in right arm: Secondary | ICD-10-CM

## 2019-05-04 DIAGNOSIS — R202 Paresthesia of skin: Secondary | ICD-10-CM

## 2019-05-04 DIAGNOSIS — R200 Anesthesia of skin: Secondary | ICD-10-CM | POA: Diagnosis not present

## 2019-05-04 NOTE — Progress Notes (Signed)
Patient ID: Katelyn Friedman, female   DOB: [birth year], 68 y.o.   MRN: [id number]    Virtual Visit via Video Note  This visit type was conducted due to national recommendations for restrictions regarding the COVID-19 pandemic (e.g. social distancing).  This format is felt to be most appropriate for this patient at this time.  All issues noted in this document were discussed and addressed.  No physical exam was performed (except for noted visual exam findings with Video Visits).   I connected with Sanyah today at 10:20 AM EDT by a video enabled telemedicine application and verified that I am speaking with the correct person using two identifiers. Location patient: home Location provider: [facility] Persons participating in the virtual visit: patient, provider  I discussed the limitations, risks, security and privacy concerns of performing an evaluation and management service by video and the availability of in person appointments. I also discussed with the patient that there may be a patient responsible charge related to this service. The patient expressed understanding and agreed to proceed.   HPI:  Patient and I connected via video due to pain in right arm and numbness tingling right arm that comes and goes over the past week or so.  Patient denies any new injury to her right arm.  States she was concerned about possible cardiac issues due to pain in her right arm.  Denies pain in center of chest, palpitations, shortness breath or wheezing.  Denies feeling faint or dizzy.  Denies sweating.  States sometimes she will notice her grip is not as strong in the right arm when having to grasp something like a coffee cup or twist lid off of a drawer.  States that she takes Tylenol but the pain will improve, also pain will improve if she shakes out her hand or wiggles her fingers to "wake up hand".  No fever or chills. No cough or wheezing. No GI or GU issues.   Does have hx of right shoulder  surgery to repair rotator cuff.    ROS: See pertinent positives and negatives per HPI.  Past Medical History:  Diagnosis Date  . Allergic rhinitis   . Arthritis   . Family history of adverse reaction to anesthesia    PONV  . GERD (gastroesophageal reflux disease)   . Herpes simplex   . History of colon polyps   . IBS (irritable bowel syndrome)   . PONV (postoperative nausea and vomiting)   . Thyroid disease    graves disease    Past Surgical History:  Procedure Laterality Date  . BREAST BIOPSY Right 2014   negative  . CHOLECYSTECTOMY N/A 09/21/2017   Procedure: LAPAROSCOPIC CHOLECYSTECTOMY WITH INTRAOPERATIVE CHOLANGIOGRAM;  Surgeon: Robert Bellow, MD;  Location: [facility];  Service: General;  Laterality: N/A;  . COLONOSCOPY  05/07/2014   5 mm hyperplastic polyp removed from the rectosigmoid.  Maternal aunt with history colon cancer.  10-year follow-up planned.  [name] KNEE ARTHROSCOPY  1992  . KNEE ARTHROSCOPY Right 06/08/2016   Procedure: ARTHROSCOPY KNEE- partial medial minisectomy and condroplasty;  Surgeon: Melrose Nakayama, MD;  Location: [place];  Service: Orthopedics;  Laterality: Right;  . ROTATOR CUFF REPAIR  2006   Margaretmary Eddy   . TONSILLECTOMY    . TUBAL LIGATION      Family History  Problem Relation Age of Onset  . Stroke Mother   . Alzheimer's disease Mother   . Hypertension Mother   . Colon polyps Maternal Aunt   .  Cancer Maternal Aunt        colon  . Breast cancer Neg Hx    Social History   Tobacco Use  . Smoking status: Never Smoker  . Smokeless tobacco: Never Used  Substance Use Topics  . Alcohol use: No    Alcohol/week: 0.0 standard drinks    Current Outpatient Medications:  .  acetaminophen (TYLENOL) 500 MG tablet, Take 500-1,000 mg by mouth every 6 (six) hours as needed (for pain/headaches.)., Disp: , Rfl:  .  acyclovir (ZOVIRAX) 400 MG tablet, Take 1 tablet (400 mg total) by mouth daily., Disp: 90 tablet, Rfl: 3 .  albuterol (PROAIR HFA) 108  (90 Base) MCG/ACT inhaler, Inhale 1-2 puffs into the lungs every 6 (six) hours as needed for wheezing or shortness of breath., Disp: 1 Inhaler, Rfl: 2 .  azithromycin (ZITHROMAX) 250 MG tablet, 2 tablets one day 1,  One tablet daily until gone, Disp: 6 tablet, Rfl: 0 .  benzonatate (TESSALON) 200 MG capsule, Take 1 capsule (200 mg total) by mouth 2 (two) times daily as needed for cough., Disp: 20 capsule, Rfl: 0 .  Cholecalciferol (VITAMIN D) 2000 units tablet, Take 2,000 Units by mouth daily., Disp: , Rfl:  .  cholestyramine (QUESTRAN) 4 g packet, Take 1 packet (4 g total) by mouth 3 (three) times daily with meals., Disp: 60 each, Rfl: 12 .  levothyroxine (SYNTHROID, LEVOTHROID) 88 MCG tablet, Take 1 tablet (88 mcg total) by mouth daily before breakfast., Disp: 90 tablet, Rfl: 1 .  predniSONE (DELTASONE) 10 MG tablet, 6 tablets on Day 1 , then reduce by 1 tablet daily until gone, Disp: 21 tablet, Rfl: 0  EXAM:  GENERAL: alert, oriented, appears well and in no acute distress  HEENT: atraumatic, conjunttiva clear, no obvious abnormalities on inspection of external nose and ears  NECK: normal movements of the head and neck  LUNGS: on inspection no signs of respiratory distress, breathing rate appears normal, no obvious gross SOB, gasping or wheezing  CV: no obvious cyanosis  MS: moves all visible extremities without noticeable abnormality. Able to raise right arm up above head & able to bend right arm at the elbow joint able to move right wrist without issue able to wiggle all fingers on right hand without problems able to make a tight fist with right hand without issues.  No obvious abnormality noticed right arm off of the video feed.  PSYCH/NEURO: pleasant and cooperative, no obvious depression or anxiety, speech and thought processing grossly intact  ASSESSMENT AND PLAN:  Discussed the following assessment and plan:  Numbness and tingling of right arm - Plan: Ambulatory referral to  Neurology  Right arm pain - Plan: Ambulatory referral to Neurology  No obvious abnormalities in right arm or forearm video chat.  Wondering if the sensation patient is describing could be a carpal tunnel exacerbation.  Advised that if she is having pain she can try either an ibuprofen or Tylenol as needed.  Advised that I will put in referral to neurology for nerve conduction study to rule out carpal tunnel.  Also discussed doing range of motion and stretching exercises of both arms to avoid any stiffness and help reduce pain.  Suggested patient can also try a topical muscle rub like a BenGay to see if this makes any difference. Reassured patient that she appears nontoxic over her video cough and I do not strongly suspect any cardiac issues causing pain in right arm.  Advised if any of her symptoms change  or worsen to call office right away and or go to ER by her evaluation if needed.    I discussed the assessment and treatment plan with the patient. The patient was provided an opportunity to ask questions and all were answered. The patient agreed with the plan and demonstrated an understanding of the instructions.   The patient was advised to call back or seek an in-person evaluation if the symptoms worsen or if the condition fails to improve as anticipated.   Jodelle Green, FNP

## 2019-05-07 DIAGNOSIS — E890 Postprocedural hypothyroidism: Secondary | ICD-10-CM | POA: Diagnosis not present

## 2019-05-22 ENCOUNTER — Telehealth: Payer: Self-pay | Admitting: *Deleted

## 2019-05-22 NOTE — Telephone Encounter (Signed)
Copied from [place] [phone]. Topic: General - Other >> May 22, 2019  8:57 AM Ivar Drape wrote: Reason for CRM:   Patient would like to come into the office for an appt because she fell and is experiencing a lot of Body Aches and want to get it checked out

## 2019-05-22 NOTE — Telephone Encounter (Signed)
Patient fell on Sunday , while at [employer] patient slipped in some spilled milk , she caught her self with her arms and  " I fell on my tail bone, my arms and lower back causing the most pain." Patient scheduled for [facility] 05/23/19 FYI

## 2019-05-23 ENCOUNTER — Other Ambulatory Visit: Payer: Self-pay

## 2019-05-23 ENCOUNTER — Encounter: Payer: Self-pay | Admitting: Internal Medicine

## 2019-05-23 ENCOUNTER — Ambulatory Visit (INDEPENDENT_AMBULATORY_CARE_PROVIDER_SITE_OTHER): Payer: PPO | Admitting: Internal Medicine

## 2019-05-23 VITALS — Temp 97.2°F

## 2019-05-23 DIAGNOSIS — M25561 Pain in right knee: Secondary | ICD-10-CM | POA: Diagnosis not present

## 2019-05-23 DIAGNOSIS — M545 Low back pain, unspecified: Secondary | ICD-10-CM

## 2019-05-23 DIAGNOSIS — M25521 Pain in right elbow: Secondary | ICD-10-CM

## 2019-05-23 DIAGNOSIS — M25562 Pain in left knee: Secondary | ICD-10-CM | POA: Diagnosis not present

## 2019-05-23 NOTE — Progress Notes (Signed)
Virtual Visit converted to Telephone  Note  This visit type was conducted due to national recommendations for restrictions regarding the COVID-19 pandemic (e.g. social distancing).  This format is felt to be most appropriate for this patient at this time.  All issues noted in this document were discussed and addressed.  No physical exam was performed (except for noted visual exam findings with Video Visits).   I connected with@ on 05/23/19 at 11:00 AM EDT by a video enabled telemedicine application and verified that I am speaking with the correct person using two identifiers. However, interactive audio and video telecommunications ultimately failed due to patient having technical difficulties  We continued and completed visit with audio only. .   Location patient: home Location provider: work or home office Persons participating in the virtual visit: patient, provider  I discussed the limitations, risks, security and privacy concerns of performing an evaluation and management service by telephone and the availability of in person appointments. I also discussed with the patient that there may be a patient responsible charge related to this service. The patient expressed understanding and agreed to proceed.  I Reason for visit:  PAIN FOLLOWING A FALL ON WET FLOOR   HPI:  68 yr old female presents with acute LOW BACK  PAIN ,  RIGHT ELBOW AND RIGHT KNEE PAIN following a fall at [employer].  Slipped on spilled milk,   Landed on buttock , right knee and right elbow.  The fall occurred on Sunday.  She notes bruising of  knee and elbow and pain to palpation of lower back that is  sharp and new .  Radio broadcast assistant was notified,  She returned on Monday to review the incident report because she had not heard from them yet  ROS: See pertinent positives and negatives per HPI.  Past Medical History:  Diagnosis Date  . Allergic rhinitis   . Arthritis   . Family history of adverse reaction to anesthesia    PONV  . GERD (gastroesophageal reflux disease)   . Herpes simplex   . History of colon polyps   . IBS (irritable bowel syndrome)   . PONV (postoperative nausea and vomiting)   . Thyroid disease    graves disease    Past Surgical History:  Procedure Laterality Date  . BREAST BIOPSY Right 2014   negative  . CHOLECYSTECTOMY N/A 09/21/2017   Procedure: LAPAROSCOPIC CHOLECYSTECTOMY WITH INTRAOPERATIVE CHOLANGIOGRAM;  Surgeon: Robert Bellow, MD;  Location: [facility];  Service: General;  Laterality: N/A;  . COLONOSCOPY  05/07/2014   5 mm hyperplastic polyp removed from the rectosigmoid.  Maternal aunt with history colon cancer.  10-year follow-up planned.  [name] KNEE ARTHROSCOPY  1992  . KNEE ARTHROSCOPY Right 06/08/2016   Procedure: ARTHROSCOPY KNEE- partial medial minisectomy and condroplasty;  Surgeon: Melrose Nakayama, MD;  Location: [place];  Service: Orthopedics;  Laterality: Right;  . ROTATOR CUFF REPAIR  2006   Margaretmary Eddy   . TONSILLECTOMY    . TUBAL LIGATION      Family History  Problem Relation Age of Onset  . Stroke Mother   . Alzheimer's disease Mother   . Hypertension Mother   . Colon polyps Maternal Aunt   . Cancer Maternal Aunt        colon  . Breast cancer Neg Hx     SOCIAL HX:  reports that she has never smoked. She has never used smokeless tobacco. She reports that she does not drink alcohol or use drugs.  Current Outpatient Medications:  .  acetaminophen (TYLENOL) 500 MG tablet, Take 500-1,000 mg by mouth every 6 (six) hours as needed (for pain/headaches.)., Disp: , Rfl:  .  acyclovir (ZOVIRAX) 400 MG tablet, Take 1 tablet (400 mg total) by mouth daily., Disp: 90 tablet, Rfl: 3 .  albuterol (PROAIR HFA) 108 (90 Base) MCG/ACT inhaler, Inhale 1-2 puffs into the lungs every 6 (six) hours as needed for wheezing or shortness of breath., Disp: 1 Inhaler, Rfl: 2 .  Cholecalciferol (VITAMIN D) 2000 units tablet, Take 2,000 Units by mouth daily., Disp: , Rfl:  .   levothyroxine (SYNTHROID, LEVOTHROID) 88 MCG tablet, Take 1 tablet (88 mcg total) by mouth daily before breakfast., Disp: 90 tablet, Rfl: 1  EXAM:  General impression: alert, cooperative and articulate.  No signs of being in distress  Lungs: speech is fluent sentence length suggests that patient is not short of breath and not punctuated by cough, sneezing or sniffing. [name]   Psych: affect normal.  speech is articulate and non pressured .  Denies suicidal thoughts   ASSESSMENT AND PLAN:  Discussed the following assessment and plan:  Acute low back pain without sciatica, unspecified back pain laterality - Plan: DG Lumbar Spine Complete  Right elbow pain - Plan: DG Elbow Complete Right  Acute pain of left knee - Plan: DG Knee Complete 4 Views Right  Acute pain of right knee  Acute midline low back pain without sciatica  Right knee pain Acute on chronic.  There are no fractures or patellar dislocation on plain films done today   Right elbow pain Secondary to blunt trauma from a slip and fall at [employer].  X rays done today show no fractures   Low back pain Plain films shows an abnormality of the L1 vertebrae , compression  fracture vs projection issue.  Patient encouraged to return for physical exam to determine  if her pain is in this area     I discussed the assessment and treatment plan with the patient. The patient was provided an opportunity to ask questions and all were answered. The patient agreed with the plan and demonstrated an understanding of the instructions.   The patient was advised to call back or seek an in-person evaluation if the symptoms worsen or if the condition fails to improve as anticipated.  I provided  25 minutes of non-face-to-face time during this encounter.   [name], MD

## 2019-05-23 NOTE — Progress Notes (Signed)
Pt fell at [employer] on 05/20/2019, pt stated that she slipped in some milk on the floor. Pt stated she slid all the way down trying to catch herself with her arms. Pt stated that her lower back, arms, right knee and neck hurts. Pt stated that she doesn't think she has any fractures but she does have some bruising.

## 2019-05-23 NOTE — Patient Instructions (Signed)
I have ordered x rays of your lumbar spine right elbow and right knee to be done at our office   You can use up to 3 advil every 8 hours   You can add up to 2000 mg of acetominophen (tylenol) every day safely  In divided doses (500 mg every 6 hours  Or 1000 mg every 12 hours.)  Katelyn Friedman will call you to arrange a time for the x rays

## 2019-05-24 ENCOUNTER — Ambulatory Visit (INDEPENDENT_AMBULATORY_CARE_PROVIDER_SITE_OTHER): Payer: PPO

## 2019-05-24 DIAGNOSIS — M545 Low back pain, unspecified: Secondary | ICD-10-CM | POA: Insufficient documentation

## 2019-05-24 DIAGNOSIS — M25461 Effusion, right knee: Secondary | ICD-10-CM | POA: Diagnosis not present

## 2019-05-24 DIAGNOSIS — M25521 Pain in right elbow: Secondary | ICD-10-CM | POA: Insufficient documentation

## 2019-05-24 DIAGNOSIS — M25562 Pain in left knee: Secondary | ICD-10-CM

## 2019-05-24 DIAGNOSIS — S59901A Unspecified injury of right elbow, initial encounter: Secondary | ICD-10-CM | POA: Diagnosis not present

## 2019-05-24 DIAGNOSIS — M549 Dorsalgia, unspecified: Secondary | ICD-10-CM | POA: Diagnosis not present

## 2019-05-24 DIAGNOSIS — S3992XA Unspecified injury of lower back, initial encounter: Secondary | ICD-10-CM | POA: Diagnosis not present

## 2019-05-24 NOTE — Assessment & Plan Note (Signed)
Secondary to blunt trauma from a slip and fall at [employer].  X rays done today show no fractures

## 2019-05-24 NOTE — Progress Notes (Signed)
Spoke with pt to let her know that she come by anytime between 1:30 and 3:30 for the xray and she does not need an appt for xrays. Pt stated that she would be by around 1:45 or 2pm.

## 2019-05-24 NOTE — Assessment & Plan Note (Signed)
Acute on chronic.  There are no fractures or patellar dislocation on plain films done today

## 2019-05-24 NOTE — Assessment & Plan Note (Addendum)
Plain films shows an abnormality of the L1 vertebrae , compression  fracture vs projection issue.  Patient encouraged to return for physical exam to determine  if her pain is in this area

## 2019-05-28 ENCOUNTER — Other Ambulatory Visit: Payer: Self-pay

## 2019-05-28 ENCOUNTER — Encounter: Payer: Self-pay | Admitting: Internal Medicine

## 2019-05-28 ENCOUNTER — Ambulatory Visit (INDEPENDENT_AMBULATORY_CARE_PROVIDER_SITE_OTHER): Payer: PPO

## 2019-05-28 DIAGNOSIS — M545 Low back pain, unspecified: Secondary | ICD-10-CM

## 2019-05-28 DIAGNOSIS — S3992XA Unspecified injury of lower back, initial encounter: Secondary | ICD-10-CM | POA: Diagnosis not present

## 2019-05-30 ENCOUNTER — Other Ambulatory Visit: Payer: Self-pay | Admitting: Internal Medicine

## 2019-05-30 DIAGNOSIS — M545 Low back pain, unspecified: Secondary | ICD-10-CM

## 2019-05-30 NOTE — Progress Notes (Signed)
MRI lumbar spine ordered due to indeterminate plain films to rule out fracture following a fall at grocery store with liability issues

## 2019-05-31 ENCOUNTER — Ambulatory Visit: Payer: Self-pay | Admitting: *Deleted

## 2019-05-31 ENCOUNTER — Other Ambulatory Visit: Payer: Self-pay

## 2019-05-31 ENCOUNTER — Ambulatory Visit (INDEPENDENT_AMBULATORY_CARE_PROVIDER_SITE_OTHER): Payer: PPO | Admitting: Internal Medicine

## 2019-05-31 ENCOUNTER — Encounter: Payer: Self-pay | Admitting: Internal Medicine

## 2019-05-31 DIAGNOSIS — S46812A Strain of other muscles, fascia and tendons at shoulder and upper arm level, left arm, initial encounter: Secondary | ICD-10-CM | POA: Diagnosis not present

## 2019-05-31 MED ORDER — TIZANIDINE HCL 4 MG PO CAPS: 4.0000 mg | ORAL_CAPSULE | Freq: Three times a day (TID) | ORAL | 0 refills | Status: DC

## 2019-05-31 NOTE — Telephone Encounter (Signed)
Pt has appt with PCP at 11:30

## 2019-05-31 NOTE — Progress Notes (Signed)
Virtual Visit via [email or web address]  This visit type was conducted due to national recommendations for restrictions regarding the COVID-19 pandemic (e.g. social distancing).  This format is felt to be most appropriate for this patient at this time.  All issues noted in this document were discussed and addressed.  No physical exam was performed (except for noted visual exam findings with Video Visits).   I connected with@ on 05/31/19 at 11:30 AM EDT by a video enabled telemedicine application or telephone and verified that I am speaking with the correct person using two identifiers. Location patient: home Location provider: work or home office Persons participating in the virtual visit: patient, provider  I discussed the limitations, risks, security and privacy concerns of performing an evaluation and management service by telephone and the availability of in person appointments. I also discussed with the patient that there may be a patient responsible charge related to this service. The patient expressed understanding and agreed to proceed.  Reason for visit: neck pain and sweling   HPI:   68 yr old right handed female with recent slip and fall at grocery store,  Occurred ten days ago.  With ongoing subsequent low back pain with possible L1 fracture (MRI ordered ) now presents with neck pain of 1-2 days duration. The pain starts in her trapezius muscle on the left and is aggravated by turning her head to the left .  The pain will radiate to the biceps muscle . She does not recall lhaving any neck pain after the fall/she has had no subsequent falls  Or unusual activity that resulted in an injruy.  However, she frequently picks up her 20 lb dog and cradles her in left arm .  [name]    ROS: See pertinent positives and negatives per HPI.  Past Medical History:  Diagnosis Date  . Allergic rhinitis   . Arthritis   . Family history of adverse reaction to anesthesia    PONV  . GERD (gastroesophageal reflux  disease)   . Herpes simplex   . History of colon polyps   . IBS (irritable bowel syndrome)   . PONV (postoperative nausea and vomiting)   . Thyroid disease    graves disease    Past Surgical History:  Procedure Laterality Date  . BREAST BIOPSY Right 2014   negative  . CHOLECYSTECTOMY N/A 09/21/2017   Procedure: LAPAROSCOPIC CHOLECYSTECTOMY WITH INTRAOPERATIVE CHOLANGIOGRAM;  Surgeon: Robert Bellow, MD;  Location: [facility];  Service: General;  Laterality: N/A;  . COLONOSCOPY  05/07/2014   5 mm hyperplastic polyp removed from the rectosigmoid.  Maternal aunt with history colon cancer.  10-year follow-up planned.  [name] KNEE ARTHROSCOPY  1992  . KNEE ARTHROSCOPY Right 06/08/2016   Procedure: ARTHROSCOPY KNEE- partial medial minisectomy and condroplasty;  Surgeon: Melrose Nakayama, MD;  Location: [place];  Service: Orthopedics;  Laterality: Right;  . ROTATOR CUFF REPAIR  2006   Margaretmary Eddy   . TONSILLECTOMY    . TUBAL LIGATION      Family History  Problem Relation Age of Onset  . Stroke Mother   . Alzheimer's disease Mother   . Hypertension Mother   . Colon polyps Maternal Aunt   . Cancer Maternal Aunt        colon  . Breast cancer Neg Hx     SOCIAL HX:  Social History   Socioeconomic History  . Marital status: Married    Spouse name: Not on file  . Number of children: Not on file  .  Years of education: Not on file  . Highest education level: Not on file  Occupational History  . Not on file  Social Needs  . Financial resource strain: Not hard at all  . Food insecurity:    Worry: Never true    Inability: Never true  . Transportation needs:    Medical: No    Non-medical: No  Tobacco Use  . Smoking status: Never Smoker  . Smokeless tobacco: Never Used  Substance and Sexual Activity  . Alcohol use: No    Alcohol/week: 0.0 standard drinks  . Drug use: No  . Sexual activity: Yes    Partners: Male  Lifestyle  . Physical activity:    Days per week: Not on file     Minutes per session: Not on file  . Stress: Not at all  Relationships  . Social connections:    Talks on phone: Not on file    Gets together: Not on file    Attends religious service: Not on file    Active member of club or organization: Not on file    Attends meetings of clubs or organizations: Not on file    Relationship status: Not on file  . Intimate partner violence:    Fear of current or ex partner: No    Emotionally abused: No    Physically abused: No    Forced sexual activity: No  Other Topics Concern  . Not on file  Social History Narrative   Married: 19 yrs   Regular exercise: walk   Caffeine use: none      Current Outpatient Medications:  .  acetaminophen (TYLENOL) 500 MG tablet, Take 500-1,000 mg by mouth every 6 (six) hours as needed (for pain/headaches.)., Disp: , Rfl:  .  acyclovir (ZOVIRAX) 400 MG tablet, Take 1 tablet (400 mg total) by mouth daily., Disp: 90 tablet, Rfl: 3 .  albuterol (PROAIR HFA) 108 (90 Base) MCG/ACT inhaler, Inhale 1-2 puffs into the lungs every 6 (six) hours as needed for wheezing or shortness of breath., Disp: 1 Inhaler, Rfl: 2 .  Cholecalciferol (VITAMIN D) 2000 units tablet, Take 2,000 Units by mouth daily., Disp: , Rfl:  .  levothyroxine (SYNTHROID, LEVOTHROID) 88 MCG tablet, Take 1 tablet (88 mcg total) by mouth daily before breakfast., Disp: 90 tablet, Rfl: 1 .  tiZANidine (ZANAFLEX) 4 MG capsule, Take 1 capsule (4 mg total) by mouth 3 (three) times daily., Disp: 60 capsule, Rfl: 0  EXAM:  VITALS per patient if applicable:  GENERAL: alert, oriented, appears well and in no acute distress  HEENT: atraumatic, conjunttiva clear, no obvious abnormalities on inspection of external nose and ears  NECK: restricted ROM with regard to rotation of neck to left due to pain in trapezius muscle .  No obvious swelling or bruisinf of SCM  Or tramp muscle.  no obvious masses.   LUNGS: on inspection no signs of respiratory distress, breathing rate  appears normal, no obvious gross SOB, gasping or wheezing  CV: no obvious cyanosis  MS: moves all visible extremities without noticeable abnormality  PSYCH/NEURO: pleasant and cooperative, no obvious depression or anxiety, speech and thought processing grossly intact  ASSESSMENT AND PLAN:  Discussed the following assessment and plan:  Trapezius strain, left, initial encounter  Trapezius strain, left, initial encounter Does not appear to be related to her fall  Given the acute nature.  Lifting her dog may be contributing.  Treating witih NSAIDs , tylenol and muscle relaxer     I  discussed the assessment and treatment plan with the patient. The patient was provided an opportunity to ask questions and all were answered. The patient agreed with the plan and demonstrated an understanding of the instructions.   The patient was advised to call back or seek an in-person evaluation if the symptoms worsen or if the condition fails to improve as anticipated.  I provided 25 minutes of non-face-to-face time during this encounter.   [name], MD

## 2019-05-31 NOTE — Patient Instructions (Signed)
You appear to have a Trapezius muscle strain/spasm  Ibuprofen 600 mg every 6 to 8 hors  You can add up to 2000 mg of acetominophen (tylenol) every day safely  In divided doses (500 mg every 6 hours  Or 1000 mg every 12 hours.) Generic zanaflex, (muscle relaxer) every 8 hours (if tolerated)  ICE FOR 15 MINUTES SEVERAL TIMES DAILY  IF NO IMPROVEMENT AFTER 4 OR 5 DAYS  LET ME KNOW

## 2019-05-31 NOTE — Telephone Encounter (Signed)
Pt reports left sided neck pain and stiffness , onset yesterday. States radiates to shoulder and shoulder blade. Reports mild swelling at left side of neck and "Possibly a little towards the front." Rates at 8/10. States took ibuprofen yesterday which "Helped a little." Does not recall any recent injury but reports did fall 1-1/2 weeks ago. Denies fever, sob. Pts email and ph # verified, call transferred to practice, [facility], for consideration of appt.   Reason for Disposition . [1] SEVERE neck pain (e.g., excruciating, unable to do any normal activities) AND [2] not improved after 2 hours of pain medicine  Answer Assessment - Initial Assessment Questions 1. ONSET: "When did the pain begin?"      yesterday 2. LOCATION: "Where does it hurt?"     Left side of neck into shoulder and shoulder blade 3. PATTERN "Does the pain come and go, or has it been constant since it started?"      Constant 4. SEVERITY: "How bad is the pain?"  (Scale 1-10; or mild, moderate, severe)   - MILD (1-3): doesn't interfere with normal activities    - MODERATE (4-7): interferes with normal activities or awakens from sleep    - SEVERE (8-10):  excruciating pain, unable to do any normal activities      8/10 5. RADIATION: "Does the pain go anywhere else, shoot into your arms?"     Shoulder shoulder blade 6. CORD SYMPTOMS: "Any weakness or numbness of the arms or legs?"     no 7. CAUSE: "What do you think is causing the neck pain?"     Unsure 8. NECK OVERUSE: "Any recent activities that involved turning or twisting the neck?"     no 9. OTHER SYMPTOMS: "Do you have any other symptoms?" (e.g., headache, fever, chest pain, difficulty breathing, neck swelling)    Maybe swelling on left side  Protocols used: NECK PAIN OR STIFFNESS-A-AH

## 2019-06-01 DIAGNOSIS — H2511 Age-related nuclear cataract, right eye: Secondary | ICD-10-CM | POA: Diagnosis not present

## 2019-06-02 DIAGNOSIS — S46812A Strain of other muscles, fascia and tendons at shoulder and upper arm level, left arm, initial encounter: Secondary | ICD-10-CM | POA: Insufficient documentation

## 2019-06-02 NOTE — Assessment & Plan Note (Signed)
Does not appear to be related to her fall  Given the acute nature.  Lifting her dog may be contributing.  Treating witih NSAIDs , tylenol and muscle relaxer

## 2019-06-06 ENCOUNTER — Other Ambulatory Visit: Payer: Self-pay | Admitting: Podiatry

## 2019-06-06 ENCOUNTER — Ambulatory Visit (INDEPENDENT_AMBULATORY_CARE_PROVIDER_SITE_OTHER): Payer: PPO

## 2019-06-06 ENCOUNTER — Encounter: Payer: Self-pay | Admitting: Podiatry

## 2019-06-06 ENCOUNTER — Ambulatory Visit: Payer: PPO

## 2019-06-06 ENCOUNTER — Ambulatory Visit: Payer: PPO | Admitting: Podiatry

## 2019-06-06 ENCOUNTER — Other Ambulatory Visit: Payer: Self-pay

## 2019-06-06 VITALS — BP 140/79 | HR 60 | Temp 98.1°F | Resp 16

## 2019-06-06 DIAGNOSIS — M779 Enthesopathy, unspecified: Secondary | ICD-10-CM | POA: Diagnosis not present

## 2019-06-06 DIAGNOSIS — M778 Other enthesopathies, not elsewhere classified: Secondary | ICD-10-CM

## 2019-06-06 DIAGNOSIS — Q828 Other specified congenital malformations of skin: Secondary | ICD-10-CM | POA: Diagnosis not present

## 2019-06-06 DIAGNOSIS — M7752 Other enthesopathy of left foot: Secondary | ICD-10-CM

## 2019-06-06 NOTE — Progress Notes (Signed)
Subjective:  Patient ID: Katelyn Friedman, female    DOB: June 01, 1951,  MRN: [id number] HPI Chief Complaint  Patient presents with  . Foot Pain    Sub 5th MPJ bilateral (R>L) - thick callused areas x several months, using OTC callus remover, hurts to walk  . New Patient (Initial Visit)    68 y.o. female presents with the above complaint.   ROS: Denies fever chills nausea vomiting muscle aches pains calf pain back pain chest pain shortness of breath.  Past Medical History:  Diagnosis Date  . Allergic rhinitis   . Arthritis   . Family history of adverse reaction to anesthesia    PONV  . GERD (gastroesophageal reflux disease)   . Herpes simplex   . History of colon polyps   . IBS (irritable bowel syndrome)   . PONV (postoperative nausea and vomiting)   . Thyroid disease    graves disease   Past Surgical History:  Procedure Laterality Date  . BREAST BIOPSY Right 2014   negative  . CHOLECYSTECTOMY N/A 09/21/2017   Procedure: LAPAROSCOPIC CHOLECYSTECTOMY WITH INTRAOPERATIVE CHOLANGIOGRAM;  Surgeon: Robert Bellow, MD;  Location: [facility];  Service: General;  Laterality: N/A;  . COLONOSCOPY  05/07/2014   5 mm hyperplastic polyp removed from the rectosigmoid.  Maternal aunt with history colon cancer.  10-year follow-up planned.  [name] KNEE ARTHROSCOPY  1992  . KNEE ARTHROSCOPY Right 06/08/2016   Procedure: ARTHROSCOPY KNEE- partial medial minisectomy and condroplasty;  Surgeon: Melrose Nakayama, MD;  Location: [place];  Service: Orthopedics;  Laterality: Right;  . ROTATOR CUFF REPAIR  2006   Margaretmary Eddy   . TONSILLECTOMY    . TUBAL LIGATION      Current Outpatient Medications:  .  acetaminophen (TYLENOL) 500 MG tablet, Take 500-1,000 mg by mouth every 6 (six) hours as needed (for pain/headaches.)., Disp: , Rfl:  .  acyclovir (ZOVIRAX) 400 MG tablet, Take 1 tablet (400 mg total) by mouth daily., Disp: 90 tablet, Rfl: 3 .  albuterol (PROAIR HFA) 108 (90 Base) MCG/ACT inhaler, Inhale  1-2 puffs into the lungs every 6 (six) hours as needed for wheezing or shortness of breath., Disp: 1 Inhaler, Rfl: 2 .  Cholecalciferol (VITAMIN D) 2000 units tablet, Take 2,000 Units by mouth daily., Disp: , Rfl:  .  levothyroxine (SYNTHROID, LEVOTHROID) 88 MCG tablet, Take 1 tablet (88 mcg total) by mouth daily before breakfast., Disp: 90 tablet, Rfl: 1 .  tiZANidine (ZANAFLEX) 4 MG capsule, Take 1 capsule (4 mg total) by mouth 3 (three) times daily., Disp: 60 capsule, Rfl: 0  Allergies  Allergen Reactions  . Influenza Vaccines Swelling and Other (See Comments)    Redness, Fever  . Amoxicillin Diarrhea  . Codeine Nausea Only  . Mobic [Meloxicam] Itching and Rash    Nausea Vomiting   Review of Systems Objective:   Vitals:   06/06/19 1701  BP: 140/79  Pulse: 60  Resp: 16  Temp: 98.1 F (36.7 C)    General: Well developed, nourished, in no acute distress, alert and oriented x3   Dermatological: Skin is warm, dry and supple bilateral. Nails x 10 are well maintained; remaining integument appears unremarkable at this time. There are no open sores, no preulcerative lesions, no rash or signs of infection present.  Severe reactive hyperkeratotic tissue sub-fifth metatarsal head of the bilateral foot.  Vascular: Dorsalis Pedis artery and Posterior Tibial artery pedal pulses are 2/4 bilateral with immedate capillary fill time. Pedal hair growth present. No  varicosities and no lower extremity edema present bilateral.   Neruologic: Grossly intact via light touch bilateral. Vibratory intact via tuning fork bilateral. Protective threshold with Semmes Wienstein monofilament intact to all pedal sites bilateral. Patellar and Achilles deep tendon reflexes 2+ bilateral. No Babinski or clonus noted bilateral.   Musculoskeletal: No gross boney pedal deformities bilateral. No pain, crepitus, or limitation noted with foot and ankle range of motion bilateral. Muscular strength 5/5 in all groups tested  bilateral tailor's bunion deformities bilateral..  Gait: Unassisted, Nonantalgic.    Radiographs:  Tailor's bunion deformities bilateral.  No acute findings.    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  Plan: Mechanical debridement of reactive hyperkeratotic tissue followed by chemical debridement.  This will be left under occlusion and washed off thoroughly tomorrow I will follow-up with her in the near future.     Max T. [place], [place]

## 2019-06-14 ENCOUNTER — Other Ambulatory Visit: Payer: Self-pay

## 2019-06-14 ENCOUNTER — Ambulatory Visit
Admission: RE | Admit: 2019-06-14 | Discharge: 2019-06-14 | Disposition: A | Discharge status: Home / Self Care | Payer: PPO | Source: Ambulatory Visit | Attending: Internal Medicine | Admitting: Internal Medicine

## 2019-06-14 DIAGNOSIS — M545 Low back pain, unspecified: Secondary | ICD-10-CM

## 2019-06-14 DIAGNOSIS — S3992XA Unspecified injury of lower back, initial encounter: Secondary | ICD-10-CM | POA: Diagnosis not present

## 2019-06-16 DIAGNOSIS — M5431 Sciatica, right side: Secondary | ICD-10-CM

## 2019-06-25 DIAGNOSIS — M5489 Other dorsalgia: Secondary | ICD-10-CM | POA: Diagnosis not present

## 2019-07-03 DIAGNOSIS — M5136 Other intervertebral disc degeneration, lumbar region: Secondary | ICD-10-CM | POA: Diagnosis not present

## 2019-07-03 DIAGNOSIS — M62830 Muscle spasm of back: Secondary | ICD-10-CM | POA: Diagnosis not present

## 2019-07-03 DIAGNOSIS — M47816 Spondylosis without myelopathy or radiculopathy, lumbar region: Secondary | ICD-10-CM | POA: Diagnosis not present

## 2019-07-16 ENCOUNTER — Other Ambulatory Visit: Payer: Self-pay

## 2019-07-16 DIAGNOSIS — Z20822 Contact with and (suspected) exposure to covid-19: Secondary | ICD-10-CM

## 2019-07-20 LAB — NOVEL CORONAVIRUS, NAA: SARS-CoV-2, NAA: NOT DETECTED

## 2019-08-01 DIAGNOSIS — M545 Low back pain: Secondary | ICD-10-CM | POA: Diagnosis not present

## 2019-08-03 DIAGNOSIS — M545 Low back pain: Secondary | ICD-10-CM | POA: Diagnosis not present

## 2019-08-03 DIAGNOSIS — E890 Postprocedural hypothyroidism: Secondary | ICD-10-CM | POA: Diagnosis not present

## 2019-08-07 DIAGNOSIS — M545 Low back pain: Secondary | ICD-10-CM | POA: Diagnosis not present

## 2019-08-09 DIAGNOSIS — E890 Postprocedural hypothyroidism: Secondary | ICD-10-CM | POA: Diagnosis not present

## 2019-08-09 DIAGNOSIS — M545 Low back pain: Secondary | ICD-10-CM | POA: Diagnosis not present

## 2019-08-13 DIAGNOSIS — M6281 Muscle weakness (generalized): Secondary | ICD-10-CM | POA: Diagnosis not present

## 2019-08-15 DIAGNOSIS — M6281 Muscle weakness (generalized): Secondary | ICD-10-CM | POA: Diagnosis not present

## 2019-08-20 DIAGNOSIS — M6281 Muscle weakness (generalized): Secondary | ICD-10-CM | POA: Diagnosis not present

## 2019-08-21 ENCOUNTER — Other Ambulatory Visit: Payer: Self-pay

## 2019-08-22 DIAGNOSIS — M6281 Muscle weakness (generalized): Secondary | ICD-10-CM | POA: Diagnosis not present

## 2019-08-23 ENCOUNTER — Encounter: Payer: Self-pay | Admitting: Internal Medicine

## 2019-08-23 ENCOUNTER — Other Ambulatory Visit: Payer: Self-pay

## 2019-08-23 ENCOUNTER — Ambulatory Visit (INDEPENDENT_AMBULATORY_CARE_PROVIDER_SITE_OTHER): Payer: PPO | Admitting: Internal Medicine

## 2019-08-23 VITALS — BP 108/64 | HR 68 | Temp 97.3°F | Resp 15 | Ht 63.0 in | Wt 178.8 lb

## 2019-08-23 DIAGNOSIS — Z01818 Encounter for other preprocedural examination: Secondary | ICD-10-CM

## 2019-08-23 DIAGNOSIS — M545 Low back pain, unspecified: Secondary | ICD-10-CM

## 2019-08-23 DIAGNOSIS — Z23 Encounter for immunization: Secondary | ICD-10-CM

## 2019-08-23 NOTE — Patient Instructions (Signed)
I HAVE ORDERED A METABOLIC PANEL that you can get at [facility]

## 2019-08-25 NOTE — Assessment & Plan Note (Signed)
Patient was seen today.  She is cleared from a cardiac standpoint: I have ordered and reviewed an EKG, which is  is unchanged from last year's EKG.

## 2019-08-25 NOTE — Progress Notes (Signed)
Subjective:  Patient ID: Katelyn Friedman, female    DOB: [birth year]  Age: 68 y.o. MRN: [id number]  CC: The primary encounter diagnosis was Preoperative testing. Diagnoses of Need for immunization against influenza, Preoperative clearance, and Acute midline low back pain without sciatica were also pertinent to this visit.  HPI Katelyn Friedman presents for preoperative assessment/clearance.  She is seeing Dr. Para March next week and anticipates having  arthroscopy for left medial meniscal repair .   She has no history of diabetes or CKD.  She has no history of CAD and denies any history of chest pain in the last 6 months.  BACK PAI N following a fall : Referred by neurosurgeon Lacinda Axon  To [facility] in July 2020 and to PT  Thyroid: dose reduced reduced recently from 88 mcg daily to  4 days of 88 and 3 days of 50 mcg. Repeat level is to be done sept 2   COVID TEST NEGATIVE July 20  Outpatient Medications Prior to Visit  Medication Sig Dispense Refill  . acetaminophen (TYLENOL) 500 MG tablet Take 500-1,000 mg by mouth every 6 (six) hours as needed (for pain/headaches.).    [name] acyclovir (ZOVIRAX) 400 MG tablet Take 1 tablet (400 mg total) by mouth daily. 90 tablet 3  . albuterol (PROAIR HFA) 108 (90 Base) MCG/ACT inhaler Inhale 1-2 puffs into the lungs every 6 (six) hours as needed for wheezing or shortness of breath. 1 Inhaler 2  . Cholecalciferol (VITAMIN D) 2000 units tablet Take 2,000 Units by mouth daily.    [name] levothyroxine (SYNTHROID) 50 MCG tablet TAKE 1 TABLET BY MOUTH ONCE DAILY IN THE MORNING ON AN EMPTY STOMACH FRIDAYS THRU SUNDAYS    . levothyroxine (SYNTHROID, LEVOTHROID) 88 MCG tablet Take 1 tablet (88 mcg total) by mouth daily before breakfast. (Patient taking differently: Take 88 mcg by mouth daily before breakfast. ) 90 tablet 1  . tiZANidine (ZANAFLEX) 4 MG capsule Take 1 capsule (4 mg total) by mouth 3 (three) times daily. (Patient not taking: Reported on 08/23/2019) 60 capsule 0    No facility-administered medications prior to visit.     Review of Systems;  Patient denies headache, fevers, malaise, unintentional weight loss, skin rash, eye pain, sinus congestion and sinus pain, sore throat, dysphagia,  hemoptysis , cough, dyspnea, wheezing, chest pain, palpitations, orthopnea, edema, abdominal pain, nausea, melena, diarrhea, constipation, flank pain, dysuria, hematuria, urinary  Frequency, nocturia, numbness, tingling, seizures,  Focal weakness, Loss of consciousness,  Tremor, insomnia, depression, anxiety, and suicidal ideation.      Objective:  BP 108/64 (BP Location: Left Arm, Patient Position: Sitting, Cuff Size: Large)   Pulse 68   Temp (!) 97.3 F (36.3 C) (Temporal)   Resp 15   Ht 5\' 3"  (1.6 m)   Wt 178 lb 12.8 oz (81.1 kg)   SpO2 97%   BMI 31.67 kg/m   BP Readings from Last 3 Encounters:  08/23/19 108/64  06/06/19 140/79  01/30/19 120/72    Wt Readings from Last 3 Encounters:  08/23/19 178 lb 12.8 oz (81.1 kg)  01/30/19 172 lb 12.8 oz (78.4 kg)  12/18/18 179 lb 6.4 oz (81.4 kg)    General appearance: alert, cooperative and appears stated age Ears: normal TM's and external ear canals both ears Throat: lips, mucosa, and tongue normal; teeth and gums normal Neck: no adenopathy, no carotid bruit, supple, symmetrical, trachea midline and thyroid not enlarged, symmetric, no tenderness/mass/nodules Back: symmetric, no curvature. ROM normal. No CVA tenderness.  Lungs: clear to auscultation bilaterally Heart: regular rate and rhythm, S1, S2 normal, no murmur, click, rub or gallop Abdomen: soft, non-tender; bowel sounds normal; no masses,  no organomegaly Pulses: 2+ and symmetric Skin: Skin color, texture, turgor normal. No rashes or lesions Lymph nodes: Cervical, supraclavicular, and axillary nodes normal.  No results found for: HGBA1C  Lab Results  Component Value Date   CREATININE 0.92 01/30/2019   CREATININE 0.83 05/09/2018   CREATININE 0.94  07/19/2017    Lab Results  Component Value Date   WBC 6.7 01/30/2019   HGB 12.9 01/30/2019   HCT 39.4 01/30/2019   PLT 242.0 01/30/2019   GLUCOSE 75 01/30/2019   CHOL 166 01/30/2019   TRIG 63.0 01/30/2019   HDL 51.40 01/30/2019   LDLDIRECT 151 (H) 08/25/2016   LDLCALC 102 (H) 01/30/2019   ALT 11 01/30/2019   AST 17 01/30/2019   NA 141 01/30/2019   K 4.4 01/30/2019   CL 104 01/30/2019   CREATININE 0.92 01/30/2019   BUN 16 01/30/2019   CO2 22 01/30/2019   TSH 0.05 (L) 01/30/2019   INR 1.0 05/09/2018    Mr Lumbar Spine Wo Contrast  Result Date: 06/15/2019 CLINICAL DATA:  Fall 2 weeks ago with abnormal x-ray EXAM: MRI LUMBAR SPINE WITHOUT CONTRAST TECHNIQUE: Multiplanar, multisequence MR imaging of the lumbar spine was performed. No intravenous contrast was administered. COMPARISON:  Radiography 05/28/2019 FINDINGS: Segmentation:  5 lumbar type vertebrae Alignment: Mild scoliosis. Degenerative grade 1 anterolisthesis at L5-S1. Vertebrae: No fracture, evidence of discitis, or bone lesion. Hemangioma is partly seen in the right ilium. Conus medullaris and cauda equina: Conus extends to the L1-2 level. Conus and cauda equina appear normal. Paraspinal and other soft tissues: Negative Disc levels: T12- L1: Mild spondylosis.  No impingement L1-L2: Tiny disc protrusion.  No impingement L2-L3: Unremarkable. L3-L4: Unremarkable. L4-L5: Degenerative facet spurring and mild disc bulging. No impingement L5-S1:Degenerative facet spurring with anterolisthesis. Spondylosis and asymmetric rightward disc narrowing and endplate ridging. Spur contacts the right L5 nerve root without compressive narrowing. IMPRESSION: 1. Negative for fracture or other acute finding. 2. L4-5 and L5-S1 advanced facet arthropathy with L5-S1 anterolisthesis. No neural impingement. Electronically Signed   By: Monte Fantasia M.D.   On: 06/15/2019 09:36    Assessment & Plan:   Problem List Items Addressed This Visit       Unprioritized   Preoperative clearance    Patient was seen today.  She is cleared from a cardiac standpoint: I have ordered and reviewed an EKG, which is  is unchanged from last year's EKG.      Low back pain    She was referred to PT for therapeutic exercise and TENS unit by Dr. Lacinda Axon after her evaluation did not result in a need for surgery       Other Visit Diagnoses    Preoperative testing    -  Primary   Relevant Orders   EKG 12-Lead (Completed)   Comprehensive metabolic panel   Need for immunization against influenza       Relevant Orders   Flu Vaccine QUAD High Dose(Fluad) (Completed)      I have discontinued Solara L. Mauger's tiZANidine. I am also having her maintain her Vitamin D, acetaminophen, albuterol, acyclovir, levothyroxine, and levothyroxine.  No orders of the defined types were placed in this encounter.   Medications Discontinued During This Encounter  Medication Reason  . tiZANidine (ZANAFLEX) 4 MG capsule Patient Preference    Follow-up: No follow-ups  on file.   [name], MD

## 2019-08-25 NOTE — Assessment & Plan Note (Addendum)
She was referred to PT for therapeutic exercise and TENS unit by Dr. Lacinda Axon after her evaluation did not result in a need for surgery

## 2019-08-28 DIAGNOSIS — R200 Anesthesia of skin: Secondary | ICD-10-CM | POA: Diagnosis not present

## 2019-08-29 DIAGNOSIS — M6281 Muscle weakness (generalized): Secondary | ICD-10-CM | POA: Diagnosis not present

## 2019-08-31 DIAGNOSIS — E559 Vitamin D deficiency, unspecified: Secondary | ICD-10-CM | POA: Diagnosis not present

## 2019-08-31 DIAGNOSIS — M6281 Muscle weakness (generalized): Secondary | ICD-10-CM | POA: Diagnosis not present

## 2019-08-31 DIAGNOSIS — E539 Vitamin B deficiency, unspecified: Secondary | ICD-10-CM | POA: Diagnosis not present

## 2019-08-31 DIAGNOSIS — R200 Anesthesia of skin: Secondary | ICD-10-CM | POA: Diagnosis not present

## 2019-09-13 DIAGNOSIS — M25562 Pain in left knee: Secondary | ICD-10-CM | POA: Diagnosis not present

## 2019-09-13 DIAGNOSIS — M6281 Muscle weakness (generalized): Secondary | ICD-10-CM | POA: Diagnosis not present

## 2019-09-20 DIAGNOSIS — M25562 Pain in left knee: Secondary | ICD-10-CM | POA: Diagnosis not present

## 2019-09-20 DIAGNOSIS — E890 Postprocedural hypothyroidism: Secondary | ICD-10-CM | POA: Diagnosis not present

## 2019-09-27 DIAGNOSIS — M25562 Pain in left knee: Secondary | ICD-10-CM | POA: Diagnosis not present

## 2019-11-29 DIAGNOSIS — R200 Anesthesia of skin: Secondary | ICD-10-CM | POA: Diagnosis not present

## 2019-12-03 ENCOUNTER — Other Ambulatory Visit: Payer: Self-pay | Admitting: Neurology

## 2019-12-03 DIAGNOSIS — R200 Anesthesia of skin: Secondary | ICD-10-CM

## 2019-12-13 ENCOUNTER — Ambulatory Visit: Payer: PPO

## 2019-12-17 ENCOUNTER — Other Ambulatory Visit: Payer: Self-pay

## 2019-12-20 ENCOUNTER — Ambulatory Visit: Payer: PPO

## 2019-12-20 ENCOUNTER — Ambulatory Visit: Payer: PPO | Admitting: Internal Medicine

## 2019-12-24 ENCOUNTER — Other Ambulatory Visit: Payer: Self-pay

## 2019-12-24 ENCOUNTER — Ambulatory Visit: Payer: PPO | Admitting: Internal Medicine

## 2019-12-24 ENCOUNTER — Ambulatory Visit (INDEPENDENT_AMBULATORY_CARE_PROVIDER_SITE_OTHER): Payer: PPO

## 2019-12-24 VITALS — Ht 63.0 in | Wt 178.0 lb

## 2019-12-24 DIAGNOSIS — Z0000 Encounter for general adult medical examination without abnormal findings: Secondary | ICD-10-CM

## 2019-12-24 NOTE — Progress Notes (Addendum)
Subjective:   Katelyn Friedman is a 68 y.o. female who presents for Medicare Annual (Subsequent) preventive examination.  Review of Systems:  No ROS.  Medicare Wellness Virtual Visit.  Visual/audio telehealth visit, UTA vital signs.   Wt/Ht provided by patient.  See social history for additional risk factors.   Cardiac Risk Factors include: advanced age (>47men, >55 women)     Objective:     Vitals: Ht 5\' 3"  (1.6 m)   Wt 178 lb (80.7 kg)   BMI 31.53 kg/m   Body mass index is 31.53 kg/m.  Advanced Directives 12/24/2019 12/18/2018 10/26/2017 09/21/2017 09/13/2017 07/18/2017 07/18/2017  Does Patient Have a Medical Advance Directive? No Yes No No No No No  Type of Advance Directive - [place]  Does patient want to make changes to medical advance directive? - No - Patient declined - - - - -  Copy of [place] in Chart? - No - copy requested - - - - -  Would patient like information on creating a medical advance directive? No - Patient declined - No - Patient declined No - Patient declined No - Patient declined Yes (Inpatient - patient requests chaplain consult to create a medical advance directive) No - Patient declined    Tobacco Social History   Tobacco Use  Smoking Status Never Smoker  Smokeless Tobacco Never Used     Counseling given: Not Answered   Clinical Intake:  Pre-visit preparation completed: Yes        Diabetes: No  How often do you need to have someone help you when you read instructions, pamphlets, or other written materials from your doctor or pharmacy?: 1 - Never  Interpreter Needed?: No     Past Medical History:  Diagnosis Date  . Allergic rhinitis   . Arthritis   . Family history of adverse reaction to anesthesia    PONV  . GERD (gastroesophageal reflux disease)   . Herpes simplex   . History of colon polyps   . IBS (irritable bowel syndrome)   . PONV (postoperative nausea and vomiting)     . Thyroid disease    graves disease   Past Surgical History:  Procedure Laterality Date  . BREAST BIOPSY Right 2014   negative  . CHOLECYSTECTOMY N/A 09/21/2017   Procedure: LAPAROSCOPIC CHOLECYSTECTOMY WITH INTRAOPERATIVE CHOLANGIOGRAM;  Surgeon: Robert Bellow, MD;  Location: [facility];  Service: General;  Laterality: N/A;  . COLONOSCOPY  05/07/2014   5 mm hyperplastic polyp removed from the rectosigmoid.  Maternal aunt with history colon cancer.  10-year follow-up planned.  [name] KNEE ARTHROSCOPY  1992  . KNEE ARTHROSCOPY Right 06/08/2016   Procedure: ARTHROSCOPY KNEE- partial medial minisectomy and condroplasty;  Surgeon: Melrose Nakayama, MD;  Location: [facility];  Service: Orthopedics;  Laterality: Right;  . ROTATOR CUFF REPAIR  2006   Margaretmary Eddy   . TONSILLECTOMY    . TUBAL LIGATION     Family History  Problem Relation Age of Onset  . Stroke Mother   . Alzheimer's disease Mother   . Hypertension Mother   . Colon polyps Maternal Aunt   . Cancer Maternal Aunt        colon  . Breast cancer Neg Hx    Social History   Socioeconomic History  . Marital status: Married    Spouse name: Not on file  . Number of children: Not on file  . Years of education: Not  on file  . Highest education level: Not on file  Occupational History  . Not on file  Tobacco Use  . Smoking status: Never Smoker  . Smokeless tobacco: Never Used  Substance and Sexual Activity  . Alcohol use: No    Alcohol/week: 0.0 standard drinks  . Drug use: No  . Sexual activity: Yes    Partners: Male  Other Topics Concern  . Not on file  Social History Narrative   Married: 19 yrs   Regular exercise: walk   Caffeine use: none   Social Determinants of Health   Financial Resource Strain: Low Risk   . Difficulty of Paying Living Expenses: Not hard at all  Food Insecurity: No Food Insecurity  . Worried About Charity fundraiser in the Last Year: Never true  . Ran Out of Food in the Last Year: Never true   Transportation Needs: No Transportation Needs  . Lack of Transportation (Medical): No  . Lack of Transportation (Non-Medical): No  Physical Activity: Insufficiently Active  . Days of Exercise per Week: 7 days  . Minutes of Exercise per Session: 10 min  Stress: No Stress Concern Present  . Feeling of Stress : Not at all  Social Connections: Unknown  . Frequency of Communication with Friends and Family: Three times a week  . Frequency of Social Gatherings with Friends and Family: More than three times a week  . Attends Religious Services: Not on file  . Active Member of Clubs or Organizations: Not on file  . Attends Archivist Meetings: Not on file  . Marital Status: Married    Outpatient Encounter Medications as of 12/24/2019  Medication Sig  . acetaminophen (TYLENOL) 500 MG tablet Take 500-1,000 mg by mouth every 6 (six) hours as needed (for pain/headaches.).  [name] acyclovir (ZOVIRAX) 400 MG tablet Take 1 tablet (400 mg total) by mouth daily.  [name] albuterol (PROAIR HFA) 108 (90 Base) MCG/ACT inhaler Inhale 1-2 puffs into the lungs every 6 (six) hours as needed for wheezing or shortness of breath.  . Cholecalciferol (VITAMIN D) 2000 units tablet Take 2,000 Units by mouth daily.  [name] levothyroxine (SYNTHROID) 50 MCG tablet TAKE 1 TABLET BY MOUTH ONCE DAILY IN THE MORNING ON AN EMPTY STOMACH FRIDAYS THRU SUNDAYS  . levothyroxine (SYNTHROID, LEVOTHROID) 88 MCG tablet Take 1 tablet (88 mcg total) by mouth daily before breakfast. (Patient taking differently: Take 75 mcg by mouth daily before breakfast. )  . [DISCONTINUED] levothyroxine (SYNTHROID) 75 MCG tablet Take 75 mcg by mouth every morning.   No facility-administered encounter medications on file as of 12/24/2019.    Activities of Daily Living In your present state of health, do you have any difficulty performing the following activities: 12/24/2019  Hearing? N  Vision? N  Difficulty concentrating or making decisions? N   Walking or climbing stairs? Y  Dressing or bathing? N  Doing errands, shopping? N  Preparing Food and eating ? N  Using the Toilet? N  In the past six months, have you accidently leaked urine? Y  Comment Managed with daily liner as needed  Do you have problems with loss of bowel control? N  Managing your Medications? N  Managing your Finances? N  Housekeeping or managing your Housekeeping? N  Some recent data might be hidden    Patient Care Team: [name], MD as PCP - General (Internal Medicine) [name], MD (Internal Medicine) Bary Castilla Forest Gleason, MD (General Surgery)    Assessment:  This is a routine wellness examination for Kadisha.  Nurse connected with patient 12/24/19 at 11:30 AM EST by a telephone enabled telemedicine application and verified that I am speaking with the correct person using two identifiers. Patient stated full name and DOB. Patient gave permission to continue with virtual visit. Patient's location was at home and Nurse's location was at [place] office.   Patient is alert and oriented x3. Patient denies difficulty focusing or concentrating. Patient likes to word search puzzles for brain stimulation.   Health Maintenance Due: -Mammogram- plans to schedule; number provided to [facility].  See completed HM at the end of note.   Eye: Visual acuity not assessed. Virtual visit. Followed by their ophthalmologist.  Dental: Visits every 6 months.    Hearing: Demonstrates normal hearing during visit.  Safety:  Patient feels safe at home- yes Patient does have smoke detectors at home- yes Patient does wear sunscreen or protective clothing when in direct sunlight - yes Patient does wear seat belt when in a moving vehicle - yes Patient drives- yes Adequate lighting in walkways free from debris- yes Grab bars and handrails used as appropriate- yes Ambulates with an assistive device- no Cell phone on person when ambulating outside of the home-  yes  Social: Alcohol intake - no      Smoking history- never   Smokers in home? none Illicit drug use? none  Medication: Taking as directed and without issues.  Pill box in use -yes  Self managed - yes  Covid-19: Precautions and sickness symptoms discussed. Wears mask, social distancing, hand hygiene as appropriate.   Activities of Daily Living Patient denies needing assistance with: household chores, feeding themselves, getting from bed to chair, getting to the toilet, bathing/showering, dressing, managing money, or preparing meals.   Discussed the importance of a healthy diet, water intake and the benefits of aerobic exercise.  Educational material provided.  Physical activity- walking, as tolerated  Diet:  Regular Water: fair intake Caffeine: none  Other Providers Patient Care Team: [name], MD as PCP - General (Internal Medicine) [name], MD (Internal Medicine) Bary Castilla Forest Gleason, MD (General Surgery)  Exercise Activities and Dietary recommendations Current Exercise Habits: Home exercise routine, Type of exercise: walking, Frequency (Times/Week): 2, Intensity: Mild  Goals      Patient Stated   .  Maintain Healthy Lifestyle (pt-stated)     Meniscus surgery after thyroid levels are stable.        Fall Risk Fall Risk  12/24/2019 05/04/2019 10/26/2018 10/26/2017 07/25/2017  Falls in the past year? 0 0 No No No  Number falls in past yr: - 0 - - -  Follow up Falls prevention discussed Falls evaluation completed - - -   Timed Get Up and Go performed: no, virtual visit  Depression Screen PHQ 2/9 Scores 12/24/2019 05/04/2019 10/26/2018 10/26/2017  PHQ - 2 Score 0 0 0 0  PHQ- 9 Score - 2 - -  Exception Documentation - - - -     Cognitive Function MMSE - Mini Mental State Exam 12/18/2018 10/26/2017  Orientation to time 5 5  Orientation to Place 5 5  Registration 3 3  Attention/ Calculation 5 5  Recall 3 3  Language- name 2 objects 2 2  Language-  repeat 1 1  Language- follow 3 step command 3 3  Language- read & follow direction 1 1  Write a sentence 1 1  Copy design 1 1  Total score 30 30  6CIT Screen 12/24/2019  What Year? 0 points  What month? 0 points  What time? 0 points  Count back from 20 0 points  Months in reverse 0 points  Repeat phrase 0 points  Total Score 0    Immunization History  Administered Date(s) Administered  . Fluad Quad(high Dose 65+) 08/23/2019  . Influenza, High Dose Seasonal PF 09/26/2018  . Pneumococcal Conjugate-13 11/09/2017  . Pneumococcal Polysaccharide-23 03/02/2013, 01/30/2019  . Tdap 04/10/2014   Screening Tests Health Maintenance  Topic Date Due  . MAMMOGRAM  12/23/2020 (Originally 12/01/2019)  . TETANUS/TDAP  04/10/2024  . COLONOSCOPY  05/07/2024  . INFLUENZA VACCINE  Completed  . DEXA SCAN  Completed  . Hepatitis C Screening  Completed  . PNA vac Low Risk Adult  Completed      Plan:    Keep all routine maintenance appointments.   Cpe 02/28/20 @ 830  Medicare Attestation I have personally reviewed: The patient's medical and social history Their use of alcohol, tobacco or illicit drugs Their current medications and supplements The patient's functional ability including ADLs,fall risks, home safety risks, cognitive, and hearing and visual impairment Diet and physical activities Evidence for depression   I have reviewed and discussed with patient certain preventive protocols, quality metrics, and best practice recommendations.     OBrien-Blaney, Cherrelle Plante L, LPN  [phone]     I have reviewed the above information and agree with above.   Deborra Medina, MD

## 2019-12-24 NOTE — Patient Instructions (Addendum)
  Katelyn Friedman , Thank you for taking time to come for your Medicare Wellness Visit. I appreciate your ongoing commitment to your health goals. Please review the following plan we discussed and let me know if I can assist you in the future.   These are the goals we discussed: Goals      Patient Stated   .  Maintain Healthy Lifestyle (pt-stated)     Meniscus surgery after thyroid levels are stable.        This is a list of the screening recommended for you and due dates:  Health Maintenance  Topic Date Due  . Mammogram  12/01/2019  . Tetanus Vaccine  04/10/2024  . Colon Cancer Screening  05/07/2024  . Flu Shot  Completed  . DEXA scan (bone density measurement)  Completed  .  Hepatitis C: One time screening is recommended by Center for Disease Control  (CDC) for  adults born from [birth year] through 1966.   Completed  . Pneumonia vaccines  Completed

## 2020-01-21 DIAGNOSIS — Z20828 Contact with and (suspected) exposure to other viral communicable diseases: Secondary | ICD-10-CM | POA: Diagnosis not present

## 2020-01-21 DIAGNOSIS — Z20822 Contact with and (suspected) exposure to covid-19: Secondary | ICD-10-CM | POA: Diagnosis not present

## 2020-02-08 DIAGNOSIS — E890 Postprocedural hypothyroidism: Secondary | ICD-10-CM | POA: Diagnosis not present

## 2020-02-25 ENCOUNTER — Other Ambulatory Visit: Payer: Self-pay | Admitting: Internal Medicine

## 2020-02-25 DIAGNOSIS — Z1231 Encounter for screening mammogram for malignant neoplasm of breast: Secondary | ICD-10-CM

## 2020-02-28 ENCOUNTER — Encounter: Payer: PPO | Admitting: Internal Medicine

## 2020-03-11 DIAGNOSIS — R002 Palpitations: Secondary | ICD-10-CM | POA: Diagnosis not present

## 2020-03-11 DIAGNOSIS — K219 Gastro-esophageal reflux disease without esophagitis: Secondary | ICD-10-CM | POA: Diagnosis not present

## 2020-03-11 DIAGNOSIS — E890 Postprocedural hypothyroidism: Secondary | ICD-10-CM | POA: Diagnosis not present

## 2020-03-19 ENCOUNTER — Encounter: Payer: Self-pay | Admitting: Internal Medicine

## 2020-03-19 ENCOUNTER — Ambulatory Visit (INDEPENDENT_AMBULATORY_CARE_PROVIDER_SITE_OTHER): Payer: PPO | Admitting: Internal Medicine

## 2020-03-19 ENCOUNTER — Other Ambulatory Visit: Payer: Self-pay

## 2020-03-19 VITALS — BP 126/82 | HR 61 | Temp 97.1°F | Resp 15 | Ht 63.0 in | Wt 175.8 lb

## 2020-03-19 DIAGNOSIS — E785 Hyperlipidemia, unspecified: Secondary | ICD-10-CM | POA: Diagnosis not present

## 2020-03-19 DIAGNOSIS — R7301 Impaired fasting glucose: Secondary | ICD-10-CM | POA: Diagnosis not present

## 2020-03-19 DIAGNOSIS — Z0000 Encounter for general adult medical examination without abnormal findings: Secondary | ICD-10-CM | POA: Diagnosis not present

## 2020-03-19 DIAGNOSIS — M545 Low back pain, unspecified: Secondary | ICD-10-CM

## 2020-03-19 DIAGNOSIS — R1031 Right lower quadrant pain: Secondary | ICD-10-CM

## 2020-03-19 DIAGNOSIS — Z780 Asymptomatic menopausal state: Secondary | ICD-10-CM

## 2020-03-19 DIAGNOSIS — R195 Other fecal abnormalities: Secondary | ICD-10-CM

## 2020-03-19 LAB — COMPREHENSIVE METABOLIC PANEL
ALT: 10 U/L (ref 0–35)
AST: 17 U/L (ref 0–37)
Albumin: 4.2 g/dL (ref 3.5–5.2)
Alkaline Phosphatase: 57 U/L (ref 39–117)
BUN: 15 mg/dL (ref 6–23)
CO2: 27 mEq/L (ref 19–32)
Calcium: 9.1 mg/dL (ref 8.4–10.5)
Chloride: 105 mEq/L (ref 96–112)
Creatinine, Ser: 0.88 mg/dL (ref 0.40–1.20)
GFR: 76.93 mL/min (ref >60.00)
Glucose, Bld: 86 mg/dL (ref 70–99)
Potassium: 4.1 mEq/L (ref 3.5–5.1)
Sodium: 138 mEq/L (ref 135–145)
Total Bilirubin: 0.8 mg/dL (ref 0.2–1.2)
Total Protein: 6.9 g/dL (ref 6.0–8.3)

## 2020-03-19 LAB — LIPID PANEL
Cholesterol: 193 mg/dL (ref 0–200)
HDL: 62.3 mg/dL (ref >39.00)
LDL Cholesterol: 117 mg/dL — ABNORMAL HIGH (ref 0–99)
NonHDL: 130.91
Total CHOL/HDL Ratio: 3
Triglycerides: 72 mg/dL (ref 0.0–149.0)
VLDL: 14.4 mg/dL (ref 0.0–40.0)

## 2020-03-19 LAB — CBC WITH DIFFERENTIAL/PLATELET
Basophils Absolute: 0.1 10*3/uL (ref 0.0–0.1)
Basophils Relative: 0.8 % (ref 0.0–3.0)
Eosinophils Absolute: 0.1 10*3/uL (ref 0.0–0.7)
Eosinophils Relative: 1.2 % (ref 0.0–5.0)
HCT: 39.6 % (ref 36.0–46.0)
Hemoglobin: 12.9 g/dL (ref 12.0–15.0)
Lymphocytes Relative: 29.1 % (ref 12.0–46.0)
Lymphs Abs: 2 10*3/uL (ref 0.7–4.0)
MCHC: 32.5 g/dL (ref 30.0–36.0)
MCV: 87.2 fl (ref 78.0–100.0)
Monocytes Absolute: 0.4 10*3/uL (ref 0.1–1.0)
Monocytes Relative: 6.2 % (ref 3.0–12.0)
Neutro Abs: 4.3 10*3/uL (ref 1.4–7.7)
Neutrophils Relative %: 62.7 % (ref 43.0–77.0)
Platelets: 249 10*3/uL (ref 150.0–400.0)
RBC: 4.54 Mil/uL (ref 3.87–5.11)
RDW: 16.4 % — ABNORMAL HIGH (ref 11.5–15.5)
WBC: 6.8 10*3/uL (ref 4.0–10.5)

## 2020-03-19 LAB — POC HEMOCCULT BLD/STL (OFFICE/1-CARD/DIAGNOSTIC): Card #1 Date: NEGATIVE

## 2020-03-19 LAB — HEMOGLOBIN A1C: Hgb A1c MFr Bld: 5.9 % (ref 4.6–6.5)

## 2020-03-19 NOTE — Progress Notes (Signed)
Patient ID: Katelyn Friedman, female    DOB: [birth year]  Age: 69 y.o. MRN: [id number]  The patient is here for annual preventive examination and management of other chronic and acute problems.  This visit occurred during the SARS-CoV-2 public health emergency.  Safety protocols were in place, including screening questions prior to the visit, additional usage of staff PPE, and extensive cleaning of exam room while observing appropriate contact time as indicated for disinfecting solutions.     The risk factors are reflected in the social history.  The roster of all physicians providing medical care to patient - is listed in the Snapshot section of the chart.  Activities of daily living:  The patient is 100% independent in all ADLs: dressing, toileting, feeding as well as independent mobility  Home safety : The patient has smoke detectors in the home. They wear seatbelts.  There are no firearms at home. There is no violence in the home.   There is no risks for hepatitis, STDs or HIV. There is no   history of blood transfusion. They have no travel history to infectious disease endemic areas of the world.  The patient has seen their dentist in the last six month. They have seen their eye doctor in the last year. They admit to slight hearing difficulty with regard to whispered voices and some television programs.  They have deferred audiologic testing in the last year.  They do not  have excessive sun exposure. Discussed the need for sun protection: hats, long sleeves and use of sunscreen if there is significant sun exposure.   Diet: the importance of a healthy diet is discussed. They do have a healthy diet.  The benefits of regular aerobic exercise were discussed. She walks 4 times per week ,  20 minutes.   Depression screen: there are no signs or vegative symptoms of depression- irritability, change in appetite, anhedonia, sadness/tearfullness.  Cognitive assessment: the patient manages all their  financial and personal affairs and is actively engaged. They could relate day,date,year and events; recalled 2/3 objects at 3 minutes; performed clock-face test normally.  The following portions of the patient's history were reviewed and updated as appropriate: allergies, current medications, past family history, past medical history,  past surgical history, past social history  and problem list.  Visual acuity was not assessed per patient preference since she has regular follow up with her ophthalmologist. Hearing and body mass index were assessed and reviewed.   During the course of the visit the patient was educated and counseled about appropriate screening and preventive services including : fall prevention , diabetes screening, nutrition counseling, colorectal cancer screening, and recommended immunizations.    CC: There were no encounter diagnoses.  Low back pain, numbness in toes, feeling off balance etc  saw Manuella Ghazi: normal lumbar MRI,  Cervical spine MRi ordered but  Not done/   Stools have been black for the past 3 weeks,   Has RLQ pain , intermittent,  Feels like a toothache,  Gets progressively worse,  Not sure if it is relieved with a BM. Moves bowels every other day.  Tylenol helps.  Denies hematochezia, hematuria.  Pain lasts an hour or so.  Random In occurrence   Has had a cholecystectomy.  Not sexually ctive.  No change in appetite or weight . Last colonoscopy was 2015,  One hyperplastic polyp removed   Had thyroid labs 2 weeks ago  Dose of synthroid  increased to 75 mcg 4 days  50 mcg 3  days per week    History Dylan has a past medical history of Allergic rhinitis, Arthritis, Family history of adverse reaction to anesthesia, GERD (gastroesophageal reflux disease), Herpes simplex, History of colon polyps, IBS (irritable bowel syndrome), PONV (postoperative nausea and vomiting), and Thyroid disease.   She has a past surgical history that includes Rotator cuff repair (2006); Knee  arthroscopy (1992); Tubal ligation; Tonsillectomy; Colonoscopy (05/07/2014); Breast biopsy (Right, 2014); Knee arthroscopy (Right, 06/08/2016); and Cholecystectomy (N/A, 09/21/2017).   Her family history includes Alzheimer's disease in her mother; Cancer in her maternal aunt; Colon polyps in her maternal aunt; Hypertension in her mother; Stroke in her mother.She reports that she has never smoked. She has never used smokeless tobacco. She reports that she does not drink alcohol or use drugs.  Outpatient Medications Prior to Visit  Medication Sig Dispense Refill  . acetaminophen (TYLENOL) 500 MG tablet Take 500-1,000 mg by mouth every 6 (six) hours as needed (for pain/headaches.).    [name] acyclovir (ZOVIRAX) 400 MG tablet Take 1 tablet (400 mg total) by mouth daily. 90 tablet 3  . albuterol (PROAIR HFA) 108 (90 Base) MCG/ACT inhaler Inhale 1-2 puffs into the lungs every 6 (six) hours as needed for wheezing or shortness of breath. 1 Inhaler 2  . Cholecalciferol (VITAMIN D) 2000 units tablet Take 2,000 Units by mouth daily.    [name] levothyroxine (SYNTHROID) 50 MCG tablet Take 50 mcg by mouth daily before breakfast. Pt is taking 50 mg 3 days a week    . levothyroxine (SYNTHROID) 75 MCG tablet Take 75 mcg by mouth daily before breakfast. Pt is taking 75 mg 4 days a week    . levothyroxine (SYNTHROID, LEVOTHROID) 88 MCG tablet Take 1 tablet (88 mcg total) by mouth daily before breakfast. (Patient not taking: Reported on 03/19/2020) 90 tablet 1   No facility-administered medications prior to visit.    Review of Systems   Patient denies headache, fevers, malaise, unintentional weight loss, skin rash, eye pain, sinus congestion and sinus pain, sore throat, dysphagia,  hemoptysis , cough, dyspnea, wheezing, chest pain, palpitations, orthopnea, edema,  pain, nausea,  diarrhea, constipation,dysuria, hematuria, urinary  Frequency, nocturia, numbness, tingling, seizures,  Focal weakness, Loss of consciousness,  Tremor,  insomnia, depression, anxiety, and suicidal ideation.      Objective:  BP 126/82 (BP Location: Left Arm, Patient Position: Sitting, Cuff Size: Normal)   Pulse 61   Temp (!) 97.1 F (36.2 C) (Temporal)   Resp 15   Ht 5\' 3"  (1.6 m)   Wt 175 lb 12.8 oz (79.7 kg)   SpO2 99%   BMI 31.14 kg/m   Physical Exam   General appearance: alert, cooperative and appears stated age Head: Normocephalic, without obvious abnormality, atraumatic Eyes: conjunctivae/corneas clear. PERRL, EOM's intact. Fundi benign. Ears: normal TM's and external ear canals both ears Nose: Nares normal. Septum midline. Mucosa normal. No drainage or sinus tenderness. Throat: lips, mucosa, and tongue normal; teeth and gums normal Neck: no adenopathy, no carotid bruit, no JVD, supple, symmetrical, trachea midline and thyroid not enlarged, symmetric, no tenderness/mass/nodules Lungs: clear to auscultation bilaterally Breasts: normal appearance, no masses or tenderness Heart: regular rate and rhythm, S1, S2 normal, no murmur, click, rub or gallop Abdomen: soft, non-tender; bowel sounds normal; no masses,  no organomegaly Rectal:  Normal sphincter tone,  Stool brown,  guaiac negative  Extremities: extremities normal, atraumatic, no cyanosis or edema Pulses: 2+ and symmetric Skin: Skin color, texture, turgor normal. No rashes or lesions Neurologic:  Alert and oriented X 3, normal strength and tone. Normal symmetric reflexes. Normal coordination and gait.      Assessment & Plan:   Problem List Items Addressed This Visit    None      I am having Katelyn Friedman maintain her Vitamin D, acetaminophen, albuterol, acyclovir, levothyroxine, and levothyroxine.  No orders of the defined types were placed in this encounter.   Medications Discontinued During This Encounter  Medication Reason  . levothyroxine (SYNTHROID, LEVOTHROID) 88 MCG tablet Change in therapy    Follow-up: No follow-ups on file.   [name], MD

## 2020-03-19 NOTE — Patient Instructions (Addendum)
Your in office stool test was normal.  I want you to do 3 more at home to confirm that there is no blood in your stool  DEXA scan has been ordered.  Your lower abdominal pain may be from constipation.  I recommend using miralax or citrucel on a regular basis (take at night in water) to see if improving your regularity makes a difference.    To prevent gas:  Take Beano with any meal containing legumes (hard beans0,  Or cruciferous vegetables  (broccoli, cauliflower, brussel sprouts)  Take Lactase with any meal containing  dairy  You can try Phasyme or Gas X for gas that occurs after a meal     Health Maintenance After Age 6 After age 70, you are at a higher risk for certain long-term diseases and infections as well as injuries from falls. Falls are a major cause of broken bones and head injuries in people who are older than age 101. Getting regular preventive care can help to keep you healthy and well. Preventive care includes getting regular testing and making lifestyle changes as recommended by your health care provider. Talk with your health care provider about:  Which screenings and tests you should have. A screening is a test that checks for a disease when you have no symptoms.  A diet and exercise plan that is right for you. What should I know about screenings and tests to prevent falls? Screening and testing are the best ways to find a health problem early. Early diagnosis and treatment give you the best chance of managing medical conditions that are common after age 43. Certain conditions and lifestyle choices may make you more likely to have a fall. Your health care provider may recommend:  Regular vision checks. Poor vision and conditions such as cataracts can make you more likely to have a fall. If you wear glasses, make sure to get your prescription updated if your vision changes.  Medicine review. Work with your health care provider to regularly review all of the  medicines you are taking, including over-the-counter medicines. Ask your health care provider about any side effects that may make you more likely to have a fall. Tell your health care provider if any medicines that you take make you feel dizzy or sleepy.  Osteoporosis screening. Osteoporosis is a condition that causes the bones to get weaker. This can make the bones weak and cause them to break more easily.  Blood pressure screening. Blood pressure changes and medicines to control blood pressure can make you feel dizzy.  Strength and balance checks. Your health care provider may recommend certain tests to check your strength and balance while standing, walking, or changing positions.  Foot health exam. Foot pain and numbness, as well as not wearing proper footwear, can make you more likely to have a fall.  Depression screening. You may be more likely to have a fall if you have a fear of falling, feel emotionally low, or feel unable to do activities that you used to do.  Alcohol use screening. Using too much alcohol can affect your balance and may make you more likely to have a fall. What actions can I take to lower my risk of falls? General instructions  Talk with your health care provider about your risks for falling. Tell your health care provider if: ? You fall. Be sure to tell your health care provider about all falls, even ones that seem minor. ? You feel dizzy, sleepy, or off-balance.  Take  over-the-counter and prescription medicines only as told by your health care provider. These include any supplements.  Eat a healthy diet and maintain a healthy weight. A healthy diet includes low-fat dairy products, low-fat (lean) meats, and fiber from whole grains, beans, and lots of fruits and vegetables. Home safety  Remove any tripping hazards, such as rugs, cords, and clutter.  Install safety equipment such as grab bars in bathrooms and safety rails on stairs.  Keep rooms and walkways  well-lit. Activity   Follow a regular exercise program to stay fit. This will help you maintain your balance. Ask your health care provider what types of exercise are appropriate for you.  If you need a cane or walker, use it as recommended by your health care provider.  Wear supportive shoes that have nonskid soles. Lifestyle  Do not drink alcohol if your health care provider tells you not to drink.  If you drink alcohol, limit how much you have: ? 0-1 drink a day for women. ? 0-2 drinks a day for men.  Be aware of how much alcohol is in your drink. In the [place] one drink equals one typical bottle of beer (12 oz), one-half glass of wine (5 oz), or one shot of hard liquor (1 oz).  Do not use any products that contain nicotine or tobacco, such as cigarettes and e-cigarettes. If you need help quitting, ask your health care provider. Summary  Having a healthy lifestyle and getting preventive care can help to protect your health and wellness after age 80.  Screening and testing are the best way to find a health problem early and help you avoid having a fall. Early diagnosis and treatment give you the best chance for managing medical conditions that are more common for people who are older than age 63.  Falls are a major cause of broken bones and head injuries in people who are older than age 86. Take precautions to prevent a fall at home.  Work with your health care provider to learn what changes you can make to improve your health and wellness and to prevent falls. This information is not intended to replace advice given to you by your health care provider. Make sure you discuss any questions you have with your health care provider. Document Revised: 04/05/2019 Document Reviewed: 10/26/2017 Elsevier Patient Education  2020 [employer].

## 2020-03-21 ENCOUNTER — Other Ambulatory Visit: Payer: Self-pay | Admitting: Internal Medicine

## 2020-03-22 DIAGNOSIS — Z0000 Encounter for general adult medical examination without abnormal findings: Secondary | ICD-10-CM | POA: Insufficient documentation

## 2020-03-22 NOTE — Assessment & Plan Note (Signed)

## 2020-03-22 NOTE — Assessment & Plan Note (Signed)
Recurrent, chronic, intermittent.  Etiology likely due to constipation, given guaiac negative stool and lack of warning signs (no weight loss or appetite changes)

## 2020-03-25 ENCOUNTER — Ambulatory Visit
Admission: RE | Admit: 2020-03-25 | Discharge: 2020-03-25 | Disposition: A | Discharge status: Home / Self Care | Payer: PPO | Source: Ambulatory Visit | Attending: Internal Medicine | Admitting: Internal Medicine

## 2020-03-25 DIAGNOSIS — Z1231 Encounter for screening mammogram for malignant neoplasm of breast: Secondary | ICD-10-CM | POA: Insufficient documentation

## 2020-04-02 ENCOUNTER — Telehealth: Payer: Self-pay | Admitting: Internal Medicine

## 2020-04-02 NOTE — Telephone Encounter (Signed)
Patient is requesting a stool sample and is coming into office tomorrow afternoon to pick up cards.

## 2020-04-02 NOTE — Telephone Encounter (Signed)
Cards have been placed up front for pt to come by and pick up.

## 2020-05-08 ENCOUNTER — Encounter: Payer: Self-pay | Admitting: Internal Medicine

## 2020-06-04 DIAGNOSIS — H2511 Age-related nuclear cataract, right eye: Secondary | ICD-10-CM | POA: Diagnosis not present

## 2020-06-11 DIAGNOSIS — R002 Palpitations: Secondary | ICD-10-CM | POA: Diagnosis not present

## 2020-06-11 DIAGNOSIS — E890 Postprocedural hypothyroidism: Secondary | ICD-10-CM | POA: Diagnosis not present

## 2020-06-18 ENCOUNTER — Telehealth: Payer: Self-pay

## 2020-06-18 ENCOUNTER — Telehealth: Payer: Self-pay | Admitting: Internal Medicine

## 2020-06-18 NOTE — Telephone Encounter (Signed)
See other telephone encounter from today

## 2020-06-18 NOTE — Telephone Encounter (Signed)
Please call pt back.
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
## 2020-06-18 NOTE — Telephone Encounter (Signed)
Pt called to state that she just left the [facility] and wanted to see if she could be seen sooner than Monday. Pt has been scheduled for tomorrow with Dr. Derrel Nip at 11;30am. Pt is aware of appt date and time.
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
## 2020-06-19 ENCOUNTER — Encounter: Payer: Self-pay | Admitting: Internal Medicine

## 2020-06-19 ENCOUNTER — Ambulatory Visit (INDEPENDENT_AMBULATORY_CARE_PROVIDER_SITE_OTHER): Payer: PPO | Admitting: Internal Medicine

## 2020-06-19 VITALS — BP 120/70 | HR 72 | Temp 97.4°F | Resp 16 | Ht 63.0 in | Wt 158.8 lb

## 2020-06-19 DIAGNOSIS — F411 Generalized anxiety disorder: Secondary | ICD-10-CM

## 2020-06-19 DIAGNOSIS — R7303 Prediabetes: Secondary | ICD-10-CM | POA: Diagnosis not present

## 2020-06-19 DIAGNOSIS — F430 Acute stress reaction: Secondary | ICD-10-CM | POA: Diagnosis not present

## 2020-06-19 LAB — COMPREHENSIVE METABOLIC PANEL
ALT: 11 U/L (ref 0–35)
AST: 18 U/L (ref 0–37)
Albumin: 4.7 g/dL (ref 3.5–5.2)
Alkaline Phosphatase: 62 U/L (ref 39–117)
BUN: 14 mg/dL (ref 6–23)
CO2: 25 mEq/L (ref 19–32)
Calcium: 10 mg/dL (ref 8.4–10.5)
Chloride: 104 mEq/L (ref 96–112)
Creatinine, Ser: 1.02 mg/dL (ref 0.40–1.20)
GFR: 64.83 mL/min (ref >60.00)
Glucose, Bld: 90 mg/dL (ref 70–99)
Potassium: 4.8 mEq/L (ref 3.5–5.1)
Sodium: 137 mEq/L (ref 135–145)
Total Bilirubin: 0.8 mg/dL (ref 0.2–1.2)
Total Protein: 7.2 g/dL (ref 6.0–8.3)

## 2020-06-19 LAB — LIPID PANEL
Cholesterol: 213 mg/dL — ABNORMAL HIGH (ref 0–200)
HDL: 58.5 mg/dL (ref >39.00)
LDL Cholesterol: 131 mg/dL — ABNORMAL HIGH (ref 0–99)
NonHDL: 154.35
Total CHOL/HDL Ratio: 4
Triglycerides: 116 mg/dL (ref 0.0–149.0)
VLDL: 23.2 mg/dL (ref 0.0–40.0)

## 2020-06-19 LAB — HEMOGLOBIN A1C: Hgb A1c MFr Bld: 6 % (ref 4.6–6.5)

## 2020-06-19 MED ORDER — ALPRAZOLAM 0.25 MG PO TABS: 0.2500 mg | ORAL_TABLET | Freq: Two times a day (BID) | ORAL | 3 refills | Status: AC | PRN
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
## 2020-06-20 DIAGNOSIS — F411 Generalized anxiety disorder: Secondary | ICD-10-CM | POA: Insufficient documentation

## 2020-06-20 NOTE — Assessment & Plan Note (Signed)
Prescribing alprazolam for prn use not more than 2 times daily The risks and benefits of benzodiazepine use were discussed with patient today including excessive sedation leading to respiratory depression,  impaired thinking/driving, and addiction.  Patient was advised to avoid concurrent use with alcohol, to use medication only as needed and not to share with others  .

## 2020-06-23 ENCOUNTER — Ambulatory Visit: Payer: PPO | Admitting: Internal Medicine

## 2020-07-01 ENCOUNTER — Telehealth: Payer: Self-pay | Admitting: Internal Medicine

## 2020-07-01 NOTE — Telephone Encounter (Signed)
Pt needs a copy of her last clinic notes to pick up

## 2020-07-01 NOTE — Telephone Encounter (Signed)
LMTCB

## 2020-07-03 NOTE — Telephone Encounter (Signed)
Spoke with pt and she stated that she came by yesterday and got a copy of her office note.

## 2020-09-15 ENCOUNTER — Other Ambulatory Visit: Payer: Self-pay

## 2020-09-15 ENCOUNTER — Ambulatory Visit (INDEPENDENT_AMBULATORY_CARE_PROVIDER_SITE_OTHER): Payer: PPO

## 2020-09-15 DIAGNOSIS — Z23 Encounter for immunization: Secondary | ICD-10-CM

## 2020-12-24 ENCOUNTER — Telehealth: Payer: Self-pay

## 2020-12-24 ENCOUNTER — Ambulatory Visit: Payer: PPO

## 2020-12-24 NOTE — Telephone Encounter (Signed)
Unsuccessful attempt to reach patient for scheduled awv. Left message to call office back and reschedule.

## 2020-12-25 ENCOUNTER — Ambulatory Visit (INDEPENDENT_AMBULATORY_CARE_PROVIDER_SITE_OTHER): Payer: PPO

## 2020-12-25 ENCOUNTER — Telehealth: Payer: Self-pay

## 2020-12-25 VITALS — Ht 63.0 in | Wt 158.0 lb

## 2020-12-25 DIAGNOSIS — Z0000 Encounter for general adult medical examination without abnormal findings: Secondary | ICD-10-CM

## 2020-12-25 NOTE — Telephone Encounter (Signed)
No answer when patient was called for scheduled awv on preferred number. Reschedule as appropriate.

## 2020-12-25 NOTE — Patient Instructions (Addendum)
Katelyn Friedman , Thank you for taking time to come for your Medicare Wellness Visit. I appreciate your ongoing commitment to your health goals. Please review the following plan we discussed and let me know if I can assist you in the future.   These are the goals we discussed: Goals      Patient Stated   .   Maintain Healthy Lifestyle (pt-stated)      Stay active Healthy diet       This is a list of the screening recommended for you and due dates:  Health Maintenance  Topic Date Due  . COVID-19 Vaccine (3 - Booster) 07/29/2020  . Mammogram  03/25/2022  . Tetanus Vaccine  04/10/2024  . Colon Cancer Screening  05/07/2024  . Flu Shot  Completed  . DEXA scan (bone density measurement)  Completed  .  Hepatitis C: One time screening is recommended by Center for Disease Control  (CDC) for  adults born from [birth year] through 1966.   Completed  . Pneumonia vaccines  Completed   Immunizations Immunization History  Administered Date(s) Administered  . Fluad Quad(high Dose 65+) 08/23/2019, 09/15/2020  . Influenza, High Dose Seasonal PF 09/26/2018  . PFIZER SARS-COV-2 Vaccination 01/19/2020, 01/30/2020  . Pneumococcal Conjugate-13 11/09/2017  . Pneumococcal Polysaccharide-23 03/02/2013, 01/30/2019  . Tdap 04/10/2014   Keep all routine maintenance appointments.   Cpe 03/20/21 @ 3:00  Advanced directives:   Conditions/risks identified: none new.   Follow up in one year for your annual wellness visit.   Preventive Care 70 Years and Older, Female Preventive care refers to lifestyle choices and visits with your health care provider that can promote health and wellness. What does preventive care include?  A yearly physical exam. This is also called an annual well check.  Dental exams once or twice a year.  Routine eye exams. Ask your health care provider how often you should have your eyes checked.  Personal lifestyle choices, including:  Daily care of your teeth and gums.  Regular  physical activity.  Eating a healthy diet.  Avoiding tobacco and drug use.  Limiting alcohol use.  Practicing safe sex.  Taking low-dose aspirin every day.  Taking vitamin and mineral supplements as recommended by your health care provider. What happens during an annual well check? The services and screenings done by your health care provider during your annual well check will depend on your age, overall health, lifestyle risk factors, and family history of disease. Counseling  Your health care provider may ask you questions about your:  Alcohol use.  Tobacco use.  Drug use.  Emotional well-being.  Home and relationship well-being.  Sexual activity.  Eating habits.  History of falls.  Memory and ability to understand (cognition).  Work and work Astronomer.  Reproductive health. Screening  You may have the following tests or measurements:  Height, weight, and BMI.  Blood pressure.  Lipid and cholesterol levels. These may be checked every 5 years, or more frequently if you are over 59 years old.  Skin check.  Lung cancer screening. You may have this screening every year starting at age 65 if you have a 30-pack-year history of smoking and currently smoke or have quit within the past 15 years.  Fecal occult blood test (FOBT) of the stool. You may have this test every year starting at age 105.  Flexible sigmoidoscopy or colonoscopy. You may have a sigmoidoscopy every 5 years or a colonoscopy every 10 years starting at age 49.  Hepatitis C  blood test.  Hepatitis B blood test.  Sexually transmitted disease (STD) testing.  Diabetes screening. This is done by checking your blood sugar (glucose) after you have not eaten for a while (fasting). You may have this done every 1-3 years.  Bone density scan. This is done to screen for osteoporosis. You may have this done starting at age 36.  Mammogram. This may be done every 1-2 years. Talk to your health care  provider about how often you should have regular mammograms. Talk with your health care provider about your test results, treatment options, and if necessary, the need for more tests. Vaccines  Your health care provider may recommend certain vaccines, such as:  Influenza vaccine. This is recommended every year.  Tetanus, diphtheria, and acellular pertussis (Tdap, Td) vaccine. You may need a Td booster every 10 years.  Zoster vaccine. You may need this after age 44.  Pneumococcal 13-valent conjugate (PCV13) vaccine. One dose is recommended after age 2.  Pneumococcal polysaccharide (PPSV23) vaccine. One dose is recommended after age 3. Talk to your health care provider about which screenings and vaccines you need and how often you need them. This information is not intended to replace advice given to you by your health care provider. Make sure you discuss any questions you have with your health care provider. Document Released: 01/09/2016 Document Revised: 09/01/2016 Document Reviewed: 10/14/2015 Elsevier Interactive Patient Education  2017 [place] Prevention in the Home Falls can cause injuries. They can happen to people of all ages. There are many things you can do to make your home safe and to help prevent falls. What can I do on the outside of my home?  Regularly fix the edges of walkways and driveways and fix any cracks.  Remove anything that might make you trip as you walk through a door, such as a raised step or threshold.  Trim any bushes or trees on the path to your home.  Use bright outdoor lighting.  Clear any walking paths of anything that might make someone trip, such as rocks or tools.  Regularly check to see if handrails are loose or broken. Make sure that both sides of any steps have handrails.  Any raised decks and porches should have guardrails on the edges.  Have any leaves, snow, or ice cleared regularly.  Use sand or salt on walking paths  during winter.  Clean up any spills in your garage right away. This includes oil or grease spills. What can I do in the bathroom?  Use night lights.  Install grab bars by the toilet and in the tub and shower. Do not use towel bars as grab bars.  Use non-skid mats or decals in the tub or shower.  If you need to sit down in the shower, use a plastic, non-slip stool.  Keep the floor dry. Clean up any water that spills on the floor as soon as it happens.  Remove soap buildup in the tub or shower regularly.  Attach bath mats securely with double-sided non-slip rug tape.  Do not have throw rugs and other things on the floor that can make you trip. What can I do in the bedroom?  Use night lights.  Make sure that you have a light by your bed that is easy to reach.  Do not use any sheets or blankets that are too big for your bed. They should not hang down onto the floor.  Have a firm chair that has side arms. You  can use this for support while you get dressed.  Do not have throw rugs and other things on the floor that can make you trip. What can I do in the kitchen?  Clean up any spills right away.  Avoid walking on wet floors.  Keep items that you use a lot in easy-to-reach places.  If you need to reach something above you, use a strong step stool that has a grab bar.  Keep electrical cords out of the way.  Do not use floor polish or wax that makes floors slippery. If you must use wax, use non-skid floor wax.  Do not have throw rugs and other things on the floor that can make you trip. What can I do with my stairs?  Do not leave any items on the stairs.  Make sure that there are handrails on both sides of the stairs and use them. Fix handrails that are broken or loose. Make sure that handrails are as long as the stairways.  Check any carpeting to make sure that it is firmly attached to the stairs. Fix any carpet that is loose or worn.  Avoid having throw rugs at the top  or bottom of the stairs. If you do have throw rugs, attach them to the floor with carpet tape.  Make sure that you have a light switch at the top of the stairs and the bottom of the stairs. If you do not have them, ask someone to add them for you. What else can I do to help prevent falls?  Wear shoes that:  Do not have high heels.  Have rubber bottoms.  Are comfortable and fit you well.  Are closed at the toe. Do not wear sandals.  If you use a stepladder:  Make sure that it is fully opened. Do not climb a closed stepladder.  Make sure that both sides of the stepladder are locked into place.  Ask someone to hold it for you, if possible.  Clearly mark and make sure that you can see:  Any grab bars or handrails.  First and last steps.  Where the edge of each step is.  Use tools that help you move around (mobility aids) if they are needed. These include:  Canes.  Walkers.  Scooters.  Crutches.  Turn on the lights when you go into a dark area. Replace any light bulbs as soon as they burn out.  Set up your furniture so you have a clear path. Avoid moving your furniture around.  If any of your floors are uneven, fix them.  If there are any pets around you, be aware of where they are.  Review your medicines with your doctor. Some medicines can make you feel dizzy. This can increase your chance of falling. Ask your doctor what other things that you can do to help prevent falls. This information is not intended to replace advice given to you by your health care provider. Make sure you discuss any questions you have with your health care provider. Document Released: 10/09/2009 Document Revised: 05/20/2016 Document Reviewed: 01/17/2015 Elsevier Interactive Patient Education  2017 [employer].

## 2020-12-25 NOTE — Progress Notes (Addendum)
Subjective:   Katelyn Friedman is a 69 y.o. female who presents for Medicare Annual (Subsequent) preventive examination.  Review of Systems    No ROS.  Medicare Wellness Virtual Visit.    Cardiac Risk Factors include: advanced age (>24men, >39 women)     Objective:    Today's Vitals   12/25/20 1243  Weight: 158 lb (71.7 kg)  Height: 5\' 3"  (1.6 m)   Body mass index is 27.99 kg/m.  Advanced Directives 12/24/2019 12/18/2018 10/26/2017 09/21/2017 09/13/2017 07/18/2017 07/18/2017  Does Patient Have a Medical Advance Directive? No Yes No No No No No  Type of Advance Directive - Healthcare Power of Attorney - - - - -  Does patient want to make changes to medical advance directive? - No - Patient declined - - - - -  Copy of Healthcare Power of Attorney in Chart? - No - copy requested - - - - -  Would patient like information on creating a medical advance directive? No - Patient declined - No - Patient declined No - Patient declined No - Patient declined Yes (Inpatient - patient requests chaplain consult to create a medical advance directive) No - Patient declined    Current Medications (verified) Outpatient Encounter Medications as of 12/25/2020  Medication Sig   acetaminophen (TYLENOL) 500 MG tablet Take 500-1,000 mg by mouth every 6 (six) hours as needed (for pain/headaches.).   acyclovir (ZOVIRAX) 400 MG tablet Take 1 tablet by mouth once daily   Cholecalciferol (VITAMIN D) 2000 units tablet Take 2,000 Units by mouth daily.   levothyroxine (SYNTHROID) 50 MCG tablet Take 50 mcg by mouth daily before breakfast. Pt is taking 50 mg 3 days a week   levothyroxine (SYNTHROID) 75 MCG tablet Take 75 mcg by mouth daily before breakfast. Pt is taking 75 mg 4 days a week   No facility-administered encounter medications on file as of 12/25/2020.    Allergies (verified) Influenza vaccines, Amoxicillin, Codeine, and Mobic [meloxicam]   History: Past Medical History:  Diagnosis Date    Allergic rhinitis    Arthritis    Family history of adverse reaction to anesthesia    PONV   GERD (gastroesophageal reflux disease)    Herpes simplex    History of colon polyps    IBS (irritable bowel syndrome)    PONV (postoperative nausea and vomiting)    Thyroid disease    graves disease   Past Surgical History:  Procedure Laterality Date   BREAST BIOPSY Right 2014   negative   CHOLECYSTECTOMY N/A 09/21/2017   Procedure: LAPAROSCOPIC CHOLECYSTECTOMY WITH INTRAOPERATIVE CHOLANGIOGRAM;  Surgeon: 09/23/2017, MD;  Location: [facility] ORS;  Service: General;  Laterality: N/A;   COLONOSCOPY  05/07/2014   5 mm hyperplastic polyp removed from the rectosigmoid.  Maternal aunt with history colon cancer.  10-year follow-up planned.   KNEE ARTHROSCOPY  1992   KNEE ARTHROSCOPY Right 06/08/2016   Procedure: ARTHROSCOPY KNEE- partial medial minisectomy and condroplasty;  Surgeon: 06/10/2016, MD;  Location: [facility] OR;  Service: Orthopedics;  Laterality: Right;   ROTATOR CUFF REPAIR  2006   2007    TONSILLECTOMY     TUBAL LIGATION     Family History  Problem Relation Age of Onset   Stroke Mother    Alzheimer's disease Mother    Hypertension Mother    Colon polyps Maternal Aunt    Cancer Maternal Aunt        colon   Breast cancer Neg Hx  Social History   Socioeconomic History   Marital status: Married    Spouse name: Not on file   Number of children: Not on file   Years of education: Not on file   Highest education level: Not on file  Occupational History   Not on file  Tobacco Use   Smoking status: Never Smoker   Smokeless tobacco: Never Used  Vaping Use   Vaping Use: Never used  Substance and Sexual Activity   Alcohol use: No    Alcohol/week: 0.0 standard drinks   Drug use: No   Sexual activity: Yes    Partners: Male  Other Topics Concern   Not on file  Social History Narrative   Married: 19 yrs   Regular exercise: walk   Caffeine use: none   Social  Determinants of Radio broadcast assistant Strain: Low Risk    Difficulty of Paying Living Expenses: Not hard at all  Food Insecurity: No Food Insecurity   Worried About Charity fundraiser in the Last Year: Never true   Arboriculturist in the Last Year: Never true  Transportation Needs: No Transportation Needs   Lack of Transportation (Medical): No   Lack of Transportation (Non-Medical): No  Physical Activity: Insufficiently Active   Days of Exercise per Week: 7 days   Minutes of Exercise per Session: 10 min  Stress: No Stress Concern Present   Feeling of Stress : Not at all  Social Connections: Unknown   Frequency of Communication with Friends and Family: Three times a week   Frequency of Social Gatherings with Friends and Family: More than three times a week   Attends Religious Services: Not on Electrical engineer or Organizations: Not on file   Attends Archivist Meetings: Not on file   Marital Status: Married    Tobacco Counseling Counseling given: Not Answered   Clinical Intake:  Pre-visit preparation completed: Yes        Diabetes: No  How often do you need to have someone help you when you read instructions, pamphlets, or other written materials from your doctor or pharmacy?: 1 - Never   Interpreter Needed?: No      Activities of Daily Living In your present state of health, do you have any difficulty performing the following activities: 12/25/2020  Hearing? N  Vision? N  Difficulty concentrating or making decisions? N  Walking or climbing stairs? N  Dressing or bathing? N  Doing errands, shopping? N  Preparing Food and eating ? N  Using the Toilet? N  In the past six months, have you accidently leaked urine? N  Do you have problems with loss of bowel control? N  Managing your Medications? N  Managing your Finances? N  Housekeeping or managing your Housekeeping? N  Some recent data might be hidden    Patient Care  Team: [name], MD as PCP - General (Internal Medicine) [name], MD (Internal Medicine) Bary Castilla Forest Gleason, MD (General Surgery)  Indicate any recent Medical Services you may have received from other than [facility] providers in the past year (date may be approximate).     Assessment:   This is a routine wellness examination for Katelyn Friedman.  I connected with Katelyn Friedman today by telephone and verified that I am speaking with the correct person using two identifiers. Location patient: home Location provider: work Persons participating in the virtual visit: patient, Marine scientist.    I discussed the limitations,  risks, security and privacy concerns of performing an evaluation and management service by telephone and the availability of in person appointments. The patient expressed understanding and verbally consented to this telephonic visit.    Interactive audio and video telecommunications were attempted between this provider and patient, however failed, due to patient having technical difficulties OR patient did not have access to video capability.  We continued and completed visit with audio only.  Some vital signs may be absent or patient reported.   Hearing/Vision screen  Hearing Screening   125Hz  250Hz  500Hz  1000Hz  2000Hz  3000Hz  4000Hz  6000Hz  8000Hz   Right ear:           Left ear:           Comments: Patient is able to hear conversational tones without difficulty. No issues reported.   Vision Screening Comments: Followed by Vision  Wears corrective lenses   Dietary issues and exercise activities discussed: Current Exercise Habits: Home exercise routine, Intensity: Mild  Healthy diet Good water intake   Goals       Patient Stated      Maintain Healthy Lifestyle (pt-stated)      Stay active Healthy diet       Depression Screen PHQ 2/9 Scores 12/25/2020 12/24/2019 05/04/2019 10/26/2018 10/26/2017 07/25/2017 12/15/2015  PHQ - 2 Score 0 0 0 0 0 4 0  PHQ- 9 Score -  - 2 - - 16 -  Exception Documentation - - - - - - (No Data)    Fall Risk Fall Risk  12/25/2020 06/19/2020 03/19/2020 12/24/2019 05/04/2019  Falls in the past year? 0 1 0 0 0  Number falls in past yr: 0 0 0 - 0  Injury with Fall? 0 0 0 - -  Follow up Falls evaluation completed Falls evaluation completed Falls evaluation completed Falls prevention discussed Falls evaluation completed    FALL RISK PREVENTION PERTAINING TO THE HOME: Handrails in use when climbing stairs? Yes Home free of loose throw rugs in walkways, pet beds, electrical cords, etc? Yes  Adequate lighting in your home to reduce risk of falls? Yes   ASSISTIVE DEVICES UTILIZED TO PREVENT FALLS: Life alert? No  Use of a cane, walker or w/c? No  Grab bars in the bathroom? No  Shower chair or bench in shower? No  Elevated toilet seat or a handicapped toilet? No   TIMED UP AND GO: Was the test performed? No . Virtual visit.   Cognitive Function: MMSE - Mini Mental State Exam 12/18/2018 10/26/2017  Orientation to time 5 5  Orientation to Place 5 5  Registration 3 3  Attention/ Calculation 5 5  Recall 3 3  Language- name 2 objects 2 2  Language- repeat 1 1  Language- follow 3 step command 3 3  Language- read & follow direction 1 1  Write a sentence 1 1  Copy design 1 1  Total score 30 30     6CIT Screen 12/25/2020 12/24/2019  What Year? 0 points 0 points  What month? 0 points 0 points  What time? 0 points 0 points  Count back from 20 0 points 0 points  Months in reverse 0 points 0 points  Repeat phrase 0 points 0 points  Total Score 0 0    Immunizations Immunization History  Administered Date(s) Administered   Fluad Quad(high Dose 65+) 08/23/2019, 09/15/2020   Influenza, High Dose Seasonal PF 09/26/2018   PFIZER SARS-COV-2 Vaccination 01/19/2020, 01/30/2020   Pneumococcal Conjugate-13 11/09/2017   Pneumococcal Polysaccharide-23  03/02/2013, 01/30/2019   Tdap 04/10/2014   Health Maintenance Health  Maintenance  Topic Date Due   COVID-19 Vaccine (3 - Booster) 07/29/2020   MAMMOGRAM  03/25/2022   TETANUS/TDAP  04/10/2024   COLONOSCOPY (Pts 45-38yrs Insurance coverage will need to be confirmed)  05/07/2024   INFLUENZA VACCINE  Completed   DEXA SCAN  Completed   Hepatitis C Screening  Completed   PNA vac Low Risk Adult  Completed   Colorectal cancer screening: Type of screening: Colonoscopy. Completed 05/07/14. Repeat every 10 years  Mammogram status: Completed 03/25/20. Repeat every year.MM 3D SCREEN BREAST BILATERAL.  Dexa Scan- ordered 03/19/20.  Lung Cancer Screening: (Low Dose CT Chest recommended if Age 73-80 years, 30 pack-year currently smoking OR have quit w/in 15years.) does not qualify.   Hepatitis C Screening: Completed 10/26/17.  Vision Screening: Recommended annual ophthalmology exams for early detection of glaucoma and other disorders of the eye. Is the patient up to date with their annual eye exam?  Yes   Dental Screening: Recommended annual dental exams for proper oral hygiene.  Community Resource Referral / Chronic Care Management: CRR required this visit?  No   CCM required this visit?  No      Plan:   Keep all routine maintenance appointments.   Cpe 03/20/21 @ 3:00  I have personally reviewed and noted the following in the patient's chart:   Medical and social history Use of alcohol, tobacco or illicit drugs  Current medications and supplements Functional ability and status Nutritional status Physical activity Advanced directives List of other physicians Hospitalizations, surgeries, and ER visits in previous 12 months Vitals Screenings to include cognitive, depression, and falls Referrals and appointments  In addition, I have reviewed and discussed with patient certain preventive protocols, quality metrics, and best practice recommendations. A written personalized care plan for preventive services as well as general preventive health  recommendations were provided to patient via mychart.     OBrien-Blaney, Rosalina Dingwall L, LPN   [id number]      I have reviewed the above information and agree with above.   Duncan Dull, MD

## 2021-01-15 ENCOUNTER — Encounter: Payer: Self-pay | Admitting: Internal Medicine

## 2021-01-15 ENCOUNTER — Ambulatory Visit (INDEPENDENT_AMBULATORY_CARE_PROVIDER_SITE_OTHER): Payer: PPO | Admitting: Internal Medicine

## 2021-01-15 ENCOUNTER — Other Ambulatory Visit: Payer: Self-pay

## 2021-01-15 VITALS — BP 120/79 | HR 64 | Temp 98.4°F | Ht 62.99 in | Wt 152.4 lb

## 2021-01-15 DIAGNOSIS — N6320 Unspecified lump in the left breast, unspecified quadrant: Secondary | ICD-10-CM | POA: Diagnosis not present

## 2021-01-15 DIAGNOSIS — R1031 Right lower quadrant pain: Secondary | ICD-10-CM | POA: Diagnosis not present

## 2021-01-15 DIAGNOSIS — E039 Hypothyroidism, unspecified: Secondary | ICD-10-CM | POA: Diagnosis not present

## 2021-01-15 DIAGNOSIS — R591 Generalized enlarged lymph nodes: Secondary | ICD-10-CM | POA: Diagnosis not present

## 2021-01-15 DIAGNOSIS — N6310 Unspecified lump in the right breast, unspecified quadrant: Secondary | ICD-10-CM | POA: Diagnosis not present

## 2021-01-15 DIAGNOSIS — E0500 Thyrotoxicosis with diffuse goiter without thyrotoxic crisis or storm: Secondary | ICD-10-CM | POA: Diagnosis not present

## 2021-01-15 DIAGNOSIS — R195 Other fecal abnormalities: Secondary | ICD-10-CM | POA: Insufficient documentation

## 2021-01-15 DIAGNOSIS — R222 Localized swelling, mass and lump, trunk: Secondary | ICD-10-CM | POA: Diagnosis not present

## 2021-01-15 DIAGNOSIS — E0590 Thyrotoxicosis, unspecified without thyrotoxic crisis or storm: Secondary | ICD-10-CM | POA: Diagnosis not present

## 2021-01-15 DIAGNOSIS — R634 Abnormal weight loss: Secondary | ICD-10-CM

## 2021-01-15 NOTE — Patient Instructions (Signed)
We are trying to schedule a diagnostic mammogram and ultrasound bilateral breasts asap  Also imaging of you abdomen/pelvis  I will let you know about chest imaging in the future

## 2021-01-15 NOTE — Progress Notes (Signed)
Chief Complaint  Patient presents with  . Cyst    Pt c/o multiple dime sized knots on upper chest in between breasts x3weeks.Soft to touch. Pt had a normal mammogram last year.    F/u  1. Multiple knots dime sized b/l upper breasts feels like knots x 2-3 weeks noted and tender to palpation  2. Hypothyroidism on levo ? Dose 50/75 sees endocrine in [place] last seen 01/2020 Dr[facility] will have f/u upcoming  3. RLQ ab pain and loose stools and chronic abdominal pain ? Cause RLQ still have female anatomy intact except s/p tubal ligation  4. Abnormal weight loss 20 lbs not trying from 170s to 150s    Review of Systems  Constitutional: Positive for weight loss.  HENT: Negative for hearing loss.   Eyes: Negative for blurred vision.  Respiratory: Negative for shortness of breath.   Gastrointestinal: Positive for abdominal pain and diarrhea.  Genitourinary:       +b/l breast mass   Musculoskeletal: Negative for falls and joint pain.  Skin: Negative for rash.  Psychiatric/Behavioral: Negative for depression.   Past Medical History:  Diagnosis Date  . Allergic rhinitis   . Arthritis   . Family history of adverse reaction to anesthesia    PONV  . GERD (gastroesophageal reflux disease)   . Herpes simplex   . History of colon polyps   . IBS (irritable bowel syndrome)   . PONV (postoperative nausea and vomiting)   . Thyroid disease    graves disease   Past Surgical History:  Procedure Laterality Date  . BREAST BIOPSY Right 2014   negative  . CHOLECYSTECTOMY N/A 09/21/2017   Procedure: LAPAROSCOPIC CHOLECYSTECTOMY WITH INTRAOPERATIVE CHOLANGIOGRAM;  Surgeon: Robert Bellow, MD;  Location: [facility];  Service: General;  Laterality: N/A;  . COLONOSCOPY  05/07/2014   5 mm hyperplastic polyp removed from the rectosigmoid.  Maternal aunt with history colon cancer.  10-year follow-up planned.  [name] KNEE ARTHROSCOPY  1992  . KNEE ARTHROSCOPY Right 06/08/2016   Procedure:  ARTHROSCOPY KNEE- partial medial minisectomy and condroplasty;  Surgeon: Melrose Nakayama, MD;  Location: [place];  Service: Orthopedics;  Laterality: Right;  . ROTATOR CUFF REPAIR  2006   Margaretmary Eddy   . TONSILLECTOMY    . TUBAL LIGATION     Family History  Problem Relation Age of Onset  . Stroke Mother   . Alzheimer's disease Mother   . Hypertension Mother   . Colon polyps Maternal Aunt   . Cancer Maternal Aunt        colon  . Breast cancer Maternal Grandmother   . Breast cancer Other        m great GM   Social History   Socioeconomic History  . Marital status: Married    Spouse name: Not on file  . Number of children: Not on file  . Years of education: Not on file  . Highest education level: Not on file  Occupational History  . Not on file  Tobacco Use  . Smoking status: Never Smoker  . Smokeless tobacco: Never Used  Vaping Use  . Vaping Use: Never used  Substance and Sexual Activity  . Alcohol use: No    Alcohol/week: 0.0 standard drinks  . Drug use: No  . Sexual activity: Yes    Partners: Male  Other Topics Concern  . Not on file  Social History Narrative   Married: 19 yrs   Regular exercise: walk   Caffeine  use: none   Social Determinants of Health   Financial Resource Strain: Low Risk   . Difficulty of Paying Living Expenses: Not hard at all  Food Insecurity: No Food Insecurity  . Worried About Charity fundraiser in the Last Year: Never true  . Ran Out of Food in the Last Year: Never true  Transportation Needs: No Transportation Needs  . Lack of Transportation (Medical): No  . Lack of Transportation (Non-Medical): No  Physical Activity: Insufficiently Active  . Days of Exercise per Week: 7 days  . Minutes of Exercise per Session: 10 min  Stress: No Stress Concern Present  . Feeling of Stress : Not at all  Social Connections: Unknown  . Frequency of Communication with Friends and Family: Three times a week  . Frequency of Social Gatherings with  Friends and Family: More than three times a week  . Attends Religious Services: Not on file  . Active Member of Clubs or Organizations: Not on file  . Attends Archivist Meetings: Not on file  . Marital Status: Married  Human resources officer Violence: Not At Risk  . Fear of Current or Ex-Partner: No  . Emotionally Abused: No  . Physically Abused: No  . Sexually Abused: No   Current Meds  Medication Sig  . acetaminophen (TYLENOL) 500 MG tablet Take 500-1,000 mg by mouth every 6 (six) hours as needed (for pain/headaches.).  [name] acyclovir (ZOVIRAX) 400 MG tablet Take 1 tablet by mouth once daily  . Cholecalciferol (VITAMIN D) 2000 units tablet Take 2,000 Units by mouth daily.  [name] levothyroxine (SYNTHROID) 50 MCG tablet Take 50 mcg by mouth daily before breakfast. Pt is taking 50 mg 3 days a week  . levothyroxine (SYNTHROID) 75 MCG tablet Take 75 mcg by mouth daily before breakfast. Pt is taking 75 mg 4 days a week   Allergies  Allergen Reactions  . Influenza Vaccines Swelling and Other (See Comments)    Redness, Fever  . Amoxicillin Diarrhea  . Codeine Nausea Only  . Mobic [Meloxicam] Itching and Rash    Nausea Vomiting   No results found for this or any previous visit (from the past 2160 hour(s)). Objective  Body mass index is 27 kg/m. Wt Readings from Last 3 Encounters:  01/15/21 152 lb 6.4 oz (69.1 kg)  12/25/20 158 lb (71.7 kg)  06/19/20 158 lb 12.8 oz (72 kg)   Temp Readings from Last 3 Encounters:  01/15/21 98.4 F (36.9 C)  06/19/20 (!) 97.4 F (36.3 C) (Temporal)  03/19/20 (!) 97.1 F (36.2 C) (Temporal)   BP Readings from Last 3 Encounters:  01/15/21 120/79  06/19/20 120/70  03/19/20 126/82   Pulse Readings from Last 3 Encounters:  01/15/21 64  06/19/20 72  03/19/20 61    Physical Exam Vitals and nursing note reviewed.  Constitutional:      Appearance: Normal appearance. She is well-developed and well-groomed.  HENT:     Head: Normocephalic and  atraumatic.  Eyes:     Conjunctiva/sclera: Conjunctivae normal.     Pupils: Pupils are equal, round, and reactive to light.  Cardiovascular:     Rate and Rhythm: Normal rate and regular rhythm.     Heart sounds: Normal heart sounds.  Chest:     Chest wall: Mass present.  Breasts:     Right: Tenderness present. No swelling, bleeding, inverted nipple, mass, nipple discharge, skin change or axillary adenopathy.     Left: Tenderness present. No swelling, bleeding, inverted nipple,  mass, nipple discharge, skin change or axillary adenopathy.        Comments: Mass right breast 1 oclock  Mass left breast 11 oclock   Lymphadenopathy:     Upper Body:     Right upper body: No axillary adenopathy.     Left upper body: No axillary adenopathy.  Skin:    General: Skin is warm and moist.  Neurological:     General: No focal deficit present.     Mental Status: She is alert and oriented to person, place, and time.     Gait: Gait normal.  Psychiatric:        Attention and Perception: Attention and perception normal.        Mood and Affect: Mood and affect normal.        Speech: Speech normal.        Behavior: Behavior normal. Behavior is cooperative.        Thought Content: Thought content normal.        Cognition and Memory: Cognition and memory normal.        Judgment: Judgment normal.     Assessment  Plan  Mass of right chest wall b/l breast <1 cm right breast 1 oclock and left 11 oclock - Plan: MM DIAG BREAST TOMO BILATERAL, US BREAST LTD UNI RIGHT INC AXILLA, US BREAST LTD UNI LEFT INC AXILLA Consider CXR/CT chest in the future w/u lymphadenopathy discuss with radiology in the future sch 01/23/21 ~10 am [place]   Abnormal weight loss - Plan: Comprehensive metabolic panel, CBC with Differential/Platelet, CT ABDOMEN PELVIS W WO CONTRAST,   Right lower quadrant abdominal pain with loose stools - Plan: CT ABDOMEN PELVIS W WO CONTRAST  Hypothyroidism, with h/o graves - Plan: T3, free,  T4, free, TSH F/u Dr. [facility] labs to endocrine   Provider: Dr. Olivia Mackie [facility]

## 2021-01-16 ENCOUNTER — Telehealth: Payer: Self-pay | Admitting: Internal Medicine

## 2021-01-16 NOTE — Telephone Encounter (Signed)
Left message to call back for instructions on CT and Chest Xray

## 2021-01-16 NOTE — Addendum Note (Signed)
Addended by: Orland Mustard on: 01/16/2021 02:56 PM   Modules accepted: Orders

## 2021-01-16 NOTE — Progress Notes (Signed)
Ok will do.

## 2021-01-16 NOTE — Telephone Encounter (Signed)
When patient has CT  Ask patient to please get CXR as well same place   Thank you

## 2021-01-16 NOTE — Progress Notes (Signed)
Cmet has been added sorry I am not sure how it was missed.

## 2021-01-16 NOTE — Addendum Note (Signed)
Addended by: Leeanne Rio on: 01/16/2021 02:45 PM   Modules accepted: Orders

## 2021-01-17 LAB — COMPLETE METABOLIC PANEL WITH GFR
AG Ratio: 1.8 (calc) (ref 1.0–2.5)
ALT: 8 U/L (ref 6–29)
AST: 12 U/L (ref 10–35)
Albumin: 4.4 g/dL (ref 3.6–5.1)
Alkaline phosphatase (APISO): 60 U/L (ref 37–153)
BUN/Creatinine Ratio: 15 (calc) (ref 6–22)
BUN: 15 mg/dL (ref 7–25)
CO2: 20 mmol/L (ref 20–32)
Calcium: 9.6 mg/dL (ref 8.6–10.4)
Chloride: 110 mmol/L (ref 98–110)
Creat: 1.01 mg/dL — ABNORMAL HIGH (ref 0.60–0.93)
GFR, Est African American: 65 mL/min/{1.73_m2} (ref >=60)
GFR, Est Non African American: 56 mL/min/{1.73_m2} — ABNORMAL LOW (ref >=60)
Globulin: 2.5 g/dL (calc) (ref 1.9–3.7)
Glucose, Bld: 82 mg/dL (ref 65–99)
Potassium: 4.3 mmol/L (ref 3.5–5.3)
Sodium: 146 mmol/L (ref 135–146)
Total Bilirubin: 0.4 mg/dL (ref 0.2–1.2)
Total Protein: 6.9 g/dL (ref 6.1–8.1)

## 2021-01-17 LAB — CBC WITH DIFFERENTIAL/PLATELET
Absolute Monocytes: 433 cells/uL (ref 200–950)
Basophils Absolute: 50 cells/uL (ref 0–200)
Basophils Relative: 0.7 %
Eosinophils Absolute: 107 cells/uL (ref 15–500)
Eosinophils Relative: 1.5 %
HCT: 39.6 % (ref 35.0–45.0)
Hemoglobin: 13.1 g/dL (ref 11.7–15.5)
Lymphs Abs: 2783 cells/uL (ref 850–3900)
MCH: 28.5 pg (ref 27.0–33.0)
MCHC: 33.1 g/dL (ref 32.0–36.0)
MCV: 86.3 fL (ref 80.0–100.0)
MPV: 11.5 fL (ref 7.5–12.5)
Monocytes Relative: 6.1 %
Neutro Abs: 3728 cells/uL (ref 1500–7800)
Neutrophils Relative %: 52.5 %
Platelets: 267 10*3/uL (ref 140–400)
RBC: 4.59 10*6/uL (ref 3.80–5.10)
RDW: 15.7 % — ABNORMAL HIGH (ref 11.0–15.0)
Total Lymphocyte: 39.2 %
WBC: 7.1 10*3/uL (ref 3.8–10.8)

## 2021-01-17 LAB — TEST AUTHORIZATION

## 2021-01-17 LAB — T3, FREE: T3, Free: 2.5 pg/mL (ref 2.3–4.2)

## 2021-01-17 LAB — TSH: TSH: 0.78 mIU/L (ref 0.40–4.50)

## 2021-01-17 LAB — T4, FREE: Free T4: 1.1 ng/dL (ref 0.8–1.8)

## 2021-01-19 ENCOUNTER — Telehealth: Payer: Self-pay | Admitting: Internal Medicine

## 2021-01-19 NOTE — Telephone Encounter (Signed)
-----   Message from Delorise Jackson, MD sent at 01/16/2021  7:34 AM EST ----- Bloods cts  Thyroid labs normal  -fax to Dr. Chalmers [facility] in [place]   Want to make sure Cmet was done

## 2021-01-19 NOTE — Telephone Encounter (Signed)
Left message to return call 

## 2021-01-20 NOTE — Telephone Encounter (Signed)
Called and spoke to [employer] Husband and gave provider message. Wilson verbalized understanding and states that he will inform Mikhala when she returns.

## 2021-01-23 ENCOUNTER — Other Ambulatory Visit: Payer: PPO

## 2021-01-26 ENCOUNTER — Ambulatory Visit
Admission: RE | Admit: 2021-01-26 | Discharge: 2021-01-26 | Disposition: A | Discharge status: Home / Self Care | Payer: PPO | Source: Ambulatory Visit | Attending: Internal Medicine | Admitting: Internal Medicine

## 2021-01-26 ENCOUNTER — Other Ambulatory Visit: Payer: Self-pay

## 2021-01-26 DIAGNOSIS — N6322 Unspecified lump in the left breast, upper inner quadrant: Secondary | ICD-10-CM | POA: Diagnosis not present

## 2021-01-26 DIAGNOSIS — R222 Localized swelling, mass and lump, trunk: Secondary | ICD-10-CM | POA: Diagnosis not present

## 2021-01-26 DIAGNOSIS — N6001 Solitary cyst of right breast: Secondary | ICD-10-CM | POA: Insufficient documentation

## 2021-01-26 DIAGNOSIS — N6489 Other specified disorders of breast: Secondary | ICD-10-CM | POA: Diagnosis not present

## 2021-01-26 DIAGNOSIS — N6310 Unspecified lump in the right breast, unspecified quadrant: Secondary | ICD-10-CM

## 2021-01-26 DIAGNOSIS — N6320 Unspecified lump in the left breast, unspecified quadrant: Secondary | ICD-10-CM

## 2021-01-26 DIAGNOSIS — R928 Other abnormal and inconclusive findings on diagnostic imaging of breast: Secondary | ICD-10-CM | POA: Diagnosis not present

## 2021-01-28 ENCOUNTER — Telehealth: Payer: Self-pay

## 2021-01-28 NOTE — Telephone Encounter (Signed)
Rad wanted CT w contrast

## 2021-01-28 NOTE — Telephone Encounter (Signed)
Rosemarie Ax from [facility] radiology needs you to call her regarding Dr. Olivia Mackie changing an order for the pt.   [phone]

## 2021-01-28 NOTE — Telephone Encounter (Signed)
Radiology states that order for CT Abdomen Pelvis needs to be changed to without contrast only.   CT Abdomen Pelvis WO Contrast

## 2021-01-29 ENCOUNTER — Other Ambulatory Visit: Payer: Self-pay

## 2021-01-29 ENCOUNTER — Ambulatory Visit: Admission: RE | Admit: 2021-01-29 | Payer: PPO | Source: Ambulatory Visit

## 2021-01-29 ENCOUNTER — Ambulatory Visit
Admission: RE | Admit: 2021-01-29 | Discharge: 2021-01-29 | Disposition: A | Discharge status: Home / Self Care | Payer: PPO | Source: Ambulatory Visit | Attending: Internal Medicine | Admitting: Internal Medicine

## 2021-01-29 DIAGNOSIS — R195 Other fecal abnormalities: Secondary | ICD-10-CM | POA: Diagnosis not present

## 2021-01-29 DIAGNOSIS — R634 Abnormal weight loss: Secondary | ICD-10-CM | POA: Diagnosis not present

## 2021-01-29 DIAGNOSIS — R1031 Right lower quadrant pain: Secondary | ICD-10-CM | POA: Diagnosis not present

## 2021-01-29 DIAGNOSIS — R109 Unspecified abdominal pain: Secondary | ICD-10-CM | POA: Diagnosis not present

## 2021-01-29 MED ORDER — IOHEXOL 300 MG/ML  SOLN
100.0000 mL | Freq: Once | INTRAMUSCULAR | Status: AC | PRN
  Administered 2021-01-29: 100 mL via INTRAVENOUS

## 2021-01-31 ENCOUNTER — Encounter: Payer: Self-pay | Admitting: Internal Medicine

## 2021-01-31 DIAGNOSIS — I700 Atherosclerosis of aorta: Secondary | ICD-10-CM | POA: Insufficient documentation

## 2021-01-31 NOTE — Progress Notes (Signed)
Aortic atherosclerosis was Noted on CTabd pelvis done during your valuation for abdominal pain.  An non urgent office visit is needed to discuss this finding and what we can do about it .

## 2021-03-17 DIAGNOSIS — G43109 Migraine with aura, not intractable, without status migrainosus: Secondary | ICD-10-CM | POA: Diagnosis not present

## 2021-03-20 ENCOUNTER — Encounter: Payer: PPO | Admitting: Internal Medicine

## 2021-04-14 ENCOUNTER — Other Ambulatory Visit: Payer: Self-pay

## 2021-04-14 ENCOUNTER — Encounter: Payer: Self-pay | Admitting: Internal Medicine

## 2021-04-14 ENCOUNTER — Ambulatory Visit (INDEPENDENT_AMBULATORY_CARE_PROVIDER_SITE_OTHER): Payer: PPO | Admitting: Internal Medicine

## 2021-04-14 VITALS — BP 124/76 | HR 54 | Temp 98.1°F | Resp 14 | Ht 62.0 in | Wt 152.4 lb

## 2021-04-14 DIAGNOSIS — F411 Generalized anxiety disorder: Secondary | ICD-10-CM

## 2021-04-14 DIAGNOSIS — I708 Atherosclerosis of other arteries: Secondary | ICD-10-CM

## 2021-04-14 DIAGNOSIS — E782 Mixed hyperlipidemia: Secondary | ICD-10-CM

## 2021-04-14 DIAGNOSIS — R634 Abnormal weight loss: Secondary | ICD-10-CM | POA: Diagnosis not present

## 2021-04-14 DIAGNOSIS — M8580 Other specified disorders of bone density and structure, unspecified site: Secondary | ICD-10-CM | POA: Diagnosis not present

## 2021-04-14 DIAGNOSIS — E039 Hypothyroidism, unspecified: Secondary | ICD-10-CM

## 2021-04-14 DIAGNOSIS — Z780 Asymptomatic menopausal state: Secondary | ICD-10-CM

## 2021-04-14 DIAGNOSIS — M25561 Pain in right knee: Secondary | ICD-10-CM | POA: Diagnosis not present

## 2021-04-14 DIAGNOSIS — E559 Vitamin D deficiency, unspecified: Secondary | ICD-10-CM

## 2021-04-14 DIAGNOSIS — I700 Atherosclerosis of aorta: Secondary | ICD-10-CM

## 2021-04-14 MED ORDER — ZOSTER VAC RECOMB ADJUVANTED 50 MCG/0.5ML IM SUSR: 0.5000 mL | Freq: Once | INTRAMUSCULAR | 1 refills | Status: AC

## 2021-04-14 MED ORDER — ATORVASTATIN CALCIUM 20 MG PO TABS
20.0000 mg | ORAL_TABLET | Freq: Every day | ORAL | 3 refills | Status: DC
Start: 2021-04-14 — End: 2022-05-05

## 2021-04-14 NOTE — Progress Notes (Signed)
Patient ID: Katelyn Friedman, female    DOB: Dec 20, 1951  Age: 70 y.o. MRN: [id number]  The patient is here for follow up and  and management of other chronic and acute problems.   The risk factors are reflected in the social history.  The roster of all physicians providing medical care to patient - is listed in the Snapshot section of the chart.  Activities of daily living:  The patient is 100% independent in all ADLs: dressing, toileting, feeding as well as independent mobility  Home safety : The patient has smoke detectors in the home. They wear seatbelts.  There are no firearms at home. There is no violence in the home.   There is no risks for hepatitis, STDs or HIV. There is no   history of blood transfusion. They have no travel history to infectious disease endemic areas of the world.  The patient has seen their dentist in the last six month. They have seen their eye doctor in the last year. They admit to slight hearing difficulty with regard to whispered voices and some television programs.  They have deferred audiologic testing in the last year.  They do not  have excessive sun exposure. Discussed the need for sun protection: hats, long sleeves and use of sunscreen if there is significant sun exposure.   Diet: the importance of a healthy diet is discussed. They do have a healthy diet.  The benefits of regular aerobic exercise were discussed. She walks 4 times per week ,  20 minutes.   Depression screen: there are no signs or vegative symptoms of depression- irritability, change in appetite, anhedonia, sadness/tearfullness.  Cognitive assessment: the patient manages all their financial and personal affairs and is actively engaged. They could relate day,date,year and events; recalled 2/3 objects at 3 minutes; performed clock-face test normally.  The following portions of the patient's history were reviewed and updated as appropriate: allergies, current medications, past family history,  past medical history,  past surgical history, past social history  and problem list.  Visual acuity was not assessed per patient preference since she has regular follow up with her ophthalmologist. Hearing and body mass index were assessed and reviewed.   During the course of the visit the patient was educated and counseled about appropriate screening and preventive services including : fall prevention , diabetes screening, nutrition counseling, colorectal cancer screening, and recommended immunizations.    CC: The primary encounter diagnosis was Postmenopausal estrogen deficiency. Diagnoses of Acute pain of right knee, Vitamin D deficiency, Mixed hyperlipidemia, Abnormal weight loss, Generalized anxiety disorder, Hypothyroidism, unspecified type, Atherosclerosis of aortic bifurcation and common iliac arteries ([place]), and Osteopenia after menopause were also pertinent to this visit.   Episodes of new onset migraines with visual phenomena  Involving the left eye only . Saw [place];  eye ok,  Small cataract   Does not want medications  to prevent     History Katelyn Friedman has a past medical history of Allergic rhinitis, Arthritis, Family history of adverse reaction to anesthesia, GERD (gastroesophageal reflux disease), Herpes simplex, History of colon polyps, IBS (irritable bowel syndrome), PONV (postoperative nausea and vomiting), and Thyroid disease.   She has a past surgical history that includes Rotator cuff repair (2006); Knee arthroscopy (1992); Tubal ligation; Tonsillectomy; Colonoscopy (05/07/2014); Breast biopsy (Right, 2014); Knee arthroscopy (Right, 06/08/2016); and Cholecystectomy (N/A, 09/21/2017).   Her family history includes Alzheimer's disease in her mother; Breast cancer in her maternal grandmother and another family member; Cancer in her maternal  aunt; Colon polyps in her maternal aunt; Hypertension in her mother; Stroke in her mother.She reports that she has never smoked. She has  never used smokeless tobacco. She reports that she does not drink alcohol and does not use drugs.  Outpatient Medications Prior to Visit  Medication Sig Dispense Refill  . acetaminophen (TYLENOL) 500 MG tablet Take 500-1,000 mg by mouth every 6 (six) hours as needed (for pain/headaches.).    [name] acyclovir (ZOVIRAX) 400 MG tablet Take 1 tablet by mouth once daily 90 tablet 0  . Cholecalciferol (VITAMIN D) 2000 units tablet Take 2,000 Units by mouth daily.    [name] levothyroxine (SYNTHROID) 50 MCG tablet Take 50 mcg by mouth daily before breakfast. Pt is taking 50 mg 4 days a week    . levothyroxine (SYNTHROID) 75 MCG tablet Take 75 mcg by mouth daily before breakfast. Pt is taking 75 mg 3 days a week     No facility-administered medications prior to visit.    Review of Systems   Patient denies headache, fevers, malaise, unintentional weight loss, skin rash, eye pain, sinus congestion and sinus pain, sore throat, dysphagia,  hemoptysis , cough, dyspnea, wheezing, chest pain, palpitations, orthopnea, edema, abdominal pain, nausea, melena, diarrhea, constipation, flank pain, dysuria, hematuria, urinary  Frequency, nocturia, numbness, tingling, seizures,  Focal weakness, Loss of consciousness,  Tremor, insomnia, depression, anxiety, and suicidal ideation.     Objective:  BP 124/76 (BP Location: Left Arm, Patient Position: Sitting, Cuff Size: Normal)   Pulse (!) 54   Temp 98.1 F (36.7 C) (Oral)   Resp 14   Ht 5\' 2"  (1.575 m)   Wt 152 lb 6.4 oz (69.1 kg)   SpO2 99%   BMI 27.87 kg/m   Physical Exam  General appearance: alert, cooperative and appears stated age Head: Normocephalic, without obvious abnormality, atraumatic Eyes: conjunctivae/corneas clear. PERRL, EOM's intact. Fundi benign. Ears: normal TM's and external ear canals both ears Nose: Nares normal. Septum midline. Mucosa normal. No drainage or sinus tenderness. Throat: lips, mucosa, and tongue normal; teeth and gums normal Neck: no  adenopathy, no carotid bruit, no JVD, supple, symmetrical, trachea midline and thyroid not enlarged, symmetric, no tenderness/mass/nodules Lungs: clear to auscultation bilaterally Breasts: normal appearance, no masses or tenderness Heart: regular rate and rhythm, S1, S2 normal, no murmur, click, rub or gallop Abdomen: soft, non-tender; bowel sounds normal; no masses,  no organomegaly Extremities: extremities normal, atraumatic, no cyanosis or edema Pulses: 2+ and symmetric Skin: Skin color, texture, turgor normal. No rashes or lesions Neurologic: Alert and oriented X 3, normal strength and tone. Normal symmetric reflexes. Normal coordination and gait.     Assessment & Plan:   Problem List Items Addressed This Visit      Unprioritized   RESOLVED: Abnormal weight loss   Atherosclerosis of aortic bifurcation and common iliac arteries ([facility])    Seen on recent CT and discussed :  Emphasized  need for statin therapy given documented evidence of moderate  atherosclerosis in the aorta noted on recent  CT of abdomen and  pelvis and the prognostic implications of this finding. Statin prescribed      Relevant Medications   atorvastatin (LIPITOR) 20 MG tablet   Generalized anxiety disorder    No pathologic cause found.  CT abd and pelvis reviewed       Hypothyroidism    Thyroid function is WNL on current regimen .  No current changes needed.   Lab Results  Component Value Date  TSH 0.78 01/15/2021         Osteopenia after menopause    Bone Density scores received, she has bone loss which is significant.  15 minutes was spent with patient reviewing her report, personal history and the risks and benefits of various pharmacologic treatments versus waiting.  I recommended  treating with alendronate weekly dosing , increasing dietary calcium intake to 1800  mg daily, 2/3 through diet and 1/3 through supplements ,  Vitamin D, the amount of which to be dictated by periodic Vit D measurements, and   daily weight bearing exercise .        RESOLVED: Right knee pain   Vitamin D deficiency   Relevant Orders   VITAMIN D 25 Hydroxy (Vit-D Deficiency, Fractures)    Other Visit Diagnoses    Postmenopausal estrogen deficiency    -  Primary   Relevant Orders   DG Bone Density   Mixed hyperlipidemia       Relevant Medications   atorvastatin (LIPITOR) 20 MG tablet   Other Relevant Orders   Comprehensive metabolic panel      I am having Katelyn Evalina Field "Gwen" start on Zoster Vaccine Adjuvanted and atorvastatin. I am also having her maintain her Vitamin D, acetaminophen, levothyroxine, levothyroxine, and acyclovir.  Meds ordered this encounter  Medications  . Zoster Vaccine Adjuvanted [facility]) injection    Sig: Inject 0.5 mLs into the muscle once for 1 dose.    Dispense:  1 each    Refill:  1  . atorvastatin (LIPITOR) 20 MG tablet    Sig: Take 1 tablet (20 mg total) by mouth daily.    Dispense:  90 tablet    Refill:  3  A total of 40 minutes was spent with patient more than half of which was spent in counseling patient on the above mentioned issues , reviewing and explaining recent labs and imaging studies done, and coordination of care.  There are no discontinued medications.  Follow-up: No follow-ups on file.   [name], MD

## 2021-04-14 NOTE — Patient Instructions (Addendum)
  Your aortic atherosclerosis places you at increased risk for heart attack and stroke  I recommend that you start a trial of generic   Lipitor  .  Take it once daily ,  Return for liver enzymes (non fasting) after 3 weeks of therapy  DEXA ordered.  I recommend getting the majority of your calcium and Vitamin D  through diet rather than supplements given the recent association of calcium supplements with increased coronary artery calcium scores (You need 1200 to 1800 mg of calcium daily )   Unsweetened almond/coconut milk and soy milk are low carb, cholesterol free  ways to increase your dietary calcium and vitamin D.   Vitamin D intake should be 1000 to 2000 Ius  Daily unless otherwise instructed  (we are checking your level in 3 weeks)

## 2021-04-14 NOTE — Assessment & Plan Note (Deleted)
Reviewed findings of prior CT scan today..  Patient is willing to  Initiate statin therpay starting with 20 mg crestor once daily and advance as tolerated to twice weekly.

## 2021-04-15 DIAGNOSIS — M8580 Other specified disorders of bone density and structure, unspecified site: Secondary | ICD-10-CM | POA: Insufficient documentation

## 2021-04-15 DIAGNOSIS — Z780 Asymptomatic menopausal state: Secondary | ICD-10-CM | POA: Insufficient documentation

## 2021-04-15 NOTE — Assessment & Plan Note (Signed)
No pathologic cause found.  CT abd and pelvis reviewed

## 2021-04-15 NOTE — Assessment & Plan Note (Addendum)
Seen on recent CT and discussed :  Emphasized  need for statin therapy given documented evidence of moderate  atherosclerosis in the aorta noted on recent  CT of abdomen and  pelvis and the prognostic implications of this finding. Statin prescribed

## 2021-04-15 NOTE — Assessment & Plan Note (Signed)
Thyroid function is WNL on current regimen .  No current changes needed.   Lab Results  Component Value Date   TSH 0.78 01/15/2021

## 2021-04-15 NOTE — Assessment & Plan Note (Signed)
Bone Density scores received, she has bone loss which is significant.  15 minutes was spent with patient reviewing her report, personal history and the risks and benefits of various pharmacologic treatments versus waiting.  I recommended  treating with alendronate weekly dosing , increasing dietary calcium intake to 1800  mg daily, 2/3 through diet and 1/3 through supplements ,  Vitamin D, the amount of which to be dictated by periodic Vit D measurements, and  daily weight bearing exercise .

## 2021-04-17 DIAGNOSIS — K219 Gastro-esophageal reflux disease without esophagitis: Secondary | ICD-10-CM | POA: Diagnosis not present

## 2021-04-17 DIAGNOSIS — E890 Postprocedural hypothyroidism: Secondary | ICD-10-CM | POA: Diagnosis not present

## 2021-06-22 DIAGNOSIS — E890 Postprocedural hypothyroidism: Secondary | ICD-10-CM | POA: Diagnosis not present

## 2021-06-23 ENCOUNTER — Encounter: Payer: Self-pay | Admitting: Internal Medicine

## 2021-07-06 ENCOUNTER — Other Ambulatory Visit: Payer: Self-pay

## 2021-07-06 ENCOUNTER — Ambulatory Visit
Admission: RE | Admit: 2021-07-06 | Discharge: 2021-07-06 | Disposition: A | Discharge status: Home / Self Care | Payer: PPO | Source: Ambulatory Visit | Attending: Internal Medicine | Admitting: Internal Medicine

## 2021-07-06 DIAGNOSIS — Z780 Asymptomatic menopausal state: Secondary | ICD-10-CM | POA: Diagnosis not present

## 2021-07-06 DIAGNOSIS — M8588 Other specified disorders of bone density and structure, other site: Secondary | ICD-10-CM | POA: Diagnosis not present

## 2021-09-01 DIAGNOSIS — Z03818 Encounter for observation for suspected exposure to other biological agents ruled out: Secondary | ICD-10-CM | POA: Diagnosis not present

## 2021-09-01 DIAGNOSIS — Z20822 Contact with and (suspected) exposure to covid-19: Secondary | ICD-10-CM | POA: Diagnosis not present

## 2021-10-12 ENCOUNTER — Telehealth: Payer: Self-pay | Admitting: Internal Medicine

## 2021-10-12 DIAGNOSIS — H2513 Age-related nuclear cataract, bilateral: Secondary | ICD-10-CM | POA: Diagnosis not present

## 2021-10-12 NOTE — Telephone Encounter (Signed)
Patient is calling in to request a refill on her acyclovir (ZOVIRAX) 400 MG tablet.Please send to the [facility] on [address].

## 2021-10-13 MED ORDER — ACYCLOVIR 400 MG PO TABS: 400.0000 mg | ORAL_TABLET | Freq: Every day | ORAL | 0 refills | Status: DC

## 2021-10-13 NOTE — Telephone Encounter (Signed)
Left message for patient to call office, medication not filled since 2021 need to know why medication needs to be filled.

## 2021-10-13 NOTE — Telephone Encounter (Signed)
Medication refilled and spoke with patient.

## 2021-10-13 NOTE — Telephone Encounter (Signed)
Patient called in stating that she is at the pharmacy and they do not have her prescription.Please call her on her cell at [phone].

## 2021-10-15 ENCOUNTER — Ambulatory Visit: Payer: PPO

## 2021-10-30 ENCOUNTER — Ambulatory Visit (INDEPENDENT_AMBULATORY_CARE_PROVIDER_SITE_OTHER): Payer: PPO | Admitting: Internal Medicine

## 2021-10-30 ENCOUNTER — Encounter: Payer: Self-pay | Admitting: Internal Medicine

## 2021-10-30 ENCOUNTER — Other Ambulatory Visit: Payer: Self-pay

## 2021-10-30 VITALS — BP 106/62 | HR 78 | Temp 96.1°F | Ht 62.0 in | Wt 158.4 lb

## 2021-10-30 DIAGNOSIS — F5105 Insomnia due to other mental disorder: Secondary | ICD-10-CM

## 2021-10-30 DIAGNOSIS — Z23 Encounter for immunization: Secondary | ICD-10-CM | POA: Diagnosis not present

## 2021-10-30 DIAGNOSIS — J069 Acute upper respiratory infection, unspecified: Secondary | ICD-10-CM | POA: Diagnosis not present

## 2021-10-30 DIAGNOSIS — R7303 Prediabetes: Secondary | ICD-10-CM | POA: Diagnosis not present

## 2021-10-30 DIAGNOSIS — I700 Atherosclerosis of aorta: Secondary | ICD-10-CM | POA: Diagnosis not present

## 2021-10-30 DIAGNOSIS — F419 Anxiety disorder, unspecified: Secondary | ICD-10-CM | POA: Diagnosis not present

## 2021-10-30 DIAGNOSIS — E039 Hypothyroidism, unspecified: Secondary | ICD-10-CM | POA: Diagnosis not present

## 2021-10-30 DIAGNOSIS — E782 Mixed hyperlipidemia: Secondary | ICD-10-CM | POA: Diagnosis not present

## 2021-10-30 MED ORDER — PREDNISONE 10 MG PO TABS: ORAL_TABLET | ORAL | 0 refills | Status: DC

## 2021-10-30 NOTE — Assessment & Plan Note (Signed)
She prefers to avoid medication,  melatonin use reocmmended

## 2021-10-30 NOTE — Assessment & Plan Note (Signed)
Seen on recent CT and reviewed iscussed :  Emphasized  Continued use of  statin therapy given documented evidence of moderate  atherosclerosis in the aorta noted on recent  CT of abdomen and  pelvis and the prognostic implications of this finding.

## 2021-10-30 NOTE — Assessment & Plan Note (Signed)
Her symptoms do not suggest that she has a bacterial infection and therefore does not need to take an antibiotic, because they will not help viral infections .advised to continue the allegra and tylenol, add  sudafed PE  Up to 30 mg every 6 hours to manage the sinus congestionand substitute Afrin nasal spray for the evening dose to avoid insomnia. I'll call in a prednisone taper

## 2021-10-30 NOTE — Patient Instructions (Addendum)
Your sinus infection  is likely viral and does no need antibiotics   Start using Sudafe PE  every 6 hours,  abd/or Afrin nasal spray for congestion,   continue Sinus flushes with Milta Deiters Med's rinse,   Add the  prednisone  taper for inflammation, in 72 hours  Benadryl for the nighttime runny nose Robitussin for the cough    Call for antibiotic only if symptoms of bacterial sinusitis occur, and advised to take probiotic if the antibiotic is taken,  For a minimum of 3 weeks.     For the constipation:   YOU NEED 25 G OF FIBER AND 60 OUNCES OF WATER DAILY  FOR HEALTHY BOWELS   THE [place] HAS THE HIGHEST FIBER CONTENT OF ANY I HAVE SEEN   The alternative Is daily use of a a bulk forming laxative.  Like metamucil

## 2021-10-30 NOTE — Progress Notes (Signed)
Subjective:  Patient ID: Katelyn Friedman, female    DOB: 15-May-1951  Age: 70 y.o. MRN: [id number]  CC: There were no encounter diagnoses.  HPI Katelyn Friedman presents for  Chief Complaint  Patient presents with   Follow-up    6 month follow up    Acute Visit    Pt stated that she has a sinus infection. Pt stated that for 4 to 5 days she has had a cough and congestion(clear mucus). No fever, no chills, no body aches. Not been tested for covid.    This visit occurred during the SARS-CoV-2 public health emergency.  Safety protocols were in place, including screening questions prior to the visit, additional usage of staff PPE, and extensive cleaning of exam room while observing appropriate contact time as indicated for disinfecting solutions.    cough and cold symptoms started last week .  Sinus congestion., clear rhinorrhea,  fatigue,  cough,  ears feel stuffy.    No fevers and facial pain.  Cough keeping her up at night   Seeing Balan for thyroid annually .  Taking 75 mcg 2 days /week,  and 50 mcg the other 5 days  wants thyroid level checked   Aortic atherosclerosis  :  found inidentally on CT abd/pelvis  done in February by [place] for work up of diarrhea Reviewed findings of prior CT scan today. In Feb 2022.  [name]  Patient is tolerating high potency statin therapy  Taking atorvastatin  20 mg     Outpatient Medications Prior to Visit  Medication Sig Dispense Refill   acetaminophen (TYLENOL) 500 MG tablet Take 500-1,000 mg by mouth every 6 (six) hours as needed (for pain/headaches.).     acyclovir (ZOVIRAX) 400 MG tablet Take 1 tablet (400 mg total) by mouth daily. 90 tablet 0   atorvastatin (LIPITOR) 20 MG tablet Take 1 tablet (20 mg total) by mouth daily. 90 tablet 3   Cholecalciferol (VITAMIN D) 2000 units tablet Take 2,000 Units by mouth daily.     levothyroxine (SYNTHROID) 50 MCG tablet Take 50 mcg by mouth daily before breakfast. Pt is taking 50 mg 4 days a week     levothyroxine  (SYNTHROID) 75 MCG tablet Take 75 mcg by mouth daily before breakfast. Pt is taking 75 mg 3 days a week     No facility-administered medications prior to visit.    Review of Systems;  Patient denies headache, fevers, malaise, unintentional weight loss, skin rash, eye pain, sinus congestion and sinus pain, sore throat, dysphagia,  hemoptysis , cough, dyspnea, wheezing, chest pain, palpitations, orthopnea, edema, abdominal pain, nausea, melena, diarrhea, constipation, flank pain, dysuria, hematuria, urinary  Frequency, nocturia, numbness, tingling, seizures,  Focal weakness, Loss of consciousness,  Tremor, insomnia, depression, anxiety, and suicidal ideation.      Objective:  BP 106/62 (BP Location: Left Arm, Patient Position: Sitting, Cuff Size: Normal)   Pulse 78   Temp (!) 96.1 F (35.6 C) (Temporal)   Ht 5\' 2"  (1.575 m)   Wt 158 lb 6.4 oz (71.8 kg)   SpO2 99%   BMI 28.97 kg/m   BP Readings from Last 3 Encounters:  10/30/21 106/62  04/14/21 124/76  01/15/21 120/79    Wt Readings from Last 3 Encounters:  10/30/21 158 lb 6.4 oz (71.8 kg)  04/14/21 152 lb 6.4 oz (69.1 kg)  01/15/21 152 lb 6.4 oz (69.1 kg)    General appearance: alert, cooperative and appears stated age Ears: normal TM's and external  ear canals both ears Throat: lips, mucosa, and tongue normal; teeth and gums normal Neck: no adenopathy, no carotid bruit, supple, symmetrical, trachea midline and thyroid not enlarged, symmetric, no tenderness/mass/nodules Back: symmetric, no curvature. ROM normal. No CVA tenderness. Lungs: clear to auscultation bilaterally Heart: regular rate and rhythm, S1, S2 normal, no murmur, click, rub or gallop Abdomen: soft, non-tender; bowel sounds normal; no masses,  no organomegaly Pulses: 2+ and symmetric Skin: Skin color, texture, turgor normal. No rashes or lesions Lymph nodes: Cervical, supraclavicular, and axillary nodes normal.  Lab Results  Component Value Date   HGBA1C 6.0  06/19/2020   HGBA1C 5.9 03/19/2020    Lab Results  Component Value Date   CREATININE 1.01 (H) 01/15/2021   CREATININE 1.02 06/19/2020   CREATININE 0.88 03/19/2020    Lab Results  Component Value Date   WBC 7.1 01/15/2021   HGB 13.1 01/15/2021   HCT 39.6 01/15/2021   PLT 267 01/15/2021   GLUCOSE 82 01/15/2021   CHOL 213 (H) 06/19/2020   TRIG 116.0 06/19/2020   HDL 58.50 06/19/2020   LDLDIRECT 151 (H) 08/25/2016   LDLCALC 131 (H) 06/19/2020   ALT 8 01/15/2021   AST 12 01/15/2021   NA 146 01/15/2021   K 4.3 01/15/2021   CL 110 01/15/2021   CREATININE 1.01 (H) 01/15/2021   BUN 15 01/15/2021   CO2 20 01/15/2021   TSH 0.78 01/15/2021   INR 1.0 05/09/2018   HGBA1C 6.0 06/19/2020    DG Bone Density  Result Date: 07/06/2021 EXAM: DUAL X-RAY ABSORPTIOMETRY (DXA) FOR BONE MINERAL DENSITY IMPRESSION: Your patient Tya Haughey completed a BMD test on 07/06/2021 using the [employer] iDXA DXA System (software version: 14.10) manufactured by [employer]. The following summarizes the results of our evaluation. Technologist: ECJ PATIENT BIOGRAPHICAL: Name: Hazelgrace, Bonham Patient ID: [id number] Birth Date: 01-Jun-1951 Height: 62.0 in. Gender: Female Exam Date: 07/06/2021 Weight: 152.4 lbs. Indications: Advanced Age, Hypothyroid, Parent Hip Fracture, Postmenopausal, Vit D Defic, Family Hist. (Parent hip fracture) Fractures: Toe Treatments: Calcium, Levothyroxine, Vitamin D DENSITOMETRY RESULTS: Site      Region     Measured Date Measured Age WHO Classification Young Adult T-score BMD         %Change vs. Previous Significant Change (*) AP Spine L1-L4 07/06/2020 71.0 Osteopenia -1.8 0.972 g/cm2 -3.3% Yes AP Spine L1-L4 04/24/2013 62.8 Osteopenia -1.5 1.005 g/cm2 - - DualFemur Total Left 07/06/2021 71.0 Normal -0.7 0.914 g/cm2 -10.7% Yes DualFemur Total Left 04/24/2013 62.8 Normal 0.1 1.023 g/cm2 - - DualFemur Total Mean 07/06/2021 71.0 Normal -0.6 0.930 g/cm2 -8.7% Yes DualFemur Total  Mean 04/24/2013 62.8 Normal 0.1 1.019 g/cm2 - - ASSESSMENT: The BMD measured at AP Spine L1-L4 is 0.972 g/cm2 with a T-score of -1.8. This patient is considered osteopenic according to [facility]) criteria. The scan quality is good. Compared with prior study, there has been a significant significant decrease in the spine. Compared with prior study, there has been a significant decrease in the total hip. World Pharmacologist [facility]) criteria for post-menopausal, Caucasian Women: Normal:                   T-score at or above -1 SD Osteopenia/low bone mass: T-score between -1 and -2.5 SD Osteoporosis:             T-score at or below -2.5 SD RECOMMENDATIONS: 1. All patients should optimize calcium and vitamin D intake. 2. Consider FDA-approved medical therapies in postmenopausal women  and men aged 22 years and older, based on the following: a. A hip or vertebral(clinical or morphometric) fracture b. T-score < -2.5 at the femoral neck or spine after appropriate evaluation to exclude secondary causes c. Low bone mass (T-score between -1.0 and -2.5 at the femoral neck or spine) and a 10-year probability of a hip fracture > 3% or a 10-year probability of a major osteoporosis-related fracture > 20% based on the [place]-adapted WHO algorithm 3. Clinician judgment and/or patient preferences may indicate treatment for people with 10-year fracture probabilities above or below these levels FOLLOW-UP: People with diagnosed cases of osteoporosis or at high risk for fracture should have regular bone mineral density tests. For patients eligible for Medicare, routine testing is allowed once every 2 years. The testing frequency can be increased to one year for patients who have rapidly progressing disease, those who are receiving or discontinuing medical therapy to restore bone mass, or have additional risk factors. I have reviewed this report, and agree with the above findings. [facility] Dear [name], Your patient Katelyn Friedman completed a FRAX assessment on 07/06/2021 using the [place] (analysis version: 14.10) manufactured by [employer]. The following summarizes the results of our evaluation. PATIENT BIOGRAPHICAL: Name: Conleigh, Heinlein Patient ID: [id number] Birth Date: 19-Feb-1951 Height:    62.0 in. Gender:     Female    [age]       Weight:    152.4 lbs. Ethnicity:  Black                            Exam Date: 07/06/2021 FRAX* RESULTS:  (version: 3.5) 10-year Probability of Fracture1 Major Osteoporotic Fracture2 Hip Fracture 5.0% 0.5% Population: [place] (Black) Risk Factors: Family Hist. (Parent hip fracture) Based on Femur (Left) Neck BMD 1 -The 10-year probability of fracture may be lower than reported if the patient has received treatment. 2 -Major Osteoporotic Fracture: Clinical Spine, Forearm, Hip or Shoulder *FRAX is a Materials engineer of the [employer] for Metabolic Bone Disease, a [place] (WHO) [facility]. ASSESSMENT: The probability of a major osteoporotic fracture is 5.0% within the next ten years. The probability of a hip fracture is 0.5% within the next ten years. . I have reviewed this report and agree with the above findings. Mark A. Thornton Papas, M.D. [facility] Electronically Signed   By: Lavonia Dana M.D.   On: 07/06/2021 09:43    Assessment & Plan:   Problem List Items Addressed This Visit   None   I am having Zari Evalina Field "Gwen" maintain her Vitamin D, acetaminophen, levothyroxine, levothyroxine, atorvastatin, and acyclovir.  No orders of the defined types were placed in this encounter.   There are no discontinued medications.  Follow-up: No follow-ups on file.   [name], MD   Subjective:  Patient ID: Katelyn Friedman, female    DOB: 11-Oct-1951  Age: 70 y.o. MRN: [id number]  CC: There were no encounter diagnoses.  HPI Katelyn Friedman presents for  Chief  Complaint  Patient presents with   Follow-up    6 month follow up    Acute Visit    Pt stated that she has a sinus infection. Pt stated that for 4 to 5 days she has had a cough and congestion(clear mucus). No fever, no chills, no body aches. Not been tested  for covid.       Outpatient Medications Prior to Visit  Medication Sig Dispense Refill   acetaminophen (TYLENOL) 500 MG tablet Take 500-1,000 mg by mouth every 6 (six) hours as needed (for pain/headaches.).     acyclovir (ZOVIRAX) 400 MG tablet Take 1 tablet (400 mg total) by mouth daily. 90 tablet 0   atorvastatin (LIPITOR) 20 MG tablet Take 1 tablet (20 mg total) by mouth daily. 90 tablet 3   Cholecalciferol (VITAMIN D) 2000 units tablet Take 2,000 Units by mouth daily.     levothyroxine (SYNTHROID) 50 MCG tablet Take 50 mcg by mouth daily before breakfast. Pt is taking 50 mg 4 days a week     levothyroxine (SYNTHROID) 75 MCG tablet Take 75 mcg by mouth daily before breakfast. Pt is taking 75 mg 3 days a week     No facility-administered medications prior to visit.    Review of Systems;  Patient denies headache, fevers, malaise, unintentional weight loss, skin rash, eye pain, sinus congestion and sinus pain, sore throat, dysphagia,  hemoptysis , cough, dyspnea, wheezing, chest pain, palpitations, orthopnea, edema, abdominal pain, nausea, melena, diarrhea, constipation, flank pain, dysuria, hematuria, urinary  Frequency, nocturia, numbness, tingling, seizures,  Focal weakness, Loss of consciousness,  Tremor, insomnia, depression, anxiety, and suicidal ideation.      Objective:  BP 106/62 (BP Location: Left Arm, Patient Position: Sitting, Cuff Size: Normal)   Pulse 78   Temp (!) 96.1 F (35.6 C) (Temporal)   Ht 5\' 2"  (1.575 m)   Wt 158 lb 6.4 oz (71.8 kg)   SpO2 99%   BMI 28.97 kg/m   BP Readings from Last 3 Encounters:  10/30/21 106/62  04/14/21 124/76  01/15/21 120/79    Wt Readings from Last 3 Encounters:  10/30/21  158 lb 6.4 oz (71.8 kg)  04/14/21 152 lb 6.4 oz (69.1 kg)  01/15/21 152 lb 6.4 oz (69.1 kg)    General appearance: alert, cooperative and appears stated age Ears: normal TM's and external ear canals both ears Throat: lips, mucosa, and tongue normal; teeth and gums normal Neck: no adenopathy, no carotid bruit, supple, symmetrical, trachea midline and thyroid not enlarged, symmetric, no tenderness/mass/nodules Back: symmetric, no curvature. ROM normal. No CVA tenderness. Lungs: clear to auscultation bilaterally Heart: regular rate and rhythm, S1, S2 normal, no murmur, click, rub or gallop Abdomen: soft, non-tender; bowel sounds normal; no masses,  no organomegaly Pulses: 2+ and symmetric Skin: Skin color, texture, turgor normal. No rashes or lesions Lymph nodes: Cervical, supraclavicular, and axillary nodes normal.  Lab Results  Component Value Date   HGBA1C 6.0 06/19/2020   HGBA1C 5.9 03/19/2020    Lab Results  Component Value Date   CREATININE 1.01 (H) 01/15/2021   CREATININE 1.02 06/19/2020   CREATININE 0.88 03/19/2020    Lab Results  Component Value Date   WBC 7.1 01/15/2021   HGB 13.1 01/15/2021   HCT 39.6 01/15/2021   PLT 267 01/15/2021   GLUCOSE 82 01/15/2021   CHOL 213 (H) 06/19/2020   TRIG 116.0 06/19/2020   HDL 58.50 06/19/2020   LDLDIRECT 151 (H) 08/25/2016   LDLCALC 131 (H) 06/19/2020   ALT 8 01/15/2021   AST 12 01/15/2021   NA 146 01/15/2021   K 4.3 01/15/2021   CL 110 01/15/2021   CREATININE 1.01 (H) 01/15/2021   BUN 15 01/15/2021   CO2 20 01/15/2021   TSH 0.78 01/15/2021   INR 1.0 05/09/2018   HGBA1C 6.0 06/19/2020  DG Bone Density  Result Date: 07/06/2021 EXAM: DUAL X-RAY ABSORPTIOMETRY (DXA) FOR BONE MINERAL DENSITY IMPRESSION: Your patient Hatsue Sime completed a BMD test on 07/06/2021 using the [place] (software version: 14.10) manufactured by [employer]. The following summarizes the results of our evaluation.  Technologist: ECJ PATIENT BIOGRAPHICAL: Name: Asencion, Guisinger Patient ID: [id number] Birth Date: 1951/06/04 Height: 62.0 in. Gender: Female Exam Date: 07/06/2021 Weight: 152.4 lbs. Indications: Advanced Age, Hypothyroid, Parent Hip Fracture, Postmenopausal, Vit D Defic, Family Hist. (Parent hip fracture) Fractures: Toe Treatments: Calcium, Levothyroxine, Vitamin D DENSITOMETRY RESULTS: Site      Region     Measured Date Measured Age WHO Classification Young Adult T-score BMD         %Change vs. Previous Significant Change (*) AP Spine L1-L4 07/06/2021 71.0 Osteopenia -1.8 0.972 g/cm2 -3.3% Yes AP Spine L1-L4 04/24/2013 62.8 Osteopenia -1.5 1.005 g/cm2 - - DualFemur Total Left 07/06/2021 71.0 Normal -0.7 0.914 g/cm2 -10.7% Yes DualFemur Total Left 04/24/2013 62.8 Normal 0.1 1.023 g/cm2 - - DualFemur Total Mean 07/06/2021 71.0 Normal -0.6 0.930 g/cm2 -8.7% Yes DualFemur Total Mean 04/24/2013 62.8 Normal 0.1 1.019 g/cm2 - - ASSESSMENT: The BMD measured at AP Spine L1-L4 is 0.972 g/cm2 with a T-score of -1.8. This patient is considered osteopenic according to [facility]) criteria. The scan quality is good. Compared with prior study, there has been a significant significant decrease in the spine. Compared with prior study, there has been a significant decrease in the total hip. World Pharmacologist [facility]) criteria for post-menopausal, Caucasian Women: Normal:                   T-score at or above -1 SD Osteopenia/low bone mass: T-score between -1 and -2.5 SD Osteoporosis:             T-score at or below -2.5 SD RECOMMENDATIONS: 1. All patients should optimize calcium and vitamin D intake. 2. Consider FDA-approved medical therapies in postmenopausal women and men aged 60 years and older, based on the following: a. A hip or vertebral(clinical or morphometric) fracture b. T-score < -2.5 at the femoral neck or spine after appropriate evaluation to exclude secondary causes c. Low bone mass (T-score  between -1.0 and -2.5 at the femoral neck or spine) and a 10-year probability of a hip fracture > 3% or a 10-year probability of a major osteoporosis-related fracture > 20% based on the [place]-adapted WHO algorithm 3. Clinician judgment and/or patient preferences may indicate treatment for people with 10-year fracture probabilities above or below these levels FOLLOW-UP: People with diagnosed cases of osteoporosis or at high risk for fracture should have regular bone mineral density tests. For patients eligible for Medicare, routine testing is allowed once every 2 years. The testing frequency can be increased to one year for patients who have rapidly progressing disease, those who are receiving or discontinuing medical therapy to restore bone mass, or have additional risk factors. I have reviewed this report, and agree with the above findings. [facility] Dear [name], Your patient Katelyn Friedman completed a FRAX assessment on 07/06/2021 using the [place] (analysis version: 14.10) manufactured by [employer]. The following summarizes the results of our evaluation. PATIENT BIOGRAPHICAL: Name: Auriah, Hollings Patient ID: [id number] Birth Date: [birth year] Height:    62.0 in. Gender:     Female    [age]  Weight:    152.4 lbs. Ethnicity:  Black                            Exam Date: 07/06/2021 FRAX* RESULTS:  (version: 3.5) 10-year Probability of Fracture1 Major Osteoporotic Fracture2 Hip Fracture 5.0% 0.5% Population: [place] (Black) Risk Factors: Family Hist. (Parent hip fracture) Based on Femur (Left) Neck BMD 1 -The 10-year probability of fracture may be lower than reported if the patient has received treatment. 2 -Major Osteoporotic Fracture: Clinical Spine, Forearm, Hip or Shoulder *FRAX is a Materials engineer of the [employer] for Metabolic Bone Disease, a [place] (WHO) [facility]. ASSESSMENT: The probability  of a major osteoporotic fracture is 5.0% within the next ten years. The probability of a hip fracture is 0.5% within the next ten years. . I have reviewed this report and agree with the above findings. Mark A. Thornton Papas, M.D. [facility] Electronically Signed   By: Lavonia Dana M.D.   On: 07/06/2021 09:43    Assessment & Plan:   Problem List Items Addressed This Visit   None   I am having Timber Evalina Field "Gwen" maintain her Vitamin D, acetaminophen, levothyroxine, levothyroxine, atorvastatin, and acyclovir.  No orders of the defined types were placed in this encounter.   There are no discontinued medications.  Follow-up: No follow-ups on file.   [name], MD

## 2021-10-31 ENCOUNTER — Other Ambulatory Visit: Payer: Self-pay | Admitting: Internal Medicine

## 2021-10-31 DIAGNOSIS — R944 Abnormal results of kidney function studies: Secondary | ICD-10-CM

## 2021-10-31 LAB — COMPREHENSIVE METABOLIC PANEL
AG Ratio: 1.8 (calc) (ref 1.0–2.5)
ALT: 14 U/L (ref 6–29)
AST: 17 U/L (ref 10–35)
Albumin: 4.2 g/dL (ref 3.6–5.1)
Alkaline phosphatase (APISO): 58 U/L (ref 37–153)
BUN/Creatinine Ratio: 17 (calc) (ref 6–22)
BUN: 18 mg/dL (ref 7–25)
CO2: 26 mmol/L (ref 20–32)
Calcium: 9.3 mg/dL (ref 8.6–10.4)
Chloride: 105 mmol/L (ref 98–110)
Creat: 1.05 mg/dL — ABNORMAL HIGH (ref 0.60–1.00)
Globulin: 2.4 g/dL (calc) (ref 1.9–3.7)
Glucose, Bld: 84 mg/dL (ref 65–99)
Potassium: 4.9 mmol/L (ref 3.5–5.3)
Sodium: 141 mmol/L (ref 135–146)
Total Bilirubin: 0.5 mg/dL (ref 0.2–1.2)
Total Protein: 6.6 g/dL (ref 6.1–8.1)

## 2021-10-31 LAB — LIPID PANEL
Cholesterol: 136 mg/dL (ref <200)
HDL: 60 mg/dL (ref >=50)
LDL Cholesterol (Calc): 59 mg/dL (calc)
Non-HDL Cholesterol (Calc): 76 mg/dL (calc) (ref <130)
Total CHOL/HDL Ratio: 2.3 (calc) (ref <5.0)
Triglycerides: 89 mg/dL (ref <150)

## 2021-10-31 LAB — HEMOGLOBIN A1C
Hgb A1c MFr Bld: 4.4 % of total Hgb (ref <5.7)
Mean Plasma Glucose: 80 mg/dL
eAG (mmol/L): 4.4 mmol/L

## 2021-10-31 LAB — TSH: TSH: 2.98 mIU/L (ref 0.40–4.50)

## 2021-11-05 ENCOUNTER — Other Ambulatory Visit (INDEPENDENT_AMBULATORY_CARE_PROVIDER_SITE_OTHER): Payer: PPO

## 2021-11-05 ENCOUNTER — Other Ambulatory Visit: Payer: Self-pay

## 2021-11-05 DIAGNOSIS — E559 Vitamin D deficiency, unspecified: Secondary | ICD-10-CM

## 2021-11-05 DIAGNOSIS — R944 Abnormal results of kidney function studies: Secondary | ICD-10-CM | POA: Diagnosis not present

## 2021-11-06 LAB — BASIC METABOLIC PANEL
BUN: 16 mg/dL (ref 6–23)
CO2: 29 mEq/L (ref 19–32)
Calcium: 9.1 mg/dL (ref 8.4–10.5)
Chloride: 103 mEq/L (ref 96–112)
Creatinine, Ser: 0.95 mg/dL (ref 0.40–1.20)
GFR: 60.34 mL/min (ref >60.00)
Glucose, Bld: 80 mg/dL (ref 70–99)
Potassium: 4.5 mEq/L (ref 3.5–5.1)
Sodium: 139 mEq/L (ref 135–145)

## 2021-11-06 LAB — VITAMIN D 25 HYDROXY (VIT D DEFICIENCY, FRACTURES): VITD: 37.79 ng/mL (ref 30.00–100.00)

## 2021-12-15 ENCOUNTER — Telehealth: Payer: Self-pay | Admitting: Internal Medicine

## 2021-12-15 NOTE — Telephone Encounter (Signed)
Attempted to call pt. Mail box full.  °

## 2021-12-15 NOTE — Telephone Encounter (Signed)
Pt is calling in regards to increased anxiety. Pt stated she has been dealing with this for about two weeks now. Pt also began to become emotional on the phone. Pt states most of the time she's ok but lately it's been a lot harder for her. Due to appt availability pt is scheduled with PCP for 12/30 at 4:30. Pt declined being seen by someone else however pt will call back if things get worse before then to be scheduled with another provider if needed. Please give pt a call back!

## 2021-12-22 NOTE — Telephone Encounter (Signed)
Pt has an appt on 12/25/2021

## 2021-12-25 ENCOUNTER — Ambulatory Visit (INDEPENDENT_AMBULATORY_CARE_PROVIDER_SITE_OTHER): Payer: PPO | Admitting: Internal Medicine

## 2021-12-25 ENCOUNTER — Encounter: Payer: Self-pay | Admitting: Internal Medicine

## 2021-12-25 DIAGNOSIS — F411 Generalized anxiety disorder: Secondary | ICD-10-CM | POA: Diagnosis not present

## 2021-12-25 MED ORDER — SERTRALINE HCL 50 MG PO TABS: 50.0000 mg | ORAL_TABLET | Freq: Every day | ORAL | 0 refills | Status: DC

## 2021-12-25 NOTE — Patient Instructions (Addendum)
I recommend treatment for your anxiety with Zoloft (sertraline is the generic name).  Please start the sertraline at 1/2 tablet daily  With a full breakfast  for the first week to avoid nausea.  You can increase to a full tablet after 46 days if you havenot developed side effects of nausea.  If the sertraline seems to have no effect after 2 weeks at the 50 mg dose,  you may increase the dose to 100 mg and PLEASE let me know how you are doing  through MyChart    I HIGHLY RECOMMEND A [address] [place]   I would like to have you return in 6 weeks      May God   bless you with His grace,  a peace that passes all understanding, and the fellowship of Christ  in the [place] Year!  Regards,   Deborra Medina, MD

## 2021-12-25 NOTE — Progress Notes (Signed)
Telephone  Note  This visit type was conducted due to national recommendations for restrictions regarding the COVID-19 pandemic (e.g. social distancing).  This format is felt to be most appropriate for this patient at this time.  All issues noted in this document were discussed and addressed.  No physical exam was performed (except for noted visual exam findings with Video Visits).   I connected withNAME@ on 12/25/21 at  4:30 PM EST by telephone and verified that I am speaking with the correct person using two identifiers. Location patient: home Location provider: work or home office Persons participating in the virtual visit: patient, provider  I discussed the limitations, risks, security and privacy concerns of performing an evaluation and management service by telephone and the availability of in person appointments. I also discussed with the patient that there may be a patient responsible charge related to this service. The patient expressed understanding and agreed to proceed.  Reason for visit: anxiety  HPI:   70 yr old female presents with increased anxiety .  Aggravating events include  her grandson's recent involuntary commitment for suicidality.   She has been crying a lot,  easily irritated  and not sleeping well.  She is happy in her marriage;  her husband has been very supportive but she finds herself being argumentative and critical of him and needing more time to herself.  She is not using any recreational drugs and denies use of alcohol.  She denies panic attacks.    ROS: See pertinent positives and negatives per HPI.  Past Medical History:  Diagnosis Date   Allergic rhinitis    Arthritis    Family history of adverse reaction to anesthesia    PONV   GERD (gastroesophageal reflux disease)    Herpes simplex    History of colon polyps    IBS (irritable bowel syndrome)    PONV (postoperative nausea and vomiting)    Thyroid disease    graves disease    Past Surgical  History:  Procedure Laterality Date   BREAST BIOPSY Right 2014   negative   CHOLECYSTECTOMY N/A 09/21/2017   Procedure: LAPAROSCOPIC CHOLECYSTECTOMY WITH INTRAOPERATIVE CHOLANGIOGRAM;  Surgeon: Robert Bellow, MD;  Location: [facility];  Service: General;  Laterality: N/A;   COLONOSCOPY  05/07/2014   5 mm hyperplastic polyp removed from the rectosigmoid.  Maternal aunt with history colon cancer.  10-year follow-up planned.   KNEE ARTHROSCOPY  1992   KNEE ARTHROSCOPY Right 06/08/2016   Procedure: ARTHROSCOPY KNEE- partial medial minisectomy and condroplasty;  Surgeon: Melrose Nakayama, MD;  Location: [place];  Service: Orthopedics;  Laterality: Right;   ROTATOR CUFF REPAIR  2006   Margaretmary Eddy    TONSILLECTOMY     TUBAL LIGATION      Family History  Problem Relation Age of Onset   Stroke Mother    Alzheimer's disease Mother    Hypertension Mother    Colon polyps Maternal Aunt    Cancer Maternal Aunt        colon   Breast cancer Maternal Grandmother    Breast cancer Other        m great GM    SOCIAL HX:  reports that she has never smoked. She has never used smokeless tobacco. She reports that she does not drink alcohol and does not use drugs.    Current Outpatient Medications:    acetaminophen (TYLENOL) 500 MG tablet, Take 500-1,000 mg by mouth every 6 (six) hours as needed (for pain/headaches.)., Disp: ,  Rfl:    acyclovir (ZOVIRAX) 400 MG tablet, Take 1 tablet (400 mg total) by mouth daily., Disp: 90 tablet, Rfl: 0   atorvastatin (LIPITOR) 20 MG tablet, Take 1 tablet (20 mg total) by mouth daily., Disp: 90 tablet, Rfl: 3   Cholecalciferol (VITAMIN D) 2000 units tablet, Take 2,000 Units by mouth daily., Disp: , Rfl:    levothyroxine (SYNTHROID) 50 MCG tablet, Take 50 mcg by mouth daily before breakfast. Pt is taking 50 mg 4 days a week, Disp: , Rfl:    levothyroxine (SYNTHROID) 75 MCG tablet, Take 75 mcg by mouth daily before breakfast. Pt is taking 75 mg 3 days a week, Disp: , Rfl:     sertraline (ZOLOFT) 50 MG tablet, Take 1 tablet (50 mg total) by mouth daily after breakfast., Disp: 90 tablet, Rfl: 0  EXAM:   General impression: alert, cooperative and articulate.  No signs of being in distress  Lungs: speech is fluent sentence length suggests that patient is not short of breath and not punctuated by cough, sneezing or sniffing. [name]   Psych: affect normal.  speech is articulate and non pressured .  Denies suicidal thoughts   ASSESSMENT AND PLAN:  Discussed the following assessment and plan:  Generalized anxiety disorder  Generalized anxiety disorder Reviewed previous use of lexapro several years ago  And given the option to resume it vs a trial of a different SSRI.  She endorses fatigue and lack of motivation;  Will initiate trial of sertraline starting at 25 mg daily with food. RTC 6 week s     I discussed the assessment and treatment plan with the patient. The patient was provided an opportunity to ask questions and all were answered. The patient agreed with the plan and demonstrated an understanding of the instructions.   The patient was advised to call back or seek an in-person evaluation if the symptoms worsen or if the condition fails to improve as anticipated.   I spent 30 minutes dedicated to the care of this patient on the date of this encounter to include pre-visit review of his medical history,  Face-to-face time with the patient , and post visit ordering of testing and therapeutics.    Katelyn Mc, MD

## 2021-12-26 NOTE — Assessment & Plan Note (Addendum)
Reviewed previous use of lexapro several years ago  And given the option to resume it vs a trial of a different SSRI.  She endorses fatigue and lack of motivation;  Will initiate trial of sertraline starting at 25 mg daily with food. RTC 6 week s

## 2021-12-29 ENCOUNTER — Ambulatory Visit: Payer: PPO

## 2022-01-22 ENCOUNTER — Telehealth: Payer: Self-pay | Admitting: Internal Medicine

## 2022-01-22 NOTE — Telephone Encounter (Signed)
Pt called in requesting to get a shingle shot. There are no orders placed in the system. Pt stated that she would like the first available appt. Pt requesting callback.

## 2022-01-22 NOTE — Telephone Encounter (Signed)
Spoke with pt to advise her that she would need to get her shingles vaccine at the pharmacy because Medicare will not pay for it to be done at our office. The pt stated that she called [facility] and they told her that she could have it done at either her primary care office or at a pharmacy. Advised pt that I would check into this and let her know.

## 2022-01-29 ENCOUNTER — Other Ambulatory Visit: Payer: Self-pay | Admitting: Internal Medicine

## 2022-03-17 ENCOUNTER — Encounter: Payer: Self-pay | Admitting: Podiatry

## 2022-03-17 ENCOUNTER — Ambulatory Visit: Payer: PPO | Admitting: Podiatry

## 2022-03-17 ENCOUNTER — Other Ambulatory Visit: Payer: Self-pay

## 2022-03-17 DIAGNOSIS — M7752 Other enthesopathy of left foot: Secondary | ICD-10-CM | POA: Diagnosis not present

## 2022-03-17 DIAGNOSIS — D2372 Other benign neoplasm of skin of left lower limb, including hip: Secondary | ICD-10-CM

## 2022-03-17 DIAGNOSIS — E890 Postprocedural hypothyroidism: Secondary | ICD-10-CM | POA: Insufficient documentation

## 2022-03-17 DIAGNOSIS — D2371 Other benign neoplasm of skin of right lower limb, including hip: Secondary | ICD-10-CM

## 2022-03-17 DIAGNOSIS — M7751 Other enthesopathy of right foot: Secondary | ICD-10-CM | POA: Diagnosis not present

## 2022-03-17 NOTE — Progress Notes (Signed)
?Subjective:  ?Patient ID: Katelyn Friedman, female    DOB: 1951/05/29,  MRN: [id number] ?HPI ?Chief Complaint  ?Patient presents with  ? Foot Pain  ?  Sub 5th MPJ and 5th met base lateral right - callused areas, lateral foot right tender  ? New Patient (Initial Visit)  ?  Est pt 05/2019  ? ? ?71 y.o. female presents with the above complaint.  ? ?ROS: Denies fever chills nausea vomiting muscle aches pains calf pain back pain chest pain shortness of breath ? ?Past Medical History:  ?Diagnosis Date  ? Allergic rhinitis   ? Arthritis   ? Family history of adverse reaction to anesthesia   ? PONV  ? GERD (gastroesophageal reflux disease)   ? Herpes simplex   ? History of colon polyps   ? IBS (irritable bowel syndrome)   ? PONV (postoperative nausea and vomiting)   ? Thyroid disease   ? graves disease  ? ?Past Surgical History:  ?Procedure Laterality Date  ? BREAST BIOPSY Right 2014  ? negative  ? CHOLECYSTECTOMY N/A 09/21/2017  ? Procedure: LAPAROSCOPIC CHOLECYSTECTOMY WITH INTRAOPERATIVE CHOLANGIOGRAM;  Surgeon: Robert Bellow, MD;  Location: [facility];  Service: General;  Laterality: N/A;  ? COLONOSCOPY  05/07/2014  ? 5 mm hyperplastic polyp removed from the rectosigmoid.  Maternal aunt with history colon cancer.  10-year follow-up planned.  ? KNEE ARTHROSCOPY  1992  ? KNEE ARTHROSCOPY Right 06/08/2016  ? Procedure: ARTHROSCOPY KNEE- partial medial minisectomy and condroplasty;  Surgeon: Melrose Nakayama, MD;  Location: [place];  Service: Orthopedics;  Laterality: Right;  ? ROTATOR CUFF REPAIR  2006  ? Margaretmary Eddy   ? TONSILLECTOMY    ? TUBAL LIGATION    ? ? ?Current Outpatient Medications:  ?  acetaminophen (TYLENOL) 500 MG tablet, Take 500-1,000 mg by mouth every 6 (six) hours as needed (for pain/headaches.)., Disp: , Rfl:  ?  acyclovir (ZOVIRAX) 400 MG tablet, Take 1 tablet by mouth once daily, Disp: 90 tablet, Rfl: 0 ?  atorvastatin (LIPITOR) 20 MG tablet, Take 1 tablet (20 mg total) by mouth daily., Disp: 90 tablet,  Rfl: 3 ?  Cholecalciferol (VITAMIN D) 2000 units tablet, Take 2,000 Units by mouth daily., Disp: , Rfl:  ?  levothyroxine (SYNTHROID) 50 MCG tablet, Take 50 mcg by mouth daily before breakfast. Pt is taking 50 mg 4 days a week, Disp: , Rfl:  ?  levothyroxine (SYNTHROID) 75 MCG tablet, Take 75 mcg by mouth daily before breakfast. Pt is taking 75 mg 3 days a week, Disp: , Rfl:  ?  sertraline (ZOLOFT) 50 MG tablet, Take 1 tablet (50 mg total) by mouth daily after breakfast., Disp: 90 tablet, Rfl: 0 ? ?Allergies  ?Allergen Reactions  ? Influenza Vaccines Swelling and Other (See Comments)  ?  Redness, Fever  ? Amoxicillin Diarrhea  ? Codeine Nausea Only  ? Mobic [Meloxicam] Itching and Rash  ?  Nausea ?Vomiting  ? ?Review of Systems ?Objective:  ?There were no vitals filed for this visit. ? ?General: Well developed, nourished, in no acute distress, alert and oriented x3  ? ?Dermatological: Skin is warm, dry and supple bilateral. Nails x 10 are well maintained; remaining integument appears unremarkable at this time. There are no open sores, no preulcerative lesions, no rash or signs of infection present.  Painful porokeratotic lesions plantar aspect of the bilateral foot and the lateral aspect of the fifth metatarsal base of the right foot. ? ?Vascular: Dorsalis Pedis artery and Posterior Tibial  artery pedal pulses are 2/4 bilateral with immedate capillary fill time. Pedal hair growth present. No varicosities and no lower extremity edema present bilateral.  ? ?Neruologic: Grossly intact via light touch bilateral. Vibratory intact via tuning fork bilateral. Protective threshold with Semmes Wienstein monofilament intact to all pedal sites bilateral. Patellar and Achilles deep tendon reflexes 2+ bilateral. No Babinski or clonus noted bilateral.  ? ?Musculoskeletal: No gross boney pedal deformities bilateral. No pain, crepitus, or limitation noted with foot and ankle range of motion bilateral. Muscular strength 5/5 in all  groups tested bilateral. ? ?Gait: Unassisted, Nonantalgic.  ? ? ?Radiographs: ? ?None taken ? ?Assessment & Plan:  ? ?Assessment: Benign skin lesions ? ?Plan: Debrided benign skin lesion ? ? ? ? ?Seana Underwood T. Kenton, DPM ?

## 2022-04-19 DIAGNOSIS — E890 Postprocedural hypothyroidism: Secondary | ICD-10-CM | POA: Diagnosis not present

## 2022-04-30 ENCOUNTER — Encounter: Payer: Self-pay | Admitting: Family Medicine

## 2022-04-30 ENCOUNTER — Ambulatory Visit (INDEPENDENT_AMBULATORY_CARE_PROVIDER_SITE_OTHER): Payer: PPO | Admitting: Family Medicine

## 2022-04-30 DIAGNOSIS — J988 Other specified respiratory disorders: Secondary | ICD-10-CM

## 2022-04-30 DIAGNOSIS — J069 Acute upper respiratory infection, unspecified: Secondary | ICD-10-CM | POA: Insufficient documentation

## 2022-04-30 MED ORDER — DOXYCYCLINE HYCLATE 100 MG PO TABS: 100.0000 mg | ORAL_TABLET | Freq: Two times a day (BID) | ORAL | 0 refills | Status: DC

## 2022-04-30 MED ORDER — BENZONATATE 200 MG PO CAPS: 200.0000 mg | ORAL_CAPSULE | Freq: Two times a day (BID) | ORAL | 0 refills | Status: DC | PRN

## 2022-04-30 NOTE — Assessment & Plan Note (Addendum)
Symptoms and duration would point towards a sinus infection.  We will obtain a point-of-care COVID test.  If this is negative then it is unlikely to be related to COVID given how long her symptoms have been going on.  We will treat her for sinus infection with doxycycline.  I discussed the risk of diarrhea and skin sensitivity to light with this medication.  If she is not improving she will let us know.  If she worsens she will be reevaluated. Tessalon sent in for cough.  ?

## 2022-04-30 NOTE — Progress Notes (Signed)
?Tommi Rumps, MD ?Phone: [phone] ? ?Katelyn Friedman is a 71 y.o. female who presents today for same-day visit. ? ?Respiratory infection: Patient notes this started a week ago.  Started with sore throat and postnasal drip.  She developed headache and sinus congestion.  She has had a productive cough with rhinorrhea.  She is blowing greenish-yellowish mucus out of her nose.  No fevers.  No shortness of breath.  No taste or smell disturbances.  No known COVID exposures.  She has not tested herself for COVID.  She notes this feels like a sinus infection.  She feels as though her congestion is not improving. ? ?Social History  ? ?Tobacco Use  ?Smoking Status Never  ?Smokeless Tobacco Never  ? ? ?Current Outpatient Medications on File Prior to Visit  ?Medication Sig Dispense Refill  ? acetaminophen (TYLENOL) 500 MG tablet Take 500-1,000 mg by mouth every 6 (six) hours as needed (for pain/headaches.).    ? acyclovir (ZOVIRAX) 400 MG tablet Take 1 tablet by mouth once daily 90 tablet 0  ? aspirin 81 MG chewable tablet 1 tablet    ? atorvastatin (LIPITOR) 20 MG tablet Take 1 tablet (20 mg total) by mouth daily. 90 tablet 3  ? Cholecalciferol (VITAMIN D) 2000 units tablet Take 2,000 Units by mouth daily.    ? levothyroxine (SYNTHROID) 50 MCG tablet Take 50 mcg by mouth daily before breakfast. Pt is taking 50 mg 4 days a week    ? levothyroxine (SYNTHROID) 75 MCG tablet Take 75 mcg by mouth daily before breakfast. Pt is taking 75 mg 3 days a week    ? sertraline (ZOLOFT) 50 MG tablet Take 1 tablet (50 mg total) by mouth daily after breakfast. 90 tablet 0  ? ?No current facility-administered medications on file prior to visit.  ? ? ? ?ROS see history of present illness ? ?Objective ? ?Physical Exam ?Vitals:  ? 04/30/22 1534  ?BP: 130/70  ?Pulse: 86  ?Temp: 98.5 ?F (36.9 ?C)  ?SpO2: 95%  ? ? ?BP Readings from Last 3 Encounters:  ?04/30/22 130/70  ?10/30/21 106/62  ?04/14/21 124/76  ? ?Wt Readings from Last 3  Encounters:  ?04/30/22 163 lb 9.6 oz (74.2 kg)  ?12/25/21 158 lb (71.7 kg)  ?10/30/21 158 lb 6.4 oz (71.8 kg)  ? ? ?Physical Exam ?Constitutional:   ?   General: She is not in acute distress. ?   Appearance: She is not diaphoretic.  ?HENT:  ?   Head:  ?   Comments: Maxillary sinus tenderness to percussion ?   Right Ear: Tympanic membrane normal.  ?   Ears:  ?   Comments: Left TM obscured by cerumen ?   Mouth/Throat:  ?   Mouth: Mucous membranes are moist.  ?   Pharynx: Oropharynx is clear.  ?Cardiovascular:  ?   Rate and Rhythm: Normal rate and regular rhythm.  ?   Heart sounds: Normal heart sounds.  ?Pulmonary:  ?   Effort: Pulmonary effort is normal.  ?   Breath sounds: Normal breath sounds.  ?Skin: ?   General: Skin is warm and dry.  ?Neurological:  ?   Mental Status: She is alert.  ? ? ? ?Assessment/Plan: Please see individual problem list. ? ?Problem List Items Addressed This Visit   ? ? Respiratory infection  ?  Symptoms and duration would point towards a sinus infection.  We will obtain a point-of-care COVID test.  If this is negative then it is unlikely to be  related to COVID given how long her symptoms have been going on.  We will treat her for sinus infection with doxycycline.  I discussed the risk of diarrhea and skin sensitivity to light with this medication.  If she is not improving she will let us know.  If she worsens she will be reevaluated. Tessalon sent in for cough.  ? ?  ?  ? Relevant Medications  ? doxycycline (VIBRA-TABS) 100 MG tablet  ? benzonatate (TESSALON) 200 MG capsule  ? Other Relevant Orders  ? [place] COVID-19  ? ? ?Return if symptoms worsen or fail to improve. ? ? ?Tommi Rumps, MD ?[place] ? ?

## 2022-04-30 NOTE — Patient Instructions (Signed)
Nice to meet you. ?We will treat you for sinus infection with doxycycline.  Please monitor for diarrhea with this.  Please wear good sun protection while you are outside given the risk of skin sensitivity to the sun while on this medication.  If you are not improving please let us know.  If you worsen at all please be reevaluated. ?

## 2022-05-03 LAB — POC COVID19 BINAXNOW: SARS Coronavirus 2 Ag: NEGATIVE

## 2022-05-05 ENCOUNTER — Other Ambulatory Visit: Payer: Self-pay | Admitting: Internal Medicine

## 2022-05-10 ENCOUNTER — Telehealth: Payer: Self-pay | Admitting: Internal Medicine

## 2022-05-10 NOTE — Telephone Encounter (Signed)
Copied from [place] [id number]. Topic: Medicare AWV ?>> May 10, 2022 11:13 AM Harris-Coley, Hannah Beat wrote: ?Reason for CRM: Left message for patient to schedule Annual Wellness Visit.  Please schedule with Nurse Health Advisor Denisa O'Brien-Blaney, LPN at [facility].  Please call [phone] ask for Juliann Pulse ?

## 2022-05-11 DIAGNOSIS — M25862 Other specified joint disorders, left knee: Secondary | ICD-10-CM | POA: Diagnosis not present

## 2022-05-18 ENCOUNTER — Other Ambulatory Visit: Payer: Self-pay | Admitting: Internal Medicine

## 2022-05-18 DIAGNOSIS — Z1231 Encounter for screening mammogram for malignant neoplasm of breast: Secondary | ICD-10-CM

## 2022-06-09 DIAGNOSIS — M25862 Other specified joint disorders, left knee: Secondary | ICD-10-CM | POA: Diagnosis not present

## 2022-06-14 DIAGNOSIS — M1712 Unilateral primary osteoarthritis, left knee: Secondary | ICD-10-CM | POA: Diagnosis not present

## 2022-06-22 ENCOUNTER — Telehealth: Payer: Self-pay | Admitting: Internal Medicine

## 2022-06-22 LAB — TSH: TSH: 2.04 (ref 0.41–5.90)

## 2022-06-22 NOTE — Telephone Encounter (Signed)
The patient is requesting a new prescription for both due to them having to change vendors for these particular medications.   Levothyroxine (SYNTHROID) 75 MCG tablet levothyroxine (SYNTHROID) 50 MCG tablet

## 2022-06-23 NOTE — Telephone Encounter (Signed)
Spoke with pt and she stated that the pharmacy will not fill because they need permission to change manufacturer. Permission has been given to pharmacy.

## 2022-07-12 ENCOUNTER — Telehealth: Payer: Self-pay

## 2022-07-12 MED ORDER — LEVOTHYROXINE SODIUM 50 MCG PO TABS: ORAL_TABLET | ORAL | 1 refills | Status: DC

## 2022-07-12 MED ORDER — LEVOTHYROXINE SODIUM 75 MCG PO TABS: ORAL_TABLET | ORAL | 1 refills | Status: DC

## 2022-07-12 MED ORDER — ATORVASTATIN CALCIUM 20 MG PO TABS: 20.0000 mg | ORAL_TABLET | Freq: Every day | ORAL | 3 refills | Status: DC

## 2022-07-12 NOTE — Telephone Encounter (Signed)
Spoke with pt and she stated that Dr. Chalmers Cater has released her from his care because her TSH level has been the same for so long. Pt is needing a refill of both the Levothyroxine 75 mcg and 50 mcg. Is it okay to refill these?

## 2022-07-13 ENCOUNTER — Ambulatory Visit
Admission: RE | Admit: 2022-07-13 | Discharge: 2022-07-13 | Disposition: A | Discharge status: Home / Self Care | Payer: PPO | Source: Ambulatory Visit | Attending: Internal Medicine | Admitting: Internal Medicine

## 2022-07-13 DIAGNOSIS — Z1231 Encounter for screening mammogram for malignant neoplasm of breast: Secondary | ICD-10-CM | POA: Insufficient documentation

## 2022-07-19 ENCOUNTER — Ambulatory Visit: Payer: PPO | Admitting: Internal Medicine

## 2022-07-19 MED ORDER — LEVOTHYROXINE SODIUM 50 MCG PO TABS: ORAL_TABLET | ORAL | 0 refills | Status: DC

## 2022-07-19 NOTE — Addendum Note (Signed)
Addended by: Adair Laundry on: 07/19/2022 02:45 PM   Modules accepted: Orders

## 2022-07-19 NOTE — Telephone Encounter (Signed)
Pt daughter called in stating that pt is on vacation and forgot medication (levothyroxine (SYNTHROID) 50 MCG tablet) at home... Pt daughter is request refill of 5 pills until they get back home... Pt daughter is requesting for medication to be sent to [facility] at [facility]... Pt daughter is requesting callback.Marland KitchenMarland Kitchen

## 2022-07-19 NOTE — Telephone Encounter (Signed)
Medication has been refilled and pt is aware.  

## 2022-07-27 ENCOUNTER — Encounter: Payer: Self-pay | Admitting: Internal Medicine

## 2022-07-27 ENCOUNTER — Ambulatory Visit (INDEPENDENT_AMBULATORY_CARE_PROVIDER_SITE_OTHER): Payer: PPO | Admitting: Internal Medicine

## 2022-07-27 VITALS — BP 118/80 | HR 66 | Temp 98.1°F | Ht 62.0 in | Wt 157.2 lb

## 2022-07-27 DIAGNOSIS — E039 Hypothyroidism, unspecified: Secondary | ICD-10-CM

## 2022-07-27 DIAGNOSIS — M25562 Pain in left knee: Secondary | ICD-10-CM

## 2022-07-27 DIAGNOSIS — E559 Vitamin D deficiency, unspecified: Secondary | ICD-10-CM

## 2022-07-27 DIAGNOSIS — F411 Generalized anxiety disorder: Secondary | ICD-10-CM | POA: Diagnosis not present

## 2022-07-27 DIAGNOSIS — E663 Overweight: Secondary | ICD-10-CM

## 2022-07-27 DIAGNOSIS — R7303 Prediabetes: Secondary | ICD-10-CM

## 2022-07-27 DIAGNOSIS — Z860100 Personal history of colon polyps, unspecified: Secondary | ICD-10-CM

## 2022-07-27 DIAGNOSIS — E782 Mixed hyperlipidemia: Secondary | ICD-10-CM

## 2022-07-27 DIAGNOSIS — I700 Atherosclerosis of aorta: Secondary | ICD-10-CM | POA: Diagnosis not present

## 2022-07-27 LAB — COMPREHENSIVE METABOLIC PANEL
ALT: 9 U/L (ref 0–35)
AST: 15 U/L (ref 0–37)
Albumin: 4.3 g/dL (ref 3.5–5.2)
Alkaline Phosphatase: 52 U/L (ref 39–117)
BUN: 16 mg/dL (ref 6–23)
CO2: 25 mEq/L (ref 19–32)
Calcium: 9.3 mg/dL (ref 8.4–10.5)
Chloride: 105 mEq/L (ref 96–112)
Creatinine, Ser: 1.07 mg/dL (ref 0.40–1.20)
GFR: 52.05 mL/min — ABNORMAL LOW (ref >60.00)
Glucose, Bld: 90 mg/dL (ref 70–99)
Potassium: 4.3 mEq/L (ref 3.5–5.1)
Sodium: 139 mEq/L (ref 135–145)
Total Bilirubin: 0.8 mg/dL (ref 0.2–1.2)
Total Protein: 6.7 g/dL (ref 6.0–8.3)

## 2022-07-27 LAB — TSH: TSH: 5.39 u[IU]/mL (ref 0.35–5.50)

## 2022-07-27 LAB — VITAMIN D 25 HYDROXY (VIT D DEFICIENCY, FRACTURES): VITD: 28.67 ng/mL — ABNORMAL LOW (ref 30.00–100.00)

## 2022-07-27 MED ORDER — ZOSTER VAC RECOMB ADJUVANTED 50 MCG/0.5ML IM SUSR: 0.5000 mL | Freq: Once | INTRAMUSCULAR | 0 refills | Status: AC

## 2022-07-27 NOTE — Assessment & Plan Note (Signed)
Secondary to injury,  With large medial effusion.  S/p steroid injection July 21.  Needs effusion aspirated ,  [facility] managing

## 2022-07-27 NOTE — Assessment & Plan Note (Signed)
Managing symptoms without medications per patient preference.

## 2022-07-27 NOTE — Assessment & Plan Note (Signed)
I have congratulated her in maintaining her reduction of   BMI and encouraged  Continued weight loss with goal of 10% of body weigh over the next 6 months using a low glycemic index diet and regular exercise a minimum of 5 days per week.

## 2022-07-27 NOTE — Assessment & Plan Note (Signed)
Secondary to Graves Disease.  Released by Endocrine   Current regimen is  50 mcg x 5,  75 mcg x 2

## 2022-07-27 NOTE — Assessment & Plan Note (Signed)
Seen on recent CT and reviewed I she is tolerating  Continued use of  statin therapy given documented evidence of moderate  atherosclerosis in the aorta

## 2022-07-27 NOTE — Patient Instructions (Signed)
Glad you are feeling less anxious and sleeping better   No changes unless labs indicate a need to do so

## 2022-07-27 NOTE — Assessment & Plan Note (Signed)
Hyperplastic,  Next screening due in 2025

## 2022-07-27 NOTE — Progress Notes (Signed)
Patient ID: Katelyn Friedman, female    DOB: [birth year]  Age: 71 y.o. MRN: [id number]  The patient is here for follow up and  management of other chronic and acute problems.   The risk factors are reflected in the social history.  The roster of all physicians providing medical care to patient - is listed in the Snapshot section of the chart.  Activities of daily living:  The patient is 100% independent in all ADLs: dressing, toileting, feeding as well as independent mobility  Home safety : The patient has smoke detectors in the home. They wear seatbelts.  There are no firearms at home. There is no violence in the home.   There is no risks for hepatitis, STDs or HIV. There is no   history of blood transfusion. They have no travel history to infectious disease endemic areas of the world.  The patient has seen their dentist in the last six month. They have seen their eye doctor in the last year. They admit to slight hearing difficulty with regard to whispered voices and some television programs.  They have deferred audiologic testing in the last year.  They do not  have excessive sun exposure. Discussed the need for sun protection: hats, long sleeves and use of sunscreen if there is significant sun exposure.   Diet: the importance of a healthy diet is discussed. They do have a healthy diet.  The benefits of regular aerobic exercise were discussed. She walks 4 times per week ,  20 minutes.   Depression screen: there are no signs or vegative symptoms of depression- irritability, change in appetite, anhedonia, sadness/tearfullness.  Cognitive assessment: the patient manages all their financial and personal affairs and is actively engaged. They could relate day,date,year and events; recalled 2/3 objects at 3 minutes; performed clock-face test normally.  The following portions of the patient's history were reviewed and updated as appropriate: allergies, current medications, past family history, past  medical history,  past surgical history, past social history  and problem list.  Visual acuity was not assessed per patient preference since she has regular follow up with her ophthalmologist. Hearing and body mass index were assessed and reviewed.   During the course of the visit the patient was educated and counseled about appropriate screening and preventive services including : fall prevention , diabetes screening, nutrition counseling, colorectal cancer screening, and recommended immunizations.    CC: The primary encounter diagnosis was Hypothyroidism, unspecified type. Diagnoses of Mixed hyperlipidemia, Prediabetes, Vitamin D deficiency, History of colon polyps, Left anterior knee pain, Overweight (BMI 25.0-29.9), Abdominal aortic atherosclerosis ([place]), and Generalized anxiety disorder were also pertinent to this visit.   Just returned from [place] beach for a 4 generation family vacation with [place] family   GAD:  occasional anxiety for no reason.  Uses prayer and meditation , not requiring meds.  Sleeping well until woken up to void.  1-2 voids per night. Avoids caffeine except morning  coffee. Walking daily. .  Had a cortisone injection in left knee by [facility] on July 21 one month ago, which has relieved her knee pain until she walked on the beach.    History Detra has a past medical history of Allergic rhinitis, Arthritis, Family history of adverse reaction to anesthesia, GERD (gastroesophageal reflux disease), Herpes simplex, History of colon polyps, IBS (irritable bowel syndrome), PONV (postoperative nausea and vomiting), and Thyroid disease.   She has a past surgical history that includes Rotator cuff repair (2006); Knee  arthroscopy (1992); Tubal ligation; Tonsillectomy; Colonoscopy (05/07/2014); Breast biopsy (Right, 2014); Knee arthroscopy (Right, 06/08/2016); and Cholecystectomy (N/A, 09/21/2017).   Her family history includes Alzheimer's disease in her mother;  Breast cancer in her maternal grandmother and another family member; Cancer in her maternal aunt; Colon polyps in her maternal aunt; Hypertension in her mother; Stroke in her mother.She reports that she has never smoked. She has never used smokeless tobacco. She reports that she does not drink alcohol and does not use drugs.  Outpatient Medications Prior to Visit  Medication Sig Dispense Refill   acetaminophen (TYLENOL) 500 MG tablet Take 500-1,000 mg by mouth every 6 (six) hours as needed (for pain/headaches.).     acyclovir (ZOVIRAX) 400 MG tablet Take 1 tablet by mouth once daily 90 tablet 0   aspirin 81 MG chewable tablet 1 tablet     atorvastatin (LIPITOR) 20 MG tablet Take 1 tablet (20 mg total) by mouth daily. 90 tablet 3   Cholecalciferol (VITAMIN D) 2000 units tablet Take 2,000 Units by mouth daily.     levothyroxine (SYNTHROID) 50 MCG tablet Take 1 tablet by mouth in the morning on an empty stomach on Monday thru Friday. 5 tablet 0   levothyroxine (SYNTHROID) 75 MCG tablet Take 1 tablet by mouth in the morning on an empty stomach on Saturday and Sunday. 90 tablet 1   benzonatate (TESSALON) 200 MG capsule Take 1 capsule (200 mg total) by mouth 2 (two) times daily as needed for cough. (Patient not taking: Reported on 07/27/2022) 20 capsule 0   doxycycline (VIBRA-TABS) 100 MG tablet Take 1 tablet (100 mg total) by mouth 2 (two) times daily. (Patient not taking: Reported on 07/27/2022) 14 tablet 0   sertraline (ZOLOFT) 50 MG tablet Take 1 tablet (50 mg total) by mouth daily after breakfast. (Patient not taking: Reported on 07/27/2022) 90 tablet 0   No facility-administered medications prior to visit.    Review of Systems  Patient denies headache, fevers, malaise, unintentional weight loss, skin rash, eye pain, sinus congestion and sinus pain, sore throat, dysphagia,  hemoptysis , cough, dyspnea, wheezing, chest pain, palpitations, orthopnea, edema, abdominal pain, nausea, melena, diarrhea,  constipation, flank pain, dysuria, hematuria, urinary  Frequency, nocturia, numbness, tingling, seizures,  Focal weakness, Loss of consciousness,  Tremor, insomnia, depression, anxiety, and suicidal ideation.     Objective:  BP 118/80 (BP Location: Left Arm, Patient Position: Sitting, Cuff Size: Normal)   Pulse 66   Temp 98.1 F (36.7 C) (Oral)   Ht 5' 2"  (1.575 m)   Wt 157 lb 3.2 oz (71.3 kg)   SpO2 98%   BMI 28.75 kg/m   Physical Exam  General appearance: alert, cooperative and appears stated age Ears: normal TM's and external ear canals both ears Throat: lips, mucosa, and tongue normal; teeth and gums normal Neck: no adenopathy, no carotid bruit, supple, symmetrical, trachea midline and thyroid not enlarged, symmetric, no tenderness/mass/nodules Back: symmetric, no curvature. ROM normal. No CVA tenderness. Lungs: clear to auscultation bilaterally Heart: regular rate and rhythm, S1, S2 normal, no murmur, click, rub or gallop Abdomen: soft, non-tender; bowel sounds normal; no masses,  no organomegaly Pulses: 2+ and symmetric Skin: Skin color, texture, turgor normal. No rashes or lesions Lymph nodes: Cervical, supraclavicular, and axillary nodes normal.  Assessment & Plan:   Problem List Items Addressed This Visit     Vitamin D deficiency   Relevant Orders   VITAMIN D 25 Hydroxy (Vit-D Deficiency, Fractures) (Completed)   Overweight (BMI  25.0-29.9)    I have congratulated her in maintaining her reduction of   BMI and encouraged  Continued weight loss with goal of 10% of body weigh over the next 6 months using a low glycemic index diet and regular exercise a minimum of 5 days per week.        Left anterior knee pain    Secondary to injury,  With large medial effusion.  S/p steroid injection July 21.  Needs effusion aspirated ,  [facility] managing       Hypothyroidism - Primary    Secondary to Graves Disease.  Released by Endocrine   Current regimen is  50 mcg x 5,  75  mcg x 2       Relevant Orders   TSH (Completed)   History of colon polyps    Hyperplastic,  Next screening due in 2025      Generalized anxiety disorder    Managing symptoms without medications per patient preference.       Abdominal aortic atherosclerosis ([place])    Seen on recent CT and reviewed I she is tolerating  Continued use of  statin therapy given documented evidence of moderate  atherosclerosis in the aorta       Other Visit Diagnoses     Mixed hyperlipidemia       Relevant Orders   Lipid Panel w/reflex Direct LDL (Completed)   Comp Met (CMET) (Completed)   Prediabetes          I provided  30 minutes  during this encounter reviewing patient's  last visit with her orthopedist and her endocrinologist,  current problems and past surgeries, labs and imaging studies, providing counseling on the above mentioned problems I n a face to face visit  , and coordination  of care .  Meds ordered this encounter  Medications   Zoster Vaccine Adjuvanted [facility]) injection    Sig: Inject 0.5 mLs into the muscle once for 1 dose.    Dispense:  0.5 mL    Refill:  0    Medications Discontinued During This Encounter  Medication Reason   benzonatate (TESSALON) 200 MG capsule    doxycycline (VIBRA-TABS) 100 MG tablet    sertraline (ZOLOFT) 50 MG tablet     Follow-up: No follow-ups on file.   [name], MD

## 2022-07-28 LAB — LIPID PANEL W/REFLEX DIRECT LDL
Cholesterol: 149 mg/dL (ref <200)
HDL: 62 mg/dL (ref >=50)
LDL Cholesterol (Calc): 71 mg/dL (calc)
Non-HDL Cholesterol (Calc): 87 mg/dL (calc) (ref <130)
Total CHOL/HDL Ratio: 2.4 (calc) (ref <5.0)
Triglycerides: 75 mg/dL (ref <150)

## 2022-09-08 ENCOUNTER — Telehealth: Payer: Self-pay

## 2022-09-08 DIAGNOSIS — E039 Hypothyroidism, unspecified: Secondary | ICD-10-CM

## 2022-09-08 NOTE — Telephone Encounter (Signed)
Patient states she was supposed to have her thyroid lab rechecked six weeks from the time she received her lab results.  Patient states her medication has been adjusted and that's why Dr. Deborra Medina wanted to check it again.  I let patient know that we do not currently have lab orders for this test, so I will send a message to Dr. Derrel Nip.

## 2022-09-08 NOTE — Telephone Encounter (Signed)
Lab ordered and pt is scheduled for a lab appt. Pt is aware of appt date and time.

## 2022-09-10 ENCOUNTER — Other Ambulatory Visit (INDEPENDENT_AMBULATORY_CARE_PROVIDER_SITE_OTHER): Payer: PPO

## 2022-09-10 DIAGNOSIS — E039 Hypothyroidism, unspecified: Secondary | ICD-10-CM

## 2022-09-10 DIAGNOSIS — E890 Postprocedural hypothyroidism: Secondary | ICD-10-CM

## 2022-09-10 NOTE — Addendum Note (Signed)
Addended by: Neta Ehlers on: 09/10/2022 02:09 PM   Modules accepted: Orders

## 2022-09-11 LAB — TSH: TSH: 0.347 u[IU]/mL — ABNORMAL LOW (ref 0.450–4.500)

## 2022-09-12 NOTE — Assessment & Plan Note (Signed)
Thyroid function is slightly overactive on current regimen of 50 mcg x 5,  75 mcg x 2.   Advised to reduce  75 mcg dose to a day per week incrase 50 mcg dose to 6 days per week   Lab Results  Component Value Date   TSH 0.347 (L) 09/10/2022   .

## 2022-10-06 ENCOUNTER — Encounter: Payer: Self-pay | Admitting: Internal Medicine

## 2022-10-06 ENCOUNTER — Ambulatory Visit (INDEPENDENT_AMBULATORY_CARE_PROVIDER_SITE_OTHER): Payer: PPO | Admitting: Internal Medicine

## 2022-10-06 ENCOUNTER — Ambulatory Visit: Payer: PPO

## 2022-10-06 VITALS — BP 128/76 | HR 61 | Temp 97.6°F | Ht 62.0 in | Wt 160.0 lb

## 2022-10-06 DIAGNOSIS — E0500 Thyrotoxicosis with diffuse goiter without thyrotoxic crisis or storm: Secondary | ICD-10-CM | POA: Diagnosis not present

## 2022-10-06 DIAGNOSIS — R944 Abnormal results of kidney function studies: Secondary | ICD-10-CM | POA: Diagnosis not present

## 2022-10-06 DIAGNOSIS — R2231 Localized swelling, mass and lump, right upper limb: Secondary | ICD-10-CM

## 2022-10-06 DIAGNOSIS — N6312 Unspecified lump in the right breast, upper inner quadrant: Secondary | ICD-10-CM

## 2022-10-06 DIAGNOSIS — Z23 Encounter for immunization: Secondary | ICD-10-CM | POA: Diagnosis not present

## 2022-10-06 MED ORDER — LEVOTHYROXINE SODIUM 50 MCG PO TABS: ORAL_TABLET | ORAL | 0 refills | Status: DC

## 2022-10-06 MED ORDER — RSVPREF3 VAC RECOMB ADJUVANTED 120 MCG/0.5ML IM SUSR: 0.5000 mL | Freq: Once | INTRAMUSCULAR | 0 refills | Status: AC

## 2022-10-06 NOTE — Patient Instructions (Addendum)
FOR THE THYROID:  CONTINUE TAKING   1 TABLET  50 MCG  DAILY X 6 DAYS,  THEN 1.5 TABLETS ON DAY 7  )USING THE 50 MCG TABLETS )   I HAVE ORDERED AN ULTRASOUND OF YOUR RIGHT ARM AND YOUR RIGHT BREAST ALONG WITH A DIAGNOSTIC MAMMOGRAM .  YOU WILL BE CALLED TO SCHEDULE THESE APPOINTMENTS  I do recommend the RSV vaccine for you, .  It is now available through [facility]

## 2022-10-06 NOTE — Progress Notes (Signed)
Subjective:  Patient ID: Katelyn Friedman, female    DOB: 05-26-1951  Age: 71 y.o. MRN: [id number]  CC: The primary encounter diagnosis was Mass of upper inner quadrant of right breast. Diagnoses of Mass of arm, right, Graves disease, Decreased GFR, and Need for immunization against influenza were also pertinent to this visit.   HPI Katelyn Friedman presents for evaluation of a knot on her forearm. Chief Complaint  Patient presents with   Acute Visit    Knot on arm right after having blood drawn   71 YR old female present with a soft nontender mass on the medial side of her her right antecubital fossa that has been present for a week or two.  She recalls having had blood drawn from the antecubital vein on Sept 15 , but did not have any bruising afterward  She has also noted a small hard lump on her right chest wall in the upper inner quadrant . Her last mammogram was normal in July and she has a metallic clip from prior biopsy of right breast that was benign in 2014.   Hypothyroidism:  her last TSH was suppressed in mid September,  and her dose was reduce to 50 mcg daily x 6,  75 mcg daily x 1    Outpatient Medications Prior to Visit  Medication Sig Dispense Refill   acetaminophen (TYLENOL) 500 MG tablet Take 500-1,000 mg by mouth every 6 (six) hours as needed (for pain/headaches.).     acyclovir (ZOVIRAX) 400 MG tablet Take 1 tablet by mouth once daily 90 tablet 0   aspirin 81 MG chewable tablet 1 tablet     atorvastatin (LIPITOR) 20 MG tablet Take 1 tablet (20 mg total) by mouth daily. 90 tablet 3   Cholecalciferol (VITAMIN D) 2000 units tablet Take 2,000 Units by mouth daily.     levothyroxine (SYNTHROID) 50 MCG tablet Take 1 tablet by mouth in the morning on an empty stomach on Monday thru Friday. (Patient taking differently: Take 50 mcg by mouth daily before breakfast. 6 days a week) 5 tablet 0   levothyroxine (SYNTHROID) 75 MCG tablet Take 1 tablet by mouth in the morning on an  empty stomach on Saturday and Sunday. (Patient taking differently: Take 75 mcg by mouth daily before breakfast. Take 1 tablet by mouth in the morning on an empty stomach one time a week) 90 tablet 1   No facility-administered medications prior to visit.    Review of Systems;  Patient denies headache, fevers, malaise, unintentional weight loss, skin rash, eye pain, sinus congestion and sinus pain, sore throat, dysphagia,  hemoptysis , cough, dyspnea, wheezing, chest pain, palpitations, orthopnea, edema, abdominal pain, nausea, melena, diarrhea, constipation, flank pain, dysuria, hematuria, urinary  Frequency, nocturia, numbness, tingling, seizures,  Focal weakness, Loss of consciousness,  Tremor, insomnia, depression, anxiety, and suicidal ideation.      Objective:  BP 128/76 (BP Location: Left Arm, Patient Position: Sitting, Cuff Size: Normal)   Pulse 61   Temp 97.6 F (36.4 C) (Oral)   Ht '5\' 2"'$  (1.575 m)   Wt 160 lb (72.6 kg)   SpO2 97%   BMI 29.26 kg/m   BP Readings from Last 3 Encounters:  10/06/22 128/76  07/27/22 118/80  04/30/22 130/70    Wt Readings from Last 3 Encounters:  10/06/22 160 lb (72.6 kg)  07/27/22 157 lb 3.2 oz (71.3 kg)  04/30/22 163 lb 9.6 oz (74.2 kg)    General appearance: alert, cooperative and  appears stated age Ears: normal TM's and external ear canals both ears Throat: lips, mucosa, and tongue normal; teeth and gums normal Neck: no adenopathy, no carotid bruit, supple, symmetrical, trachea midline and thyroid not enlarged, symmetric, no tenderness/mass/nodules Back: symmetric, no curvature. ROM normal. No CVA tenderness. Lungs: clear to auscultation bilaterally Heart: regular rate and rhythm, S1, S2 normal, no murmur, click, rub or gallop Breast:  right  breast with bb sized subcutaneous mass in upper inner quadrant, non tender Abdomen: soft, non-tender; bowel sounds normal; no masses,  no organomegaly Pulses: 2+ and symmetric Skin: Skin color,  texture, turgor normal. No rashes or lesions Lymph nodes: Cervical, supraclavicular, and axillary nodes normal. Neuro:  awake and interactive with normal mood and affect. Higher cortical functions are normal. Speech is clear without word-finding difficulty or dysarthria. Extraocular movements are intact. Visual fields of both eyes are grossly intact. Sensation to light touch is grossly intact bilaterally of upper and lower extremities. Motor examination shows 4+/5 symmetric hand grip and upper extremity and 5/5 lower extremity strength. There is no pronation or drift. Gait is non-ataxic   Lab Results  Component Value Date   HGBA1C 4.4 10/30/2021   HGBA1C 6.0 06/19/2020   HGBA1C 5.9 03/19/2020    Lab Results  Component Value Date   CREATININE 1.07 07/27/2022   CREATININE 0.95 11/05/2021   CREATININE 1.05 (H) 10/30/2021    Lab Results  Component Value Date   WBC 7.1 01/15/2021   HGB 13.1 01/15/2021   HCT 39.6 01/15/2021   PLT 267 01/15/2021   GLUCOSE 90 07/27/2022   CHOL 149 07/27/2022   TRIG 75 07/27/2022   HDL 62 07/27/2022   LDLDIRECT 151 (H) 08/25/2016   LDLCALC 71 07/27/2022   ALT 9 07/27/2022   AST 15 07/27/2022   NA 139 07/27/2022   K 4.3 07/27/2022   CL 105 07/27/2022   CREATININE 1.07 07/27/2022   BUN 16 07/27/2022   CO2 25 07/27/2022   TSH 0.347 (L) 09/10/2022   INR 1.0 05/09/2018   HGBA1C 4.4 10/30/2021    MM 3D SCREEN BREAST BILATERAL  Result Date: 07/14/2022 CLINICAL DATA:  Screening. EXAM: DIGITAL SCREENING BILATERAL MAMMOGRAM WITH TOMOSYNTHESIS AND CAD TECHNIQUE: Bilateral screening digital craniocaudal and mediolateral oblique mammograms were obtained. Bilateral screening digital breast tomosynthesis was performed. The images were evaluated with computer-aided detection. COMPARISON:  Previous exam(s). ACR Breast Density Category b: There are scattered areas of fibroglandular density. FINDINGS: There are no findings suspicious for malignancy. IMPRESSION: No  mammographic evidence of malignancy. A result letter of this screening mammogram will be mailed directly to the patient. RECOMMENDATION: Screening mammogram in one year. (Code:SM-B-01Y) BI-RADS CATEGORY  1: Negative. Electronically Signed   By: Dorise Bullion III M.D.   On: 07/14/2022 20:38    Assessment & Plan:   Problem List Items Addressed This Visit     Graves disease    Managed with levothyroxine .  Recent TSH is suppressed,  Reduced current regimen to  50 mcg x 6,  75 mcg x 1   Lab Results  Component Value Date   TSH 0.347 (L) 09/10/2022         Relevant Medications   levothyroxine (SYNTHROID) 50 MCG tablet   Other Relevant Orders   TSH   Mass of arm, right    Exam c/w lipoma but given its occurrence after a blood draw will check ultrasound       Relevant Orders   [place] RT UPPER EXTREM LTD SOFT  TISSUE NON VASCULAR   Mass of upper inner quadrant of right breast - Primary    Diagnostic mammogram ordered ; not clear if mass is from previous clip placed during 2014 biopsy      Relevant Orders   MM DIAG BREAST TOMO BILATERAL   US BREAST LTD UNI RIGHT INC AXILLA   Other Visit Diagnoses     Decreased GFR       Relevant Orders   Basic metabolic panel   Need for immunization against influenza       Relevant Orders   Flu Vaccine QUAD High Dose(Fluad) (Completed)       I spent 30 mintutes dedicated to the care of this patient on the date of this encounter to include pre-visit review of hers medical history,  Face-to-face time with the patient , review of prior biopsies and imaging studies, and post visit ordering of testing and therapeutics.  Follow-up: No follow-ups on file.   [name], MD

## 2022-10-07 DIAGNOSIS — R2231 Localized swelling, mass and lump, right upper limb: Secondary | ICD-10-CM | POA: Insufficient documentation

## 2022-10-07 DIAGNOSIS — N6312 Unspecified lump in the right breast, upper inner quadrant: Secondary | ICD-10-CM | POA: Insufficient documentation

## 2022-10-07 NOTE — Assessment & Plan Note (Signed)
Managed with levothyroxine .  Recent TSH is suppressed,  Reduced current regimen to  50 mcg x 6,  75 mcg x 1   Lab Results  Component Value Date   TSH 0.347 (L) 09/10/2022

## 2022-10-07 NOTE — Assessment & Plan Note (Signed)
Exam c/w lipoma but given its occurrence after a blood draw will check ultrasound

## 2022-10-07 NOTE — Assessment & Plan Note (Signed)
Diagnostic mammogram ordered ; not clear if mass is from previous clip placed during 2014 biopsy

## 2022-10-11 DIAGNOSIS — M1712 Unilateral primary osteoarthritis, left knee: Secondary | ICD-10-CM | POA: Diagnosis not present

## 2022-10-13 DIAGNOSIS — H2513 Age-related nuclear cataract, bilateral: Secondary | ICD-10-CM | POA: Diagnosis not present

## 2022-10-14 ENCOUNTER — Telehealth: Payer: Self-pay | Admitting: Internal Medicine

## 2022-10-14 NOTE — Telephone Encounter (Signed)
Copied from [place] [id number]. Topic: Medicare AWV >> Oct 14, 2022  2:06 PM Devoria Glassing wrote: Reason for CRM: Left message for patient to schedule Annual Wellness Visit.  Please schedule with Nurse Health Advisor Denisa O'Brien-Blaney, LPN at [facility]. This appt can be telephone or office visit.  Please call [phone] ask for [facility]

## 2022-10-17 ENCOUNTER — Other Ambulatory Visit: Payer: Self-pay | Admitting: Internal Medicine

## 2022-10-17 DIAGNOSIS — N6312 Unspecified lump in the right breast, upper inner quadrant: Secondary | ICD-10-CM

## 2022-10-20 ENCOUNTER — Other Ambulatory Visit: Payer: Self-pay | Admitting: Internal Medicine

## 2022-10-20 DIAGNOSIS — N6312 Unspecified lump in the right breast, upper inner quadrant: Secondary | ICD-10-CM

## 2022-10-27 ENCOUNTER — Telehealth: Payer: Self-pay | Admitting: Internal Medicine

## 2022-10-27 NOTE — Telephone Encounter (Signed)
Name: Katelyn Friedman  Phone# [phone] Provider Dr Tobie Lords patient to schedule her AWV and she was asking when will she get the call for an Ultrasound.  She saw provider on 10/06/22.  Please advise.

## 2022-10-28 ENCOUNTER — Telehealth: Payer: Self-pay

## 2022-10-28 ENCOUNTER — Ambulatory Visit: Payer: PPO

## 2022-10-28 NOTE — Telephone Encounter (Signed)
Dr. Derrel Nip said she does not see any orders any where that she needs to sign.

## 2022-10-28 NOTE — Telephone Encounter (Signed)
No answer when called for scheduled AWV. Unable to leave message. No voicemail.

## 2022-10-28 NOTE — Telephone Encounter (Signed)
The orders need to be e signed in epic before they can schedule the appt.

## 2022-10-28 NOTE — Telephone Encounter (Signed)
Pt called back to schedule her [place]. Is there a number you would like for me to give her to call to get scheduled.

## 2022-10-30 IMAGING — US US BREAST*L* LIMITED INC AXILLA
1 series · 4 of 4 positions shown · non-contrast
Comparison: Multiple prior studies including 07/07/2011 and
07/09/2011

CLINICAL DATA: Patient has multiple palpable abnormalities in the
RIGHT breast. Palpable abnormality in the UPPER INNER QUADRANT of
the LEFT breast. Patient denies prior trauma to either breast.

EXAM:
DIGITAL DIAGNOSTIC BILATERAL MAMMOGRAM WITH CAD AND TOMOSYNTHESIS
ULTRASOUND BILATERAL BREAST
TECHNIQUE: Bilateral digital diagnostic mammography and breast tomosynthesis
was performed. Digital images of the breasts were evaluated with
computer-aided detection. Targeted ultrasound examination of the
Bilateral breast was performed.

[Series 1: us breast*left* limited inc axilla · 0.07mm/px · 4 of 4 slices shown]
[im 1/4]
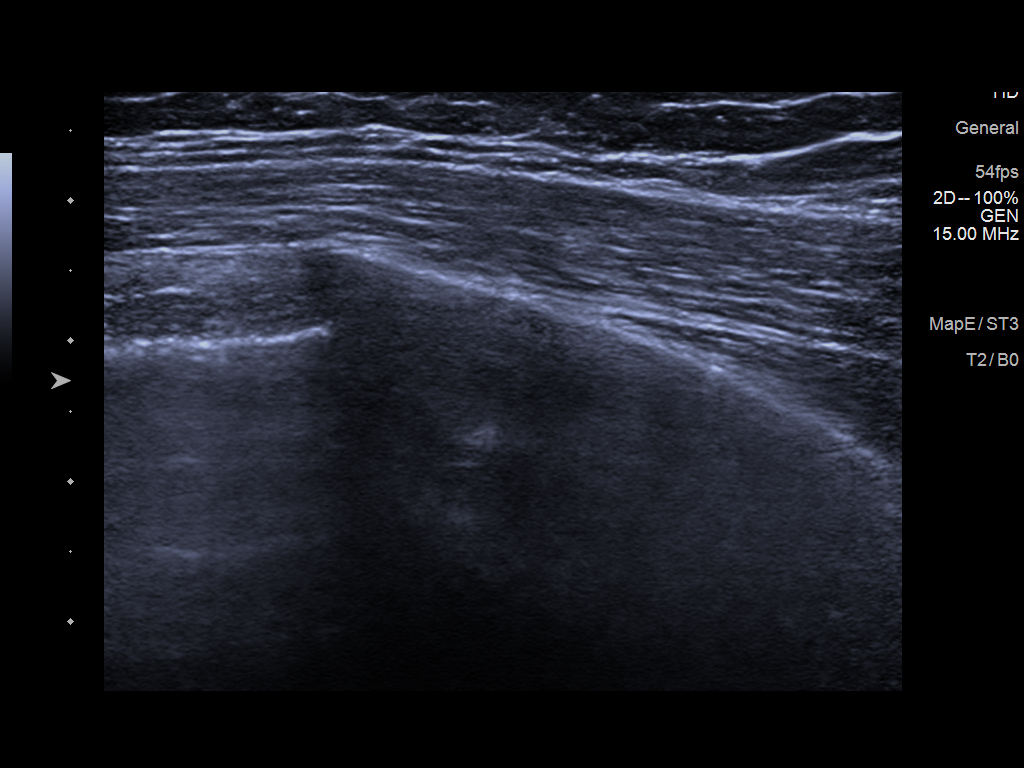
[im 2/4]
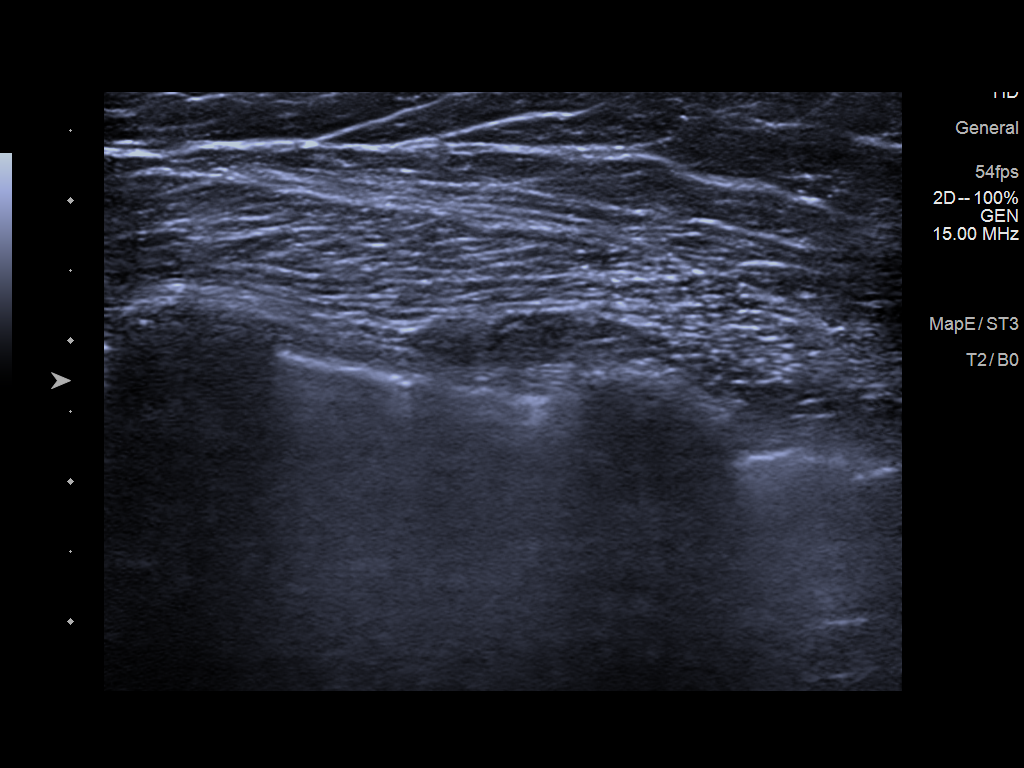
[im 3/4]
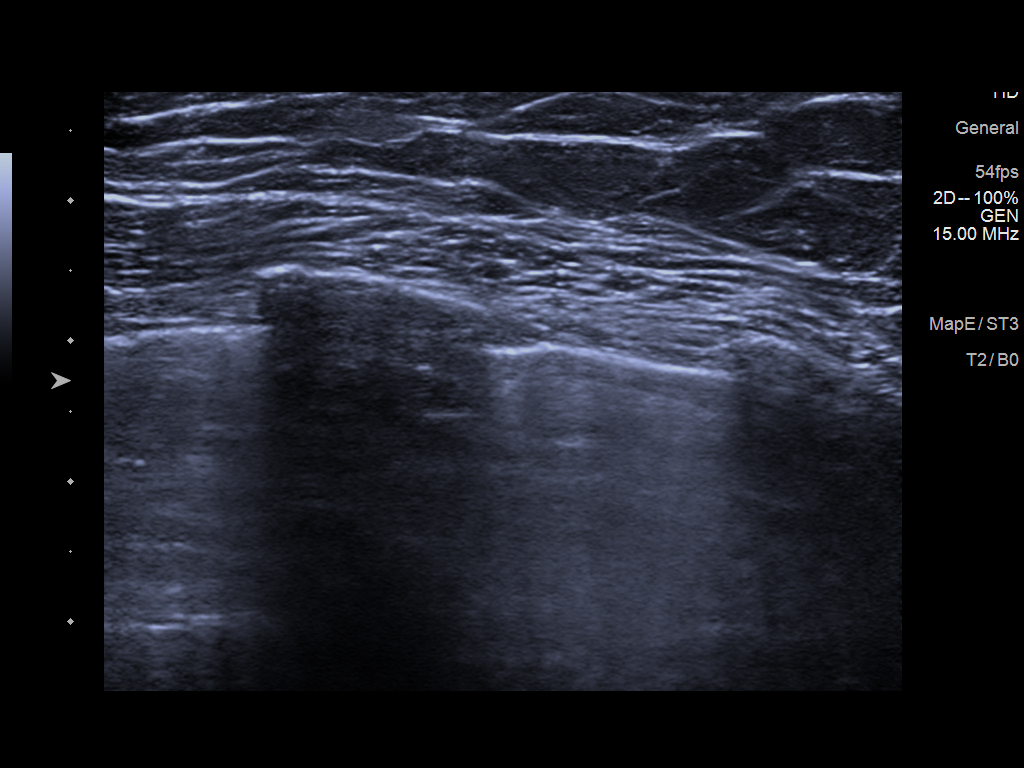
[im 4/4]
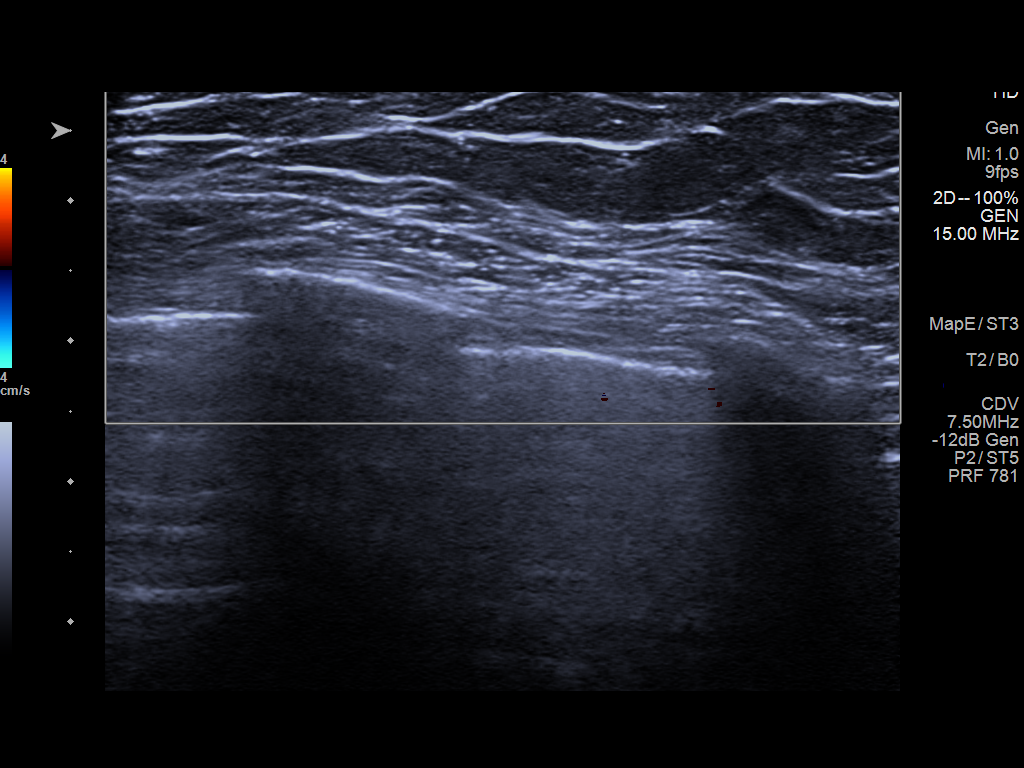

[4 of 4 positions shown; findings below may reference images not displayed]

ACR Breast Density Category b: There are scattered areas of
fibroglandular density.
FINDINGS: RIGHT BREAST:

Mammogram: Initial spot tangential views of the areas of concern in
the RIGHT breast show normal appearing fibrofatty tissue without
mass. There are scattered oil cysts within the MEDIAL aspect of the
RIGHT breast. There is long-term stability of benign focal asymmetry
in the MEDIAL aspect of the RIGHT breast. After ultrasound, a spot
tangential view is performed of the mass in the UPPER INNER QUADRANT
of the RIGHT breast. This view shows a densely calcified oil cyst in
the UPPER INNER QUADRANT corresponding to the ultrasound abnormality
mammographic images were processed with CAD.

Physical Exam: I palpate a discrete mobile superficial mass in the 1
o'clock location of the RIGHT breast 10 centimeters from the nipple.

Ultrasound: Targeted ultrasound is performed, showing densely
calcified mass associated with acoustic shadowing in the 1 o'clock
location of the RIGHT breast 10 centimeters from the nipple
measuring 1.3 x 0.5 x 1.3 centimeters. Spot tangential view with BB
over this mass confirms a densely calcified oil cyst.

LEFT BREAST:

Mammogram: No suspicious mass, distortion, or microcalcifications
are identified to suggest presence of malignancy. Mammographic
images were processed with CAD.

Ultrasound: Targeted ultrasound is performed, showing normal
appearing fibroglandular tissue in the UPPER INNER QUADRANT of the
LEFT breast, in the area of patient's concern.
IMPRESSION: 1. Benign oil cyst in the 1 o'clock location of the RIGHT breast.
2.  No mammographic or ultrasound evidence for malignancy.

RECOMMENDATION:
Screening mammogram in one year.(Code:G9-Q-J5P)

I have discussed the findings and recommendations with the patient.
If applicable, a reminder letter will be sent to the patient
regarding the next appointment.

BI-RADS CATEGORY  2: Benign.

## 2022-10-30 IMAGING — MG DIGITAL DIAGNOSTIC BILAT W/ TOMO W/ CAD
8 of 16 series · 8 of 40 positions shown · non-contrast
Comparison: Multiple prior studies including 07/07/2011 and
07/09/2011

CLINICAL DATA: Patient has multiple palpable abnormalities in the
RIGHT breast. Palpable abnormality in the UPPER INNER QUADRANT of
the LEFT breast. Patient denies prior trauma to either breast.

EXAM:
DIGITAL DIAGNOSTIC BILATERAL MAMMOGRAM WITH CAD AND TOMOSYNTHESIS
ULTRASOUND BILATERAL BREAST
TECHNIQUE: Bilateral digital diagnostic mammography and breast tomosynthesis
was performed. Digital images of the breasts were evaluated with
computer-aided detection. Targeted ultrasound examination of the
Bilateral breast was performed.

[R CC synth-2D (1 of 2)]
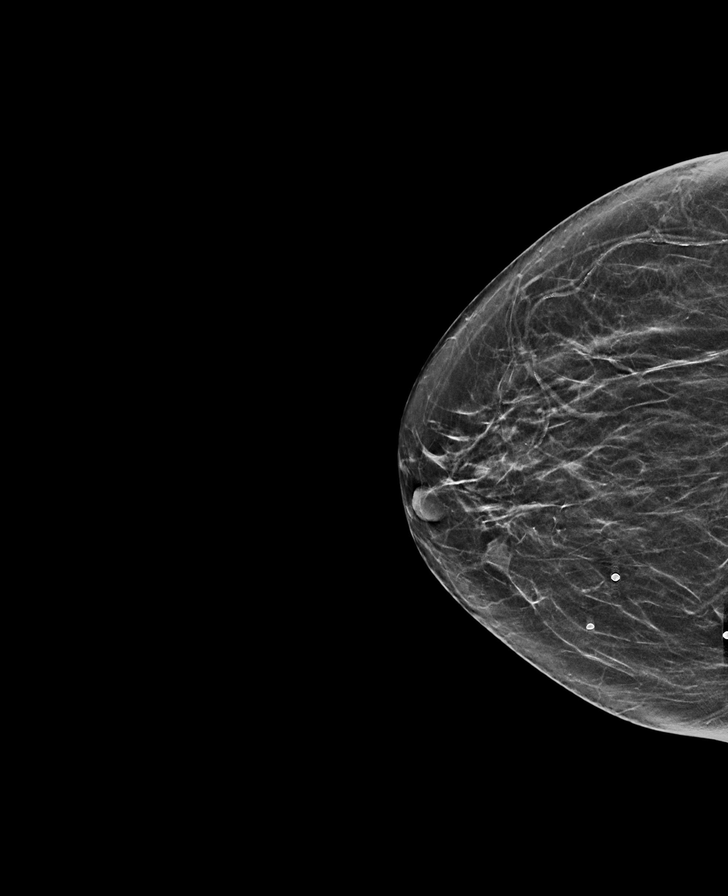

[R MLO synth-2D]
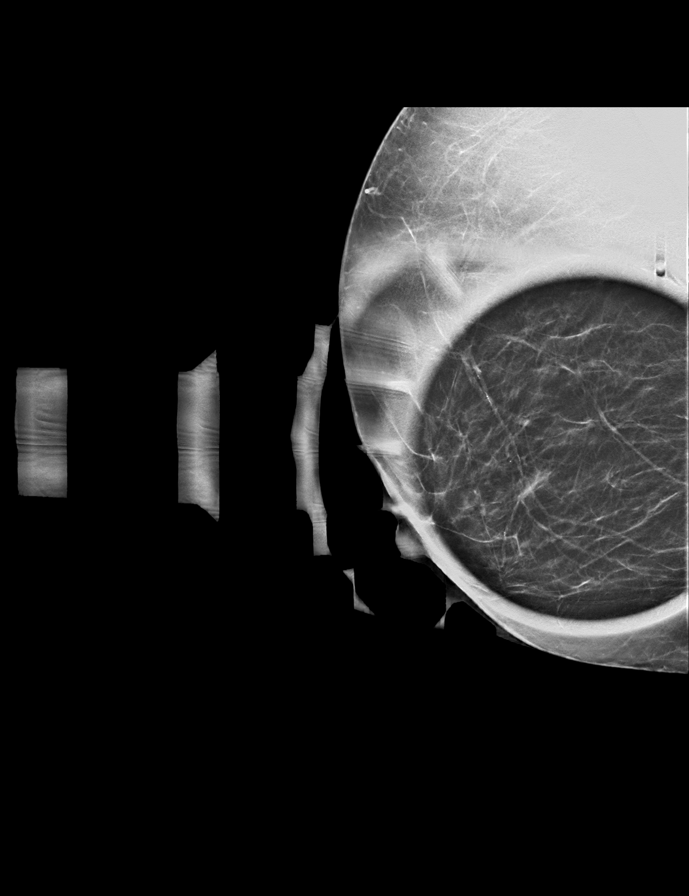

[R TAN synth-2D]
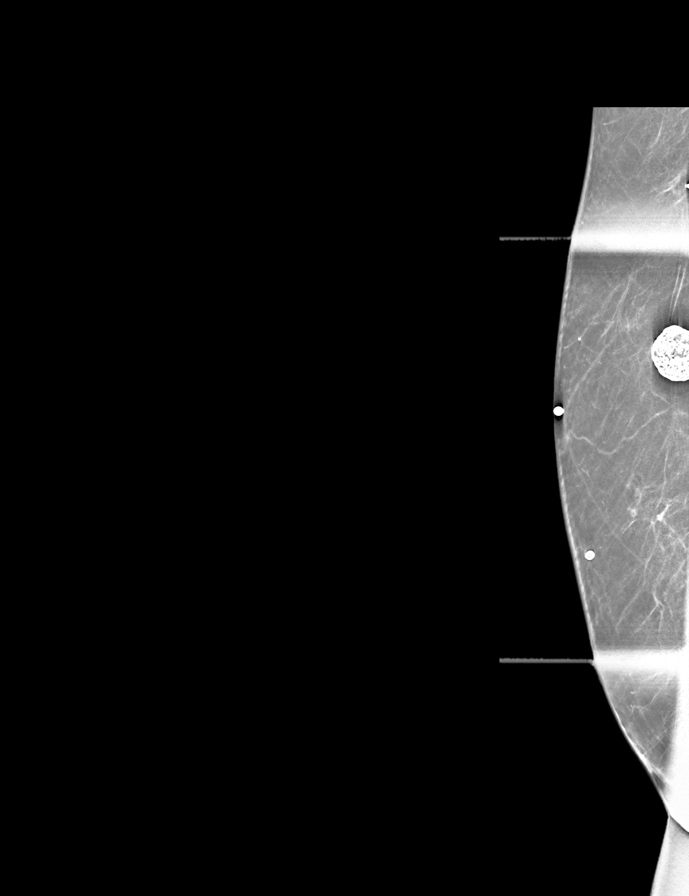

[R CC synth-2D (2 of 2)]
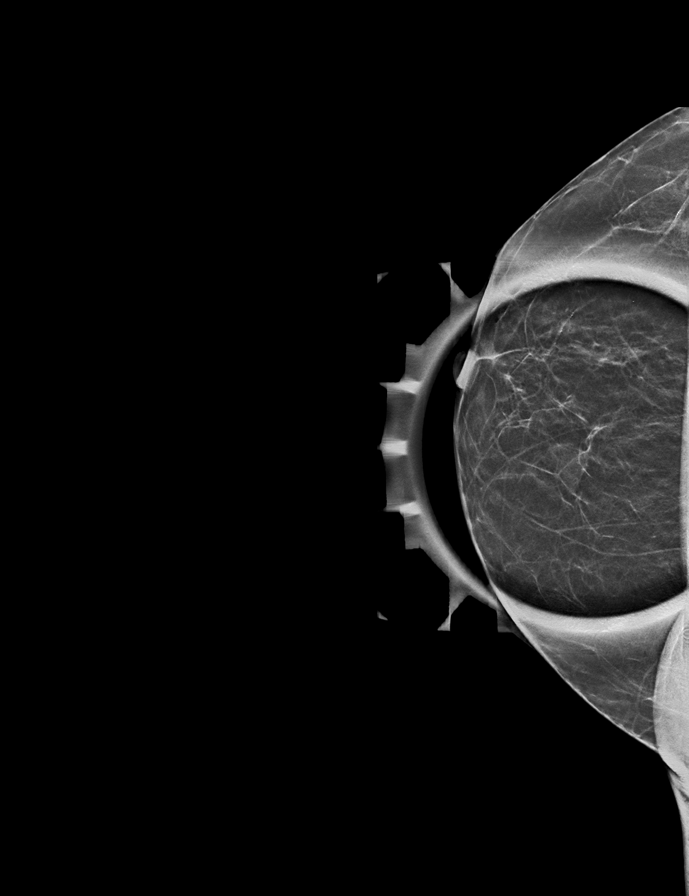

[R ML synth-2D]
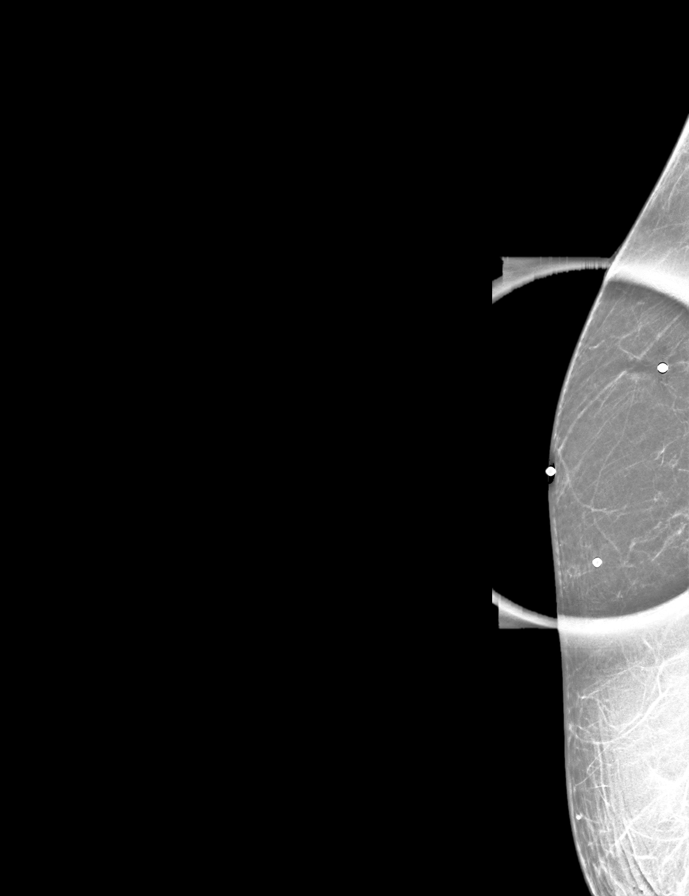

[L CC synth-2D]
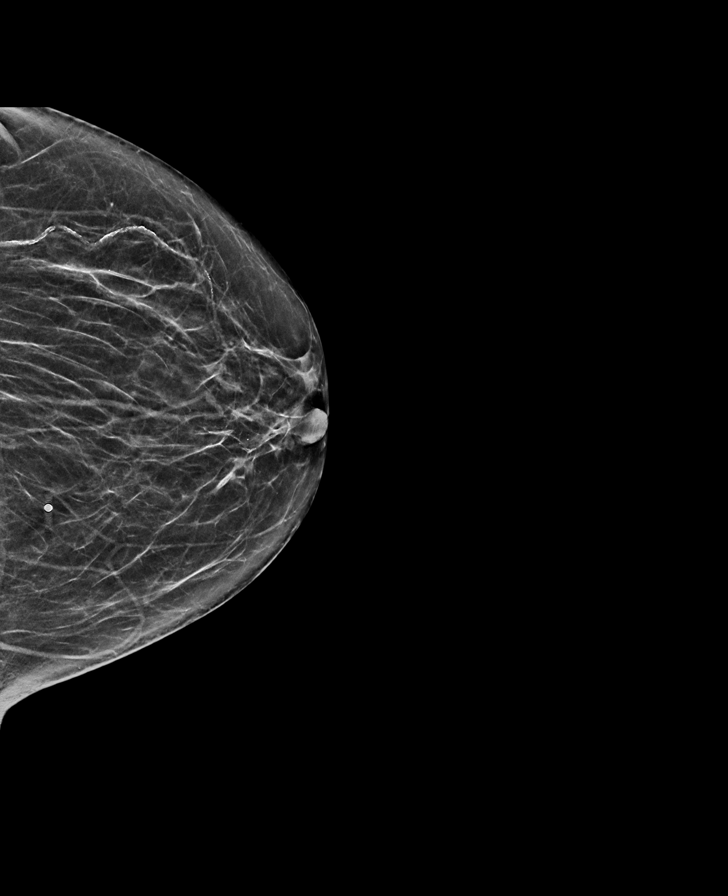

[L MLO synth-2D]
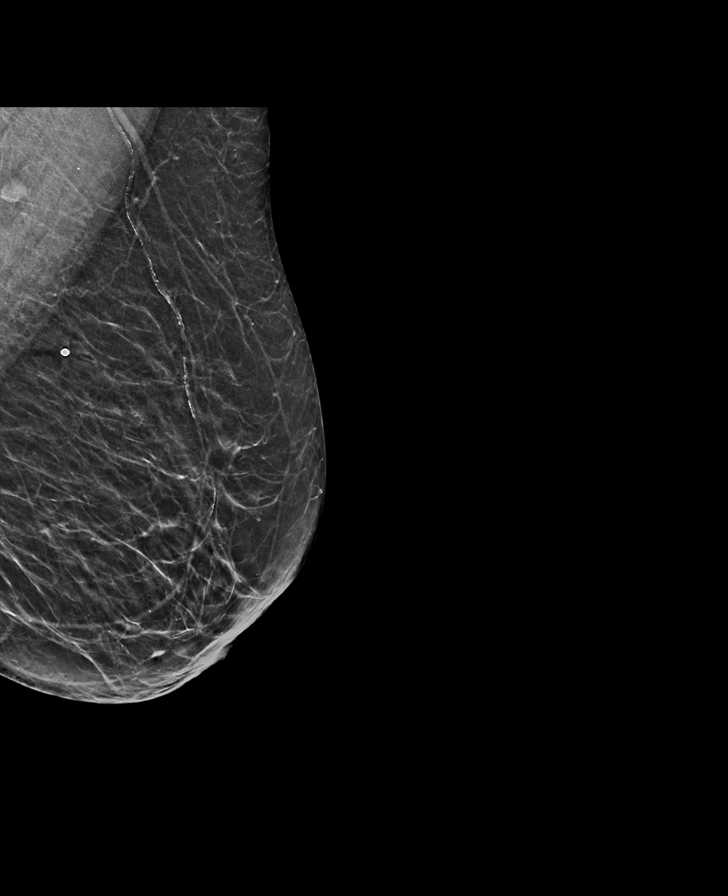

[L MLO tomo · tomo slice 25/50.0]
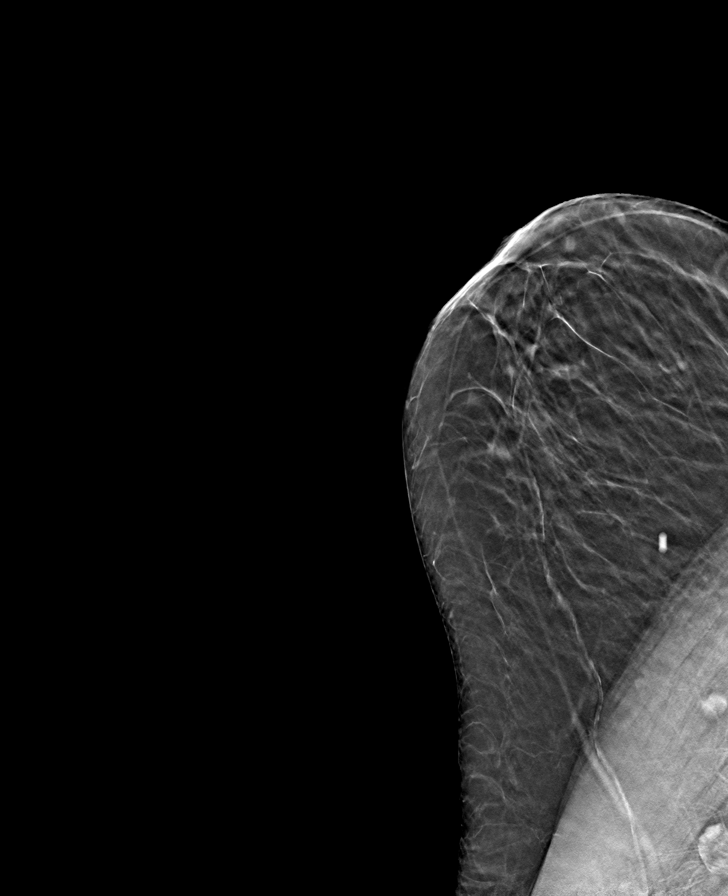

[8 of 40 positions shown; findings below may reference images not displayed]

ACR Breast Density Category b: There are scattered areas of
fibroglandular density.
FINDINGS: RIGHT BREAST:

Mammogram: Initial spot tangential views of the areas of concern in
the RIGHT breast show normal appearing fibrofatty tissue without
mass. There are scattered oil cysts within the MEDIAL aspect of the
RIGHT breast. There is long-term stability of benign focal asymmetry
in the MEDIAL aspect of the RIGHT breast. After ultrasound, a spot
tangential view is performed of the mass in the UPPER INNER QUADRANT
of the RIGHT breast. This view shows a densely calcified oil cyst in
the UPPER INNER QUADRANT corresponding to the ultrasound abnormality
mammographic images were processed with CAD.

Physical Exam: I palpate a discrete mobile superficial mass in the 1
o'clock location of the RIGHT breast 10 centimeters from the nipple.

Ultrasound: Targeted ultrasound is performed, showing densely
calcified mass associated with acoustic shadowing in the 1 o'clock
location of the RIGHT breast 10 centimeters from the nipple
measuring 1.3 x 0.5 x 1.3 centimeters. Spot tangential view with BB
over this mass confirms a densely calcified oil cyst.

LEFT BREAST:

Mammogram: No suspicious mass, distortion, or microcalcifications
are identified to suggest presence of malignancy. Mammographic
images were processed with CAD.

Ultrasound: Targeted ultrasound is performed, showing normal
appearing fibroglandular tissue in the UPPER INNER QUADRANT of the
LEFT breast, in the area of patient's concern.
IMPRESSION: 1. Benign oil cyst in the 1 o'clock location of the RIGHT breast.
2.  No mammographic or ultrasound evidence for malignancy.

RECOMMENDATION:
Screening mammogram in one year.(Code:[id number])

I have discussed the findings and recommendations with the patient.
If applicable, a reminder letter will be sent to the patient
regarding the next appointment.

BI-RADS CATEGORY  2: Benign.

## 2022-11-04 ENCOUNTER — Ambulatory Visit
Admission: RE | Admit: 2022-11-04 | Discharge: 2022-11-04 | Disposition: A | Discharge status: Home / Self Care | Payer: PPO | Source: Ambulatory Visit | Attending: Internal Medicine | Admitting: Internal Medicine

## 2022-11-04 DIAGNOSIS — R2231 Localized swelling, mass and lump, right upper limb: Secondary | ICD-10-CM | POA: Insufficient documentation

## 2022-11-04 DIAGNOSIS — Z0389 Encounter for observation for other suspected diseases and conditions ruled out: Secondary | ICD-10-CM | POA: Diagnosis not present

## 2022-11-12 ENCOUNTER — Ambulatory Visit: Payer: PPO

## 2022-11-12 ENCOUNTER — Telehealth: Payer: Self-pay

## 2022-11-12 NOTE — Telephone Encounter (Signed)
Unable to reach patient for scheduled AWV. No answer. Left message. Okay to reschedule.

## 2022-11-17 ENCOUNTER — Other Ambulatory Visit: Payer: PPO

## 2022-11-25 ENCOUNTER — Other Ambulatory Visit: Payer: PPO

## 2022-11-26 ENCOUNTER — Telehealth: Payer: Self-pay

## 2022-11-26 NOTE — Telephone Encounter (Signed)
Unable to reach patient for scheduled AWV. Left message okay to reschedule.

## 2022-12-01 ENCOUNTER — Telehealth: Payer: Self-pay

## 2022-12-01 ENCOUNTER — Ambulatory Visit (INDEPENDENT_AMBULATORY_CARE_PROVIDER_SITE_OTHER): Payer: PPO

## 2022-12-01 VITALS — Ht 62.0 in | Wt 160.0 lb

## 2022-12-01 DIAGNOSIS — Z0000 Encounter for general adult medical examination without abnormal findings: Secondary | ICD-10-CM

## 2022-12-01 NOTE — Telephone Encounter (Signed)
Error in opening

## 2022-12-01 NOTE — Progress Notes (Addendum)
Subjective:   Katelyn Friedman is a 71 y.o. female who presents for Medicare Annual (Subsequent) preventive examination.  Review of Systems    No ROS.  Medicare Wellness Virtual Visit.  Visual/audio telehealth visit, UTA vital signs.   See social history for additional risk factors.   Cardiac Risk Factors include: advanced age (>59mn, >>39women)     Objective:    Today's Vitals   12/01/22 1537  Weight: 160 lb (72.6 kg)  Height: '5\' 2"'$  (1.575 m)   Body mass index is 29.26 kg/m.     12/01/2022    3:43 PM 12/24/2019   11:58 AM 12/18/2018   10:13 AM 10/26/2017    8:21 AM 09/21/2017   11:15 AM 09/13/2017    9:00 AM 07/18/2017    8:56 PM  Advanced Directives  Does Patient Have a Medical Advance Directive? No No Yes No No No No  Type of Advance Directive   [place]     Does patient want to make changes to medical advance directive?   No - Patient declined      Copy of [place]?   No - copy requested      Would patient like information on creating a medical advance directive? No - Patient declined No - Patient declined  No - Patient declined No - Patient declined No - Patient declined Yes (Inpatient - patient requests chaplain consult to create a medical advance directive)    Current Medications (verified) Outpatient Encounter Medications as of 12/01/2022  Medication Sig   acetaminophen (TYLENOL) 500 MG tablet Take 500-1,000 mg by mouth every 6 (six) hours as needed (for pain/headaches.).   acyclovir (ZOVIRAX) 400 MG tablet Take 1 tablet by mouth once daily   aspirin 81 MG chewable tablet 1 tablet   atorvastatin (LIPITOR) 20 MG tablet Take 1 tablet (20 mg total) by mouth daily.   Cholecalciferol (VITAMIN D) 2000 units tablet Take 2,000 Units by mouth daily.   levothyroxine (SYNTHROID) 50 MCG tablet 1 TABLET DAILY X 6 DAYS,  1.5 TABLETS ON DAY 7   REPEAT WEEKLY   No facility-administered encounter medications on file as of  12/01/2022.    Allergies (verified) Influenza vaccines, Amoxicillin, Codeine, and Mobic [meloxicam]   History: Past Medical History:  Diagnosis Date   Allergic rhinitis    Arthritis    Family history of adverse reaction to anesthesia    PONV   GERD (gastroesophageal reflux disease)    Herpes simplex    History of colon polyps    IBS (irritable bowel syndrome)    PONV (postoperative nausea and vomiting)    Thyroid disease    graves disease   Past Surgical History:  Procedure Laterality Date   BREAST BIOPSY Right 2014   negative   CHOLECYSTECTOMY N/A 09/21/2017   Procedure: LAPAROSCOPIC CHOLECYSTECTOMY WITH INTRAOPERATIVE CHOLANGIOGRAM;  Surgeon: BRobert Bellow MD;  Location: [facility] OR[facility];  Service: General;  Laterality: N/A;   COLONOSCOPY  05/07/2014   5 mm hyperplastic polyp removed from the rectosigmoid.  Maternal aunt with history colon cancer.  10-year follow-up planned.   KNEE ARTHROSCOPY  1992   KNEE ARTHROSCOPY Right 06/08/2016   Procedure: ARTHROSCOPY KNEE- partial medial minisectomy and condroplasty;  Surgeon: PMelrose Nakayama MD;  [facility]  Service: Orthopedics;  Laterality: Right;   ROTATOR CUFF REPAIR  2006   CMargaretmary Eddy   TONSILLECTOMY     TUBAL LIGATION  Family History  Problem Relation Age of Onset   Stroke Mother    Alzheimer's disease Mother    Hypertension Mother    Colon polyps Maternal Aunt    Cancer Maternal Aunt        colon   Breast cancer Maternal Grandmother    Breast cancer Other        m great GM   Social History   Socioeconomic History   Marital status: Married    Spouse name: Not on file   Number of children: Not on file   Years of education: Not on file   Highest education level: Not on file  Occupational History   Not on file  Tobacco Use   Smoking status: Never   Smokeless tobacco: Never  Vaping Use   Vaping Use: Never used  Substance and Sexual Activity   Alcohol use: No    Alcohol/week: 0.0 standard drinks of  alcohol   Drug use: No   Sexual activity: Yes    Partners: Male  Other Topics Concern   Not on file  Social History Narrative   Married: 19 yrs   Regular exercise: walk   Caffeine use: none   Social Determinants of Health   Financial Resource Strain: Low Risk  (12/01/2022)   Overall Financial Resource Strain (CARDIA)    Difficulty of Paying Living Expenses: Not hard at all  Food Insecurity: No Food Insecurity (12/01/2022)   Hunger Vital Sign    Worried About Running Out of Food in the Last Year: Never true    Ran Out of Food in the Last Year: Never true  Transportation Needs: No Transportation Needs (12/01/2022)   PRAPARE - Hydrologist (Medical): No    Lack of Transportation (Non-Medical): No  Physical Activity: Insufficiently Active (12/25/2020)   Exercise Vital Sign    Days of Exercise per Week: 7 days    Minutes of Exercise per Session: 10 min  Stress: No Stress Concern Present (12/01/2022)   [place]    Feeling of Stress : Not at all  Social Connections: Unknown (12/01/2022)   Social Connection and Isolation Panel [NHANES]    Frequency of Communication with Friends and Family: Three times a week    Frequency of Social Gatherings with Friends and Family: More than three times a week    Attends Religious Services: Not on Advertising copywriter or Organizations: Not on file    Attends Archivist Meetings: Not on file    Marital Status: Married    Tobacco Counseling Counseling given: Not Answered   Clinical Intake:  Pre-visit preparation completed: Yes        Diabetes: No  How often do you need to have someone help you when you read instructions, pamphlets, or other written materials from your doctor or pharmacy?: 1 - Never    Interpreter Needed?: No      Activities of Daily Living    12/01/2022    3:46 PM  In your present state of health, do  you have any difficulty performing the following activities:  Hearing? 0  Vision? 0  Difficulty concentrating or making decisions? 0  Walking or climbing stairs? 0  Dressing or bathing? 0  Doing errands, shopping? 0  Preparing Food and eating ? N  Using the Toilet? N  In the past six months, have you accidently leaked urine? N  Do you have  problems with loss of bowel control? N  Managing your Medications? N  Managing your Finances? N  Housekeeping or managing your Housekeeping? N    Patient Care Team: [name], MD as PCP - General (Internal Medicine) [name], MD (Internal Medicine) Bary Castilla Forest Gleason, MD (General Surgery)  Indicate any recent Medical Services you may have received from other than [facility] providers in the past year (date may be approximate).     Assessment:   This is a routine wellness examination for Be[place].  I connected with  [name] on 12/01/22 by a audio enabled telemedicine application and verified that I am speaking with the correct person using two identifiers.  Patient Location: Home  Provider Location: Office/Clinic  I discussed the limitations of evaluation and management by telemedicine. The patient expressed understanding and agreed to proceed.   Hearing/Vision screen Hearing Screening - Comments:: Patient is able to hear conversational tones without difficulty. No issues reported. Vision Screening - Comments:: Followed by [facility] Wears corrective lenses    Dietary issues and exercise activities discussed: Current Exercise Habits: Home exercise routine, Intensity: Mild   Goals Addressed               This Visit's Progress     Patient Stated      Maintain Healthy Lifestyle (pt-stated)        Stay active. Healthy diet.       Depression Screen    12/01/2022    3:45 PM 07/27/2022   10:10 AM 12/25/2021    4:39 PM 04/14/2021    4:04 PM 01/15/2021    3:48 PM 12/25/2020    1:15 PM 12/24/2019   11:59 AM   PHQ 2/9 Scores  PHQ - 2 Score 0 0 6 2 0 0 0  PHQ- 9 Score   15 4       Fall Risk    12/01/2022    3:44 PM 10/06/2022    4:20 PM 07/27/2022   10:10 AM 12/25/2021    4:38 PM 04/14/2021    3:13 PM  [place] in the past year? 1 0 0 0 0  Number falls in past yr: 1      Injury with Fall? 1      Risk for fall due to :  No Fall Risks No Fall Risks No Fall Risks   Follow up Falls evaluation completed;Falls prevention discussed Falls evaluation completed Falls evaluation completed Falls evaluation completed Falls evaluation completed    FALL RISK PREVENTION PERTAINING TO THE HOME: Home free of loose throw rugs in walkways, pet beds, electrical cords, etc? Yes  Adequate lighting in your home to reduce risk of falls? Yes   ASSISTIVE DEVICES UTILIZED TO PREVENT FALLS: Life alert? No  Use of a cane, walker or w/c? No  Grab bars in the bathroom? No  Shower chair or bench in shower? No  Elevated toilet seat or a handicapped toilet? No   TIMED UP AND GO: Was the test performed? No .   Cognitive Function:    12/18/2018   10:18 AM 10/26/2017    8:33 AM  MMSE - Mini Mental State Exam  Orientation to time 5 5  Orientation to Place 5 5  Registration 3 3  Attention/ Calculation 5 5  Recall 3 3  Language- name 2 objects 2 2  Language- repeat 1 1  Language- follow 3 step command 3 3  Language- read & follow direction 1 1  Write a sentence 1 1  Copy design 1 1  Total score 30 30        12/01/2022    3:48 PM 12/25/2020    1:17 PM 12/24/2019   11:52 AM  6CIT Screen  What Year? 0 points 0 points 0 points  What month? 0 points 0 points 0 points  What time? 0 points 0 points 0 points  Count back from 20 0 points 0 points 0 points  Months in reverse 0 points 0 points 0 points  Repeat phrase 0 points 0 points 0 points  Total Score 0 points 0 points 0 points    Immunizations Immunization History  Administered Date(s) Administered   Fluad Quad(high Dose 65+) 08/23/2019,  09/15/2020, 10/30/2021, 10/06/2022   Influenza, High Dose Seasonal PF 09/26/2018   PFIZER(Purple Top)SARS-COV-2 Vaccination 01/19/2020, 01/30/2020, 08/27/2020, 08/07/2021   Pfizer Covid-19 Vaccine Bivalent Booster 78yr & up 11/06/2021   Pneumococcal Conjugate-13 11/09/2017   Pneumococcal Polysaccharide-23 03/02/2013, 01/30/2019   Tdap 04/10/2014   Shingrix vaccine- agrees to update immunization record upon completion.   Screening Tests Health Maintenance  Topic Date Due   COVID-19 Vaccine (6 - 2023-24 season) 12/17/2022 (Originally 08/27/2022)   Zoster Vaccines- Shingrix (1 of 2) 03/02/2023 (Originally 06/28/1970)   MAMMOGRAM  07/14/2023   Medicare Annual Wellness (AWV)  12/02/2023   DTaP/Tdap/Td (2 - Td or Tdap) 04/10/2024   COLONOSCOPY (Pts 45-473yrInsurance coverage will need to be confirmed)  05/07/2024   Pneumonia Vaccine 6561Years old  Completed   INFLUENZA VACCINE  Completed   DEXA SCAN  Completed   Hepatitis C Screening  Completed   HPV VACCINES  Aged Out   Health Maintenance There are no preventive care reminders to display for this patient.  Mammogram- scheduled 11/2022.  Lung Cancer Screening: (Low Dose CT Chest recommended if Age 72-80ears, 30 pack-year currently smoking OR have quit w/in 15years.) does not qualify.   Hepatitis C Screening: Completed 2018.  Vision Screening: Recommended annual ophthalmology exams for early detection of glaucoma and other disorders of the eye.  Dental Screening: Recommended annual dental exams for proper oral hygiene.  Community Resource Referral / Chronic Care Management: CRR required this visit?  No   CCM required this visit?  No      Plan:     I have personally reviewed and noted the following in the patient's chart:   Medical and social history Use of alcohol, tobacco or illicit drugs  Current medications and supplements including opioid prescriptions. Patient is not currently taking opioid prescriptions. Functional  ability and status Nutritional status Physical activity Advanced directives List of other physicians Hospitalizations, surgeries, and ER visits in previous 12 months Vitals Screenings to include cognitive, depression, and falls Referrals and appointments  In addition, I have reviewed and discussed with patient certain preventive protocols, quality metrics, and best practice recommendations. A written personalized care plan for preventive services as well as general preventive health recommendations were provided to patient.     DeWest MiddletownLPN   [id number]    I have reviewed the above information and agree with above.   TeDeborra MedinaMD

## 2022-12-01 NOTE — Patient Instructions (Addendum)
Ms. Katelyn Friedman , Thank you for taking time to come for your Medicare Wellness Visit. I appreciate your ongoing commitment to your health goals. Please review the following plan we discussed and let me know if I can assist you in the future.   These are the goals we discussed:  Goals       Patient Stated      Maintain Healthy Lifestyle (pt-stated)      Stay active. Healthy diet.        This is a list of the screening recommended for you and due dates:  Health Maintenance  Topic Date Due   COVID-19 Vaccine (6 - 2023-24 season) 12/17/2022*   Zoster (Shingles) Vaccine (1 of 2) 03/02/2023*   Mammogram  07/14/2023   Medicare Annual Wellness Visit  12/02/2023   DTaP/Tdap/Td vaccine (2 - Td or Tdap) 04/10/2024   Colon Cancer Screening  05/07/2024   Pneumonia Vaccine  Completed   Flu Shot  Completed   DEXA scan (bone density measurement)  Completed   Hepatitis C Screening: USPSTF Recommendation to screen - Ages 55-79 yo.  Completed   HPV Vaccine  Aged Out  *Topic was postponed. The date shown is not the original due date.   Conditions/risks identified: none new  Next appointment: Follow up in one year for your annual wellness visit    Preventive Care 65 Years and Older, Female Preventive care refers to lifestyle choices and visits with your health care provider that can promote health and wellness. What does preventive care include? A yearly physical exam. This is also called an annual well check. Dental exams once or twice a year. Routine eye exams. Ask your health care provider how often you should have your eyes checked. Personal lifestyle choices, including: Daily care of your teeth and gums. Regular physical activity. Eating a healthy diet. Avoiding tobacco and drug use. Limiting alcohol use. Practicing safe sex. Taking low-dose aspirin every day. Taking vitamin and mineral supplements as recommended by your health care provider. What happens during an annual well  check? The services and screenings done by your health care provider during your annual well check will depend on your age, overall health, lifestyle risk factors, and family history of disease. Counseling  Your health care provider may ask you questions about your: Alcohol use. Tobacco use. Drug use. Emotional well-being. Home and relationship well-being. Sexual activity. Eating habits. History of falls. Memory and ability to understand (cognition). Work and work Statistician. Reproductive health. Screening  You may have the following tests or measurements: Height, weight, and BMI. Blood pressure. Lipid and cholesterol levels. These may be checked every 5 years, or more frequently if you are over 5 years old. Skin check. Lung cancer screening. You may have this screening every year starting at age 53 if you have a 30-pack-year history of smoking and currently smoke or have quit within the past 15 years. Fecal occult blood test (FOBT) of the stool. You may have this test every year starting at age 49. Flexible sigmoidoscopy or colonoscopy. You may have a sigmoidoscopy every 5 years or a colonoscopy every 10 years starting at age 50. Hepatitis C blood test. Hepatitis B blood test. Sexually transmitted disease (STD) testing. Diabetes screening. This is done by checking your blood sugar (glucose) after you have not eaten for a while (fasting). You may have this done every 1-3 years. Bone density scan. This is done to screen for osteoporosis. You may have this done starting at age 60. Mammogram. This  may be done every 1-2 years. Talk to your health care provider about how often you should have regular mammograms. Talk with your health care provider about your test results, treatment options, and if necessary, the need for more tests. Vaccines  Your health care provider may recommend certain vaccines, such as: Influenza vaccine. This is recommended every year. Tetanus, diphtheria, and  acellular pertussis (Tdap, Td) vaccine. You may need a Td booster every 10 years. Zoster vaccine. You may need this after age 42. Pneumococcal 13-valent conjugate (PCV13) vaccine. One dose is recommended after age 60. Pneumococcal polysaccharide (PPSV23) vaccine. One dose is recommended after age 79. Talk to your health care provider about which screenings and vaccines you need and how often you need them. This information is not intended to replace advice given to you by your health care provider. Make sure you discuss any questions you have with your health care provider. Document Released: 01/09/2016 Document Revised: 09/01/2016 Document Reviewed: 10/14/2015 Elsevier Interactive Patient Education  2017 [place] Prevention in the Home Falls can cause injuries. They can happen to people of all ages. There are many things you can do to make your home safe and to help prevent falls. What can I do on the outside of my home? Regularly fix the edges of walkways and driveways and fix any cracks. Remove anything that might make you trip as you walk through a door, such as a raised step or threshold. Trim any bushes or trees on the path to your home. Use bright outdoor lighting. Clear any walking paths of anything that might make someone trip, such as rocks or tools. Regularly check to see if handrails are loose or broken. Make sure that both sides of any steps have handrails. Any raised decks and porches should have guardrails on the edges. Have any leaves, snow, or ice cleared regularly. Use sand or salt on walking paths during winter. Clean up any spills in your garage right away. This includes oil or grease spills. What can I do in the bathroom? Use night lights. Install grab bars by the toilet and in the tub and shower. Do not use towel bars as grab bars. Use non-skid mats or decals in the tub or shower. If you need to sit down in the shower, use a plastic, non-slip stool. Keep  the floor dry. Clean up any water that spills on the floor as soon as it happens. Remove soap buildup in the tub or shower regularly. Attach bath mats securely with double-sided non-slip rug tape. Do not have throw rugs and other things on the floor that can make you trip. What can I do in the bedroom? Use night lights. Make sure that you have a light by your bed that is easy to reach. Do not use any sheets or blankets that are too big for your bed. They should not hang down onto the floor. Have a firm chair that has side arms. You can use this for support while you get dressed. Do not have throw rugs and other things on the floor that can make you trip. What can I do in the kitchen? Clean up any spills right away. Avoid walking on wet floors. Keep items that you use a lot in easy-to-reach places. If you need to reach something above you, use a strong step stool that has a grab bar. Keep electrical cords out of the way. Do not use floor polish or wax that makes floors slippery. If you must  use wax, use non-skid floor wax. Do not have throw rugs and other things on the floor that can make you trip. What can I do with my stairs? Do not leave any items on the stairs. Make sure that there are handrails on both sides of the stairs and use them. Fix handrails that are broken or loose. Make sure that handrails are as long as the stairways. Check any carpeting to make sure that it is firmly attached to the stairs. Fix any carpet that is loose or worn. Avoid having throw rugs at the top or bottom of the stairs. If you do have throw rugs, attach them to the floor with carpet tape. Make sure that you have a light switch at the top of the stairs and the bottom of the stairs. If you do not have them, ask someone to add them for you. What else can I do to help prevent falls? Wear shoes that: Do not have high heels. Have rubber bottoms. Are comfortable and fit you well. Are closed at the toe. Do not  wear sandals. If you use a stepladder: Make sure that it is fully opened. Do not climb a closed stepladder. Make sure that both sides of the stepladder are locked into place. Ask someone to hold it for you, if possible. Clearly mark and make sure that you can see: Any grab bars or handrails. First and last steps. Where the edge of each step is. Use tools that help you move around (mobility aids) if they are needed. These include: Canes. Walkers. Scooters. Crutches. Turn on the lights when you go into a dark area. Replace any light bulbs as soon as they burn out. Set up your furniture so you have a clear path. Avoid moving your furniture around. If any of your floors are uneven, fix them. If there are any pets around you, be aware of where they are. Review your medicines with your doctor. Some medicines can make you feel dizzy. This can increase your chance of falling. Ask your doctor what other things that you can do to help prevent falls. This information is not intended to replace advice given to you by your health care provider. Make sure you discuss any questions you have with your health care provider. Document Released: 10/09/2009 Document Revised: 05/20/2016 Document Reviewed: 01/17/2015 Elsevier Interactive Patient Education  2017 [employer].

## 2022-12-13 ENCOUNTER — Other Ambulatory Visit: Payer: PPO

## 2022-12-13 ENCOUNTER — Inpatient Hospital Stay: Admission: RE | Admit: 2022-12-13 | Payer: PPO | Source: Ambulatory Visit

## 2022-12-15 ENCOUNTER — Ambulatory Visit: Payer: PPO | Admitting: Podiatry

## 2022-12-22 ENCOUNTER — Ambulatory Visit: Payer: PPO | Admitting: Podiatry

## 2023-01-14 ENCOUNTER — Ambulatory Visit (INDEPENDENT_AMBULATORY_CARE_PROVIDER_SITE_OTHER): Payer: PPO | Admitting: Family Medicine

## 2023-01-14 ENCOUNTER — Encounter: Payer: Self-pay | Admitting: Family Medicine

## 2023-01-14 VITALS — BP 118/80 | HR 74 | Temp 99.7°F | Ht 62.0 in | Wt 153.8 lb

## 2023-01-14 DIAGNOSIS — U071 COVID-19: Secondary | ICD-10-CM | POA: Insufficient documentation

## 2023-01-14 DIAGNOSIS — J3489 Other specified disorders of nose and nasal sinuses: Secondary | ICD-10-CM

## 2023-01-14 LAB — POCT INFLUENZA A/B
Influenza A, POC: NEGATIVE
Influenza B, POC: NEGATIVE

## 2023-01-14 LAB — POC COVID19 BINAXNOW: SARS Coronavirus 2 Ag: POSITIVE — AB

## 2023-01-14 MED ORDER — NIRMATRELVIR/RITONAVIR (PAXLOVID) TABLET (RENAL DOSING): 2.0000 | ORAL_TABLET | Freq: Two times a day (BID) | ORAL | 0 refills | Status: AC

## 2023-01-14 MED ORDER — BENZONATATE 100 MG PO CAPS: 100.0000 mg | ORAL_CAPSULE | Freq: Two times a day (BID) | ORAL | 0 refills | Status: DC | PRN

## 2023-01-14 NOTE — Progress Notes (Signed)
Tommi Rumps, MD Phone: [phone]  KC SEDLAK is a 72 y.o. female who presents today for same-day visit.  Respiratory infection: Patient notes onset of symptoms yesterday.  She has had congestion, cough, sore throat, and postnasal drip.  She also feels fatigued.  No fever or shortness of breath.  She does have some body aches.  She is a Oceanographer.  No known COVID exposure.  She has tried taking an allergy pill today with no benefit.  Social History   Tobacco Use  Smoking Status Never  Smokeless Tobacco Never    Current Outpatient Medications on File Prior to Visit  Medication Sig Dispense Refill   acetaminophen (TYLENOL) 500 MG tablet Take 500-1,000 mg by mouth every 6 (six) hours as needed (for pain/headaches.).     acyclovir (ZOVIRAX) 400 MG tablet Take 1 tablet by mouth once daily 90 tablet 0   aspirin 81 MG chewable tablet 1 tablet     atorvastatin (LIPITOR) 20 MG tablet Take 1 tablet (20 mg total) by mouth daily. 90 tablet 3   Cholecalciferol (VITAMIN D) 2000 units tablet Take 2,000 Units by mouth daily.     levothyroxine (SYNTHROID) 50 MCG tablet 1 TABLET DAILY X 6 DAYS,  1.5 TABLETS ON DAY 7   REPEAT WEEKLY 90 tablet 0   No current facility-administered medications on file prior to visit.     ROS see history of present illness  Objective  Physical Exam Vitals:   01/14/23 1539  BP: 118/80  Pulse: 74  Temp: 99.7 F (37.6 C)  SpO2: 98%    BP Readings from Last 3 Encounters:  01/14/23 118/80  10/06/22 128/76  07/27/22 118/80   Wt Readings from Last 3 Encounters:  01/14/23 153 lb 12.8 oz (69.8 kg)  12/01/22 160 lb (72.6 kg)  10/06/22 160 lb (72.6 kg)    Physical Exam Constitutional:      General: She is not in acute distress.    Appearance: She is not diaphoretic.  HENT:     Right Ear: Tympanic membrane normal.     Left Ear: Tympanic membrane normal.  Cardiovascular:     Rate and Rhythm: Normal rate and regular rhythm.     Heart  sounds: Normal heart sounds.  Pulmonary:     Effort: Pulmonary effort is normal.     Breath sounds: Normal breath sounds.  Lymphadenopathy:     Cervical: No cervical adenopathy.  Skin:    General: Skin is warm and dry.  Neurological:     Mental Status: She is alert.      Assessment/Plan: Please see individual problem list.  COVID Assessment & Plan: Patient has COVID-19.  Positive rapid COVID test today.  Discussed treating with Paxlovid.  This will be renally dosed.  Discussed the risk of diarrhea, abnormal taste in her mouth, and elevated blood pressure with this medication.  Discussed the purpose of this medication is to reduce the risk of her getting hospitalized or dying from [place].  Advised that it may help improve her symptoms quicker though it may not.  Discussed the risk of COVID rebound after coming off of the Paxlovid.  She was advised to remain off of her statin while on this medication and for a few days after finishing the Paxlovid.  Discussed starting the Paxlovid 12 hours after she took her last dose of statin.  Discussed seeking medical attention if she develops shortness of breath, cough productive of blood, or fevers of 100 F or higher.  She will contact us if she has worsening symptoms.  We are treat cough with Tessalon.  If not beneficial enough she will let us know.  She was advised to quarantine at least through 01/18/2023 and then wear a mask for 5 days after that.  Discussed she could come off of quarantine on the 24th if her symptoms are significantly improved and she has been afebrile for at least 24 hours. Discussed avoiding her husband in their house and should wear a mask around each other. Advised her husband test if he gets symptoms.   Orders: -     nirmatrelvir/ritonavir (renal dosing); Take 2 tablets by mouth 2 (two) times daily for 5 days. (Take nirmatrelvir 150 mg one tablet twice daily for 5 days and ritonavir 100 mg one tablet twice daily for 5 days) Patient  GFR is 52  Dispense: 20 tablet; Refill: 0 -     Benzonatate; Take 1 capsule (100 mg total) by mouth 2 (two) times daily as needed for cough.  Dispense: 20 capsule; Refill: 0  Sinus pain -     POC COVID-19 BinaxNow -     POCT Influenza A/B    Return if symptoms worsen or fail to improve.   Tommi Rumps, MD [place]

## 2023-01-14 NOTE — Assessment & Plan Note (Signed)
Patient has COVID-19.  Positive rapid COVID test today.  Discussed treating with Paxlovid.  This will be renally dosed.  Discussed the risk of diarrhea, abnormal taste in her mouth, and elevated blood pressure with this medication.  Discussed the purpose of this medication is to reduce the risk of her getting hospitalized or dying from [place].  Advised that it may help improve her symptoms quicker though it may not.  Discussed the risk of COVID rebound after coming off of the Paxlovid.  She was advised to remain off of her statin while on this medication and for a few days after finishing the Paxlovid.  Discussed starting the Paxlovid 12 hours after she took her last dose of statin.  Discussed seeking medical attention if she develops shortness of breath, cough productive of blood, or fevers of 100 F or higher.  She will contact us if she has worsening symptoms.  We are treat cough with Tessalon.  If not beneficial enough she will let us know.  She was advised to quarantine at least through 01/18/2023 and then wear a mask for 5 days after that.  Discussed she could come off of quarantine on the 24th if her symptoms are significantly improved and she has been afebrile for at least 24 hours. Discussed avoiding her husband in their house and should wear a mask around each other. Advised her husband test if he gets symptoms.

## 2023-01-14 NOTE — Patient Instructions (Addendum)
Nice to see you. I am treating you with Paxlovid for your COVID-19 infection.  You can use the Tessalon for cough.  If the Tessalon is not helpful please let us know early next week. The Paxlovid can cause diarrhea, abnormal taste in your mouth, and some elevated blood pressures. You will need to remain quarantined at least through 01/18/2023.  If your symptoms have improved significantly and you have been afebrile 01/18/2023.  If your symptoms have improved significantly and you have been afebrile for more than 24 hours without use of Tylenol or ibuprofen then you can come off quarantine the next day and wear a mask for the next 5 days.  If your symptoms have not improved significantly by the 23rd you need to remain quarantined until your symptoms have been improved for at least 24 hours.  The last day that she would absolutely need to remain quarantine would be 01/23/2023. If your husband developed symptoms he does need to be tested.  You do need to try to avoid him in the house. If you have any worsening symptoms please let us know. If you develop shortness of breath, cough productive of blood, or fever of 103 F or higher please seek medical attention in person.

## 2023-01-19 ENCOUNTER — Ambulatory Visit: Payer: PPO | Admitting: Podiatry

## 2023-02-01 ENCOUNTER — Telehealth: Payer: Self-pay | Admitting: Internal Medicine

## 2023-02-01 NOTE — Telephone Encounter (Signed)
Copied from [place]. Topic: General - Other >> Feb 01, 2023  3:21 PM Ja-Kwan M wrote: Reason for CRM: Pt requested to speak with Hollie Salk. Pt requests that Bonnita Nasuti return her call at [phone]

## 2023-04-06 ENCOUNTER — Other Ambulatory Visit: Payer: Self-pay | Admitting: Internal Medicine

## 2023-04-18 ENCOUNTER — Other Ambulatory Visit: Payer: Self-pay | Admitting: Internal Medicine

## 2023-04-18 ENCOUNTER — Telehealth: Payer: Self-pay | Admitting: Internal Medicine

## 2023-04-18 DIAGNOSIS — N6312 Unspecified lump in the right breast, upper inner quadrant: Secondary | ICD-10-CM

## 2023-04-18 NOTE — Telephone Encounter (Signed)
Attempted to call pt no answer no voicemail.  

## 2023-04-18 NOTE — Telephone Encounter (Signed)
Pt called in asking when its the next available opening for Dr. Darrick Huntsman. Its 06/01/23. As per pt, that's too far, she can't wait that long. As per pt, she would like to check her TSH, so she was wondering if DR. Tullo can put the orders in? Any questions, pt can be reach at [facility] or [phone].

## 2023-04-20 NOTE — Telephone Encounter (Signed)
Pt its booked for lab for 4/25.

## 2023-04-21 ENCOUNTER — Other Ambulatory Visit (INDEPENDENT_AMBULATORY_CARE_PROVIDER_SITE_OTHER): Payer: PPO

## 2023-04-21 DIAGNOSIS — E0500 Thyrotoxicosis with diffuse goiter without thyrotoxic crisis or storm: Secondary | ICD-10-CM

## 2023-04-21 DIAGNOSIS — R944 Abnormal results of kidney function studies: Secondary | ICD-10-CM

## 2023-04-22 LAB — BASIC METABOLIC PANEL
BUN: 15 mg/dL (ref 6–23)
CO2: 27 mEq/L (ref 19–32)
Calcium: 9.1 mg/dL (ref 8.4–10.5)
Chloride: 105 mEq/L (ref 96–112)
Creatinine, Ser: 0.9 mg/dL (ref 0.40–1.20)
GFR: 63.73 mL/min (ref >60.00)
Glucose, Bld: 79 mg/dL (ref 70–99)
Potassium: 4.2 mEq/L (ref 3.5–5.1)
Sodium: 140 mEq/L (ref 135–145)

## 2023-04-22 LAB — TSH: TSH: 2.06 u[IU]/mL (ref 0.35–5.50)

## 2023-04-29 ENCOUNTER — Ambulatory Visit
Admission: RE | Admit: 2023-04-29 | Discharge: 2023-04-29 | Disposition: A | Discharge status: Home / Self Care | Payer: PPO | Source: Ambulatory Visit | Attending: Internal Medicine | Admitting: Internal Medicine

## 2023-04-29 DIAGNOSIS — N6312 Unspecified lump in the right breast, upper inner quadrant: Secondary | ICD-10-CM | POA: Diagnosis not present

## 2023-04-29 DIAGNOSIS — N6489 Other specified disorders of breast: Secondary | ICD-10-CM | POA: Diagnosis not present

## 2023-04-29 DIAGNOSIS — R92323 Mammographic fibroglandular density, bilateral breasts: Secondary | ICD-10-CM | POA: Diagnosis not present

## 2023-07-11 ENCOUNTER — Ambulatory Visit: Payer: PPO | Admitting: Family

## 2023-07-11 ENCOUNTER — Encounter: Payer: Self-pay | Admitting: Family

## 2023-07-11 VITALS — BP 130/78 | HR 53 | Temp 97.9°F | Ht 62.0 in | Wt 150.0 lb

## 2023-07-11 DIAGNOSIS — R42 Dizziness and giddiness: Secondary | ICD-10-CM | POA: Diagnosis not present

## 2023-07-11 DIAGNOSIS — B001 Herpesviral vesicular dermatitis: Secondary | ICD-10-CM

## 2023-07-11 MED ORDER — MECLIZINE HCL 12.5 MG PO TABS: 12.5000 mg | ORAL_TABLET | Freq: Three times a day (TID) | ORAL | 0 refills | Status: DC | PRN

## 2023-07-11 MED ORDER — ACYCLOVIR 400 MG PO TABS
400.0000 mg | ORAL_TABLET | Freq: Every day | ORAL | 0 refills | Status: DC | PRN
Start: 2023-07-11 — End: 2024-04-09

## 2023-07-11 NOTE — Patient Instructions (Addendum)
Trial of Meclizine .  This medication can be used as needed.  It can be sedating.  Referral for vestibular rehab.  We will call you to schedule  At this point, I suspect Benign paroxysmal positional vertigo (BPPV) and have included information from [facility] below.   If dizziness/vertigo persists, I recommend neuroimaging and referral to ENT for further evaluation.  If there is no improvement in your symptoms, or if there is any worsening of symptoms, or if you have any additional concerns, please return to this clinic for re-evaluation; or, if we are closed, consider going to the Emergency Room for evaluation.   What is BPPV?  BPPV  is one of the most common causes of vertigo -- the sudden sensation that you're spinning or that the inside of your head is spinning. Benign paroxysmal positional vertigo causes brief episodes of mild to intense dizziness. Benign paroxysmal positional vertigo is usually triggered by specific changes in the position of your head. This might occur when you tip your head up or down, when you lie down, or when you turn over or sit up in bed. Although benign paroxysmal positional vertigo can be a bothersome problem, it's rarely serious except when it increases the chance of falls.  If you experience dizziness associated with benign paroxysmal positional vertigo (BPPV), consider these tips: Be aware of the possibility of losing your balance, which can lead to falling and serious injury.  Sit down immediately when you feel dizzy.  Use good lighting if you get up at night.  Walk with a cane for stability if you're at risk of falling.  Work closely with your doctor to manage your symptoms effectively. BPPV may recur even after successful therapy. Fortunately, although there's no cure, the condition can be managed with physical therapy and home treatments.   Dizziness [Uncertain Cause] Dizziness is a common symptom sometimes described as "lightheadedness" or feeling  like you are going to faint. If it lasts for only a few seconds and is related to changes in position (such as getting up after lying or sitting for a long time), it is usually not a sign of anything serious. Dizziness that lasts for minutes to hours, or comes on for no apparent reason, may be a sign of a more serious problem (such as dehydration, a medicine reaction, disease of the heart or brain). Today's exam did not show an exact cause for your dizzy spell . Sometimes additional tests are required before a cause can be found. Therefore, it is important to follow up with your doctor if your symptoms continue. Home Care: 1) If a dizzy spell occurs and lasts more than a few seconds, lie down until it passes. If you are lying down, then you cannot hurt yourself by falling if you do faint. 2) Do not drive or operate dangerous equipment until the dizzy spells have stopped for at least 48 hours. 3) If dizzy spells occur with sudden standing, this may be a sign of mild dehydration. Drink extra fluids over the next few days. 4) If you recently started a new medicine or if you had the dose of a current medicine increased (especially blood pressure medicine), talk with the prescribing doctor about your symptoms. Dose adjustments may be needed. Follow Up with your doctor for further evaluation within the next seven days, if your symptoms continue. Get Prompt Medical Attention if any of the following occur: -- Worsening of your symptoms -- Fainting, headache or seizure -- Repeated vomiting -- Feeling  like you or the room is spinning -- Chest, arm, neck, back or jaw pain -- Palpitations (the sense that your heart is fluttering or beating fast or hard) -- Shortness of breath -- Blood in vomit or stool (black or red color) -- Weakness of an arm or leg or one side of the face -- Difficulty with speech or vision  2000-2015 [employer] [address], [place], [place] [address]. All rights  reserved. This information is not intended as a substitute for professional medical care. Always follow your healthcare professional's instructions.

## 2023-07-11 NOTE — Assessment & Plan Note (Signed)
Presentation most consistent with vertigo.  Patient is not orthostatic.  She does not describe dizziness or palpitations.  Fortunately no vascular symptoms such as pulsatile tinnitus to warrant urgent vascular and neuroimaging at this time.  We discussed MRI brain with [facility]. Patient politely declines. She prefers to start medication and vestibular rehab.  Trial of meclizine, referral to PT.  Close follow-up

## 2023-07-11 NOTE — Progress Notes (Signed)
Assessment & Plan:  Vertigo Assessment & Plan: Presentation most consistent with vertigo.  Patient is not orthostatic.  She does not describe dizziness or palpitations.  Fortunately no vascular symptoms such as pulsatile tinnitus to warrant urgent vascular and neuroimaging at this time.  We discussed MRI brain with IAC. Patient politely declines. She prefers to start medication and vestibular rehab.  Trial of meclizine, referral to PT.  Close follow-up  Orders: -     Ambulatory referral to Physical Therapy -     Meclizine HCl; Take 1 tablet (12.5 mg total) by mouth 3 (three) times daily as needed (vertigo).  Dispense: 30 tablet; Refill: 0  Cold sore -     Acyclovir; Take 1 tablet (400 mg total) by mouth daily as needed (cold sore).  Dispense: 30 tablet; Refill: 0     Return precautions given.   Risks, benefits, and alternatives of the medications and treatment plan prescribed today were discussed, and patient expressed understanding.   Education regarding symptom management and diagnosis given to patient on AVS either electronically or printed.  Return in about 6 weeks (around 08/22/2023).  Rennie Plowman, FNP  Subjective:    Patient ID: Katelyn Friedman, female    DOB: 14-Oct-1951, 72 y.o.   MRN: [id number]  CC: Katelyn Friedman is a 72 y.o. female who presents today for an acute visit.    HPI: Complains of episodic vertigo and room will start to spin. Endorses feeling imbalanced when episode occurs.   Episodes started on month ago. She has had 3-4 episodes in total. Symptom resolves after 5-10 min with sitting.   There are times when she lays back in her bed and she will start to spin.   She describes an  incident in which she was with her granddaughter , 2 years, and she fell backward with granddaughter 4-5 weeks ago. No loc, laceration. She fell on nightstand.   She can hear a 'tunnel' sound in ears.   No CP, palpitations, numbness, dizziness, headaches, vision changes,  fever, N, V, tinnitus, ear pain, pulsatile sensation, sinus congestion.     She is staying hydrated. She walks her dog daily 2-3 times per day.  She does house work.  She reports living an active lifestyle.       Allergies: Influenza vaccines, Amoxicillin, Codeine, and Mobic [meloxicam] Current Outpatient Medications on File Prior to Visit  Medication Sig Dispense Refill   acetaminophen (TYLENOL) 500 MG tablet Take 500-1,000 mg by mouth every 6 (six) hours as needed (for pain/headaches.).     aspirin 81 MG chewable tablet 1 tablet     atorvastatin (LIPITOR) 20 MG tablet Take 1 tablet (20 mg total) by mouth daily. 90 tablet 3   benzonatate (TESSALON) 100 MG capsule Take 1 capsule (100 mg total) by mouth 2 (two) times daily as needed for cough. 20 capsule 0   Cholecalciferol (VITAMIN D) 2000 units tablet Take 2,000 Units by mouth daily.     levothyroxine (SYNTHROID) 50 MCG tablet TAKE 1 TABLET DAILY FOR 6 DAYS, THEN 1& 1/2 (ONE AND ONE-HALF) TABLETS ON DAY 7. REPEAT WEEKLY 90 tablet 1   No current facility-administered medications on file prior to visit.    Review of Systems  Constitutional:  Negative for chills and fever.  Eyes:  Negative for visual disturbance.  Respiratory:  Negative for cough.   Cardiovascular:  Negative for chest pain and palpitations.  Gastrointestinal:  Negative for nausea and vomiting.  Neurological:  Negative for dizziness,  syncope and headaches.  Psychiatric/Behavioral:  Negative for confusion.       Objective:    BP 130/78   Pulse (!) 53   Temp 97.9 F (36.6 C)   Ht 5\' 2"  (1.575 m)   Wt 150 lb (68 kg)   SpO2 99%   BMI 27.44 kg/m   BP Readings from Last 3 Encounters:  07/11/23 130/78  01/14/23 118/80  10/06/22 128/76   Wt Readings from Last 3 Encounters:  07/11/23 150 lb (68 kg)  01/14/23 153 lb 12.8 oz (69.8 kg)  12/01/22 160 lb (72.6 kg)  Orthostatic VS for the past 24 hrs (Last 3 readings):  BP- Lying Pulse- Lying BP- Sitting Pulse-  Sitting BP- Standing at 0 minutes Pulse- Standing at 0 minutes  07/11/23 1526 144/82 55 142/70 55 138/76 55     Physical Exam Vitals reviewed.  Constitutional:      Appearance: She is well-developed.  HENT:     Head: Normocephalic and atraumatic.     Right Ear: Hearing, tympanic membrane, ear canal and external ear normal. No swelling or tenderness. No middle ear effusion. Tympanic membrane is not erythematous or bulging.     Left Ear: Tympanic membrane, ear canal and external ear normal. No swelling or tenderness.  No middle ear effusion. Tympanic membrane is not erythematous or bulging.     Nose: Nose normal. No rhinorrhea.     Right Sinus: No maxillary sinus tenderness or frontal sinus tenderness.     Left Sinus: No maxillary sinus tenderness or frontal sinus tenderness.     Mouth/Throat:     Pharynx: Uvula midline. No posterior oropharyngeal erythema.  Eyes:     General: Lids are normal. Lids are everted, no foreign bodies appreciated.     Conjunctiva/sclera: Conjunctivae normal.     Pupils: Pupils are equal, round, and reactive to light.     Comments: Normal fundus bilaterally   Cardiovascular:     Rate and Rhythm: Normal rate and regular rhythm.     Pulses: Normal pulses.     Heart sounds: Normal heart sounds.  Pulmonary:     Effort: Pulmonary effort is normal.     Breath sounds: Normal breath sounds. No wheezing, rhonchi or rales.  Lymphadenopathy:     Head:     Right side of head: No submental, submandibular, tonsillar, preauricular, posterior auricular or occipital adenopathy.     Left side of head: No submental, submandibular, tonsillar, preauricular, posterior auricular or occipital adenopathy.     Cervical: No cervical adenopathy.     Right cervical: No superficial, deep or posterior cervical adenopathy.    Left cervical: No superficial, deep or posterior cervical adenopathy.  Skin:    General: Skin is warm and dry.  Neurological:     Mental Status: She is alert.      Cranial Nerves: No cranial nerve deficit.     Sensory: No sensory deficit.     Deep Tendon Reflexes:     Reflex Scores:      Bicep reflexes are 2+ on the right side and 2+ on the left side.      Patellar reflexes are 2+ on the right side and 2+ on the left side.    Comments: Grip equal and strong bilateral upper extremities. Gait strong and steady. Able to perform  finger-to-nose without difficulty.  Dix hall pike maneuver did not elicit vertigo. No nystagmus noted.    Psychiatric:        Speech: Speech normal.  Behavior: Behavior normal.        Thought Content: Thought content normal.

## 2023-07-17 ENCOUNTER — Other Ambulatory Visit: Payer: Self-pay | Admitting: Internal Medicine

## 2023-07-27 ENCOUNTER — Encounter (INDEPENDENT_AMBULATORY_CARE_PROVIDER_SITE_OTHER): Payer: Self-pay

## 2023-08-19 ENCOUNTER — Ambulatory Visit: Payer: PPO | Admitting: Physical Therapy

## 2023-08-21 ENCOUNTER — Other Ambulatory Visit: Payer: Self-pay | Admitting: Internal Medicine

## 2023-08-23 ENCOUNTER — Ambulatory Visit: Payer: PPO | Admitting: Family

## 2023-10-10 DIAGNOSIS — H2513 Age-related nuclear cataract, bilateral: Secondary | ICD-10-CM | POA: Diagnosis not present

## 2023-10-14 ENCOUNTER — Other Ambulatory Visit: Payer: Self-pay | Admitting: Family

## 2023-10-17 ENCOUNTER — Other Ambulatory Visit: Payer: Self-pay | Admitting: Internal Medicine

## 2023-10-19 ENCOUNTER — Telehealth: Payer: Self-pay

## 2023-10-19 NOTE — Telephone Encounter (Signed)
Attempted to call [facility] no answer then got disconnected.

## 2023-10-19 NOTE — Telephone Encounter (Signed)
Received a fax from [facility] request a verbal okay to change the manufacturer of pt's Levothyroxine.

## 2023-10-24 NOTE — Telephone Encounter (Signed)
Spoke with pharmacy to give the verbal okay to change manufacturer. Called pt to let her know that she can pick up the rx but that we will need to recheck her TSH in 6 weeks. Pt was upset because she has been out of her medication for a week. She stated that she has been feeling nervous and shaky and wasn't sure if it was coming from not being on the thyroid medication. She wanted to know if she still needed to wait 6 weeks before having her TSH level rechecked.

## 2023-10-24 NOTE — Telephone Encounter (Signed)
Patient just called and said [facility] has her medication. They are just waiting for her provider to approve it. She said [facility] sent a fax to her provider. Patient said she has been out of her thyroid medication for a week now. And she wants to know what the update is.

## 2023-10-24 NOTE — Telephone Encounter (Signed)
Pt called wanting an update on her thyroid medication

## 2023-10-25 ENCOUNTER — Other Ambulatory Visit: Payer: Self-pay

## 2023-10-25 ENCOUNTER — Ambulatory Visit: Payer: PPO | Admitting: Podiatry

## 2023-10-25 MED ORDER — ATORVASTATIN CALCIUM 20 MG PO TABS: 20.0000 mg | ORAL_TABLET | Freq: Every day | ORAL | 0 refills | Status: DC

## 2023-10-25 NOTE — Telephone Encounter (Signed)
LMTCB

## 2023-10-28 NOTE — Telephone Encounter (Signed)
Spoke with pt yesterday and informed her that she will need to start back on the mediation and then have the lab done once she has been on it for 6 weeks. Pt gave a verbal understanding and schedule a lab appt.

## 2023-11-03 ENCOUNTER — Encounter: Payer: Self-pay | Admitting: Podiatry

## 2023-11-03 ENCOUNTER — Ambulatory Visit: Payer: PPO | Admitting: Podiatry

## 2023-11-03 DIAGNOSIS — M62461 Contracture of muscle, right lower leg: Secondary | ICD-10-CM

## 2023-11-03 DIAGNOSIS — M722 Plantar fascial fibromatosis: Secondary | ICD-10-CM | POA: Diagnosis not present

## 2023-11-03 NOTE — Progress Notes (Signed)
Subjective:  Patient ID: Katelyn Friedman, female    DOB: [birth year],  MRN: [id number]  Chief Complaint  Patient presents with   Plantar Fasciitis    "It's better but it still hurts.  It's my right heel.  I been taking Tylenol."    72 y.o. female presents with the above complaint.  Patient presents with primary complaint of right heel pain that has been going for quite some time hurts with ambulation worse with pressure patient would like to discuss treatment options for this.  She has not seen MRIs prior to seeing me for this.  She is known to Dr. Al Corpus is still hurts but that she is been taking Tylenol pain scale 7 out of 10 dull aching nature   Review of Systems: Negative except as noted in the HPI. Denies N/V/F/Ch.  Past Medical History:  Diagnosis Date   Allergic rhinitis    Arthritis    Family history of adverse reaction to anesthesia    PONV   GERD (gastroesophageal reflux disease)    Herpes simplex    History of colon polyps    IBS (irritable bowel syndrome)    PONV (postoperative nausea and vomiting)    Thyroid disease    graves disease    Current Outpatient Medications:    acetaminophen (TYLENOL) 500 MG tablet, Take 500-1,000 mg by mouth every 6 (six) hours as needed (for pain/headaches.)., Disp: , Rfl:    acyclovir (ZOVIRAX) 400 MG tablet, Take 1 tablet (400 mg total) by mouth daily as needed (cold sore)., Disp: 30 tablet, Rfl: 0   aspirin 81 MG chewable tablet, 1 tablet, Disp: , Rfl:    atorvastatin (LIPITOR) 20 MG tablet, Take 1 tablet (20 mg total) by mouth daily., Disp: 90 tablet, Rfl: 0   benzonatate (TESSALON) 100 MG capsule, Take 1 capsule (100 mg total) by mouth 2 (two) times daily as needed for cough., Disp: 20 capsule, Rfl: 0   Cholecalciferol (VITAMIN D) 2000 units tablet, Take 2,000 Units by mouth daily., Disp: , Rfl:    levothyroxine (SYNTHROID) 50 MCG tablet, TAKE 1 TABLET BY MOUTH ONCE DAILY FOR 6 DAYS THEN  TAKE  ONE  AND  ONE-HALF  TABLETS  ON  DAY  7   AND  REPEAT  WEEKLY, Disp: 90 tablet, Rfl: 0   meclizine (ANTIVERT) 12.5 MG tablet, Take 1 tablet (12.5 mg total) by mouth 3 (three) times daily as needed (vertigo)., Disp: 30 tablet, Rfl: 0  Social History   Tobacco Use  Smoking Status Never  Smokeless Tobacco Never    Allergies  Allergen Reactions   Influenza Vaccines Swelling and Other (See Comments)    Redness, Fever   Amoxicillin Diarrhea   Codeine Nausea Only   Mobic [Meloxicam] Itching and Rash    Nausea Vomiting   Objective:  There were no vitals filed for this visit. There is no height or weight on file to calculate BMI. Constitutional Well developed. Well nourished.  Vascular Dorsalis pedis pulses palpable bilaterally. Posterior tibial pulses palpable bilaterally. Capillary refill normal to all digits.  No cyanosis or clubbing noted. Pedal hair growth normal.  Neurologic Normal speech. Oriented to person, place, and time. Epicritic sensation to light touch grossly present bilaterally.  Dermatologic Nails well groomed and normal in appearance. No open wounds. No skin lesions.  Orthopedic: Normal joint ROM without pain or crepitus bilaterally. No visible deformities. Tender to palpation at the calcaneal tuber right. No pain with calcaneal squeeze right. Ankle ROM diminished range  of motion right. Silfverskiold Test: negative right.   Radiographs: None  Assessment:  No diagnosis found. Plan:  Patient was evaluated and treated and all questions answered.  Plantar Fasciitis, right with underlying gastrocnemius equinus - XR reviewed as above.  - Educated on icing and stretching. Instructions given.  - Injection delivered to the plantar fascia as below. - DME: Plantar fascial brace dispensed to support the medial longitudinal arch of the foot and offload pressure from the heel and prevent arch collapse during weightbearing - Pharmacologic management: None  Procedure: Injection Tendon/Ligament Location:  Right plantar fascia at the glabrous junction; medial approach. Skin Prep: alcohol Injectate: 0.5 cc 0.5% marcaine plain, 0.5 cc of 1% Lidocaine, 0.5 cc kenalog 10. Disposition: Patient tolerated procedure well. Injection site dressed with a band-aid.  No follow-ups on file.

## 2023-12-01 ENCOUNTER — Telehealth: Payer: Self-pay | Admitting: Internal Medicine

## 2023-12-01 ENCOUNTER — Ambulatory Visit: Payer: PPO | Admitting: Podiatry

## 2023-12-01 DIAGNOSIS — R7303 Prediabetes: Secondary | ICD-10-CM

## 2023-12-01 DIAGNOSIS — E890 Postprocedural hypothyroidism: Secondary | ICD-10-CM

## 2023-12-01 NOTE — Telephone Encounter (Signed)
Patient need lab orders.

## 2023-12-01 NOTE — Telephone Encounter (Signed)
Labs have been ordered

## 2023-12-05 ENCOUNTER — Other Ambulatory Visit (INDEPENDENT_AMBULATORY_CARE_PROVIDER_SITE_OTHER): Payer: PPO

## 2023-12-05 DIAGNOSIS — E890 Postprocedural hypothyroidism: Secondary | ICD-10-CM

## 2023-12-05 DIAGNOSIS — R7303 Prediabetes: Secondary | ICD-10-CM | POA: Diagnosis not present

## 2023-12-06 ENCOUNTER — Other Ambulatory Visit: Payer: PPO

## 2023-12-06 DIAGNOSIS — R7303 Prediabetes: Secondary | ICD-10-CM

## 2023-12-06 LAB — COMPREHENSIVE METABOLIC PANEL
ALT: 10 U/L (ref 0–35)
AST: 16 U/L (ref 0–37)
Albumin: 4.1 g/dL (ref 3.5–5.2)
Alkaline Phosphatase: 47 U/L (ref 39–117)
BUN: 17 mg/dL (ref 6–23)
CO2: 30 meq/L (ref 19–32)
Calcium: 8.9 mg/dL (ref 8.4–10.5)
Chloride: 105 meq/L (ref 96–112)
Creatinine, Ser: 0.93 mg/dL (ref 0.40–1.20)
GFR: 61.01 mL/min (ref >60.00)
Glucose, Bld: 82 mg/dL (ref 70–99)
Potassium: 4.3 meq/L (ref 3.5–5.1)
Sodium: 142 meq/L (ref 135–145)
Total Bilirubin: 0.7 mg/dL (ref 0.2–1.2)
Total Protein: 6.6 g/dL (ref 6.0–8.3)

## 2023-12-06 LAB — TSH: TSH: 5.3 u[IU]/mL (ref 0.35–5.50)

## 2023-12-07 LAB — HEMOGLOBIN A1C
Hgb A1c MFr Bld: 4.6 %{Hb} (ref <5.7)
Mean Plasma Glucose: 85 mg/dL
eAG (mmol/L): 4.7 mmol/L

## 2023-12-07 MED ORDER — LEVOTHYROXINE SODIUM 50 MCG PO TABS: ORAL_TABLET | ORAL | 0 refills | Status: DC

## 2023-12-07 NOTE — Addendum Note (Signed)
Addended by: Sherlene Shams on: 12/07/2023 10:13 AM   Modules accepted: Orders

## 2023-12-07 NOTE — Assessment & Plan Note (Signed)
Borderline underactive on 50 x 6..  75 x 1.  Regimen .  Increase to 50 x 6,  100 x 1 (Increase the dose on day 7 to 2 tablets,  continue one tablet all other days )  . Lab Results  Component Value Date   TSH 5.30 12/05/2023

## 2024-01-10 ENCOUNTER — Ambulatory Visit: Payer: PPO | Admitting: *Deleted

## 2024-01-10 VITALS — Ht 62.0 in | Wt 143.0 lb

## 2024-01-10 DIAGNOSIS — Z0000 Encounter for general adult medical examination without abnormal findings: Secondary | ICD-10-CM

## 2024-01-10 NOTE — Progress Notes (Signed)
 Subjective:   Katelyn Friedman is a 73 y.o. female who presents for Medicare Annual (Subsequent) preventive examination.  Visit Complete: Virtual I connected with  Katelyn Friedman on 01/10/24 by a audio enabled telemedicine application and verified that I am speaking with the correct person using two identifiers. This patient declined Interactive audio and acupuncturist. Therefore the visit was completed with audio only.   Patient Location: Home  Provider Location: Office/Clinic  I discussed the limitations of evaluation and management by telemedicine. The patient expressed understanding and agreed to proceed.  Vital Signs: Because this visit was a virtual/telehealth visit, some criteria may be missing or patient reported. Any vitals not documented were not able to be obtained and vitals that have been documented are patient reported.  Cardiac Risk Factors include: advanced age (>90men, >57 women)     Objective:    Today's Vitals   01/10/24 1539 01/10/24 1541  Weight: 143 lb (64.9 kg)   Height: 5' 2 (1.575 m)   PainSc:  5    Body mass index is 26.16 kg/m.     01/10/2024    3:56 PM 12/01/2022    3:43 PM 12/24/2019   11:58 AM 12/18/2018   10:13 AM 10/26/2017    8:21 AM 09/21/2017   11:15 AM 09/13/2017    9:00 AM  Advanced Directives  Does Patient Have a Medical Advance Directive? No No No Yes No No No  Type of Advance Directive    Healthcare Power of Attorney     Does patient want to make changes to medical advance directive?    No - Patient declined     Copy of Healthcare Power of Attorney in Chart?    No - copy requested     Would patient like information on creating a medical advance directive? No - Patient declined No - Patient declined No - Patient declined  No - Patient declined No - Patient declined No - Patient declined    Current Medications (verified) Outpatient Encounter Medications as of 01/10/2024  Medication Sig   acetaminophen  (TYLENOL ) 500  MG tablet Take 500-1,000 mg by mouth every 6 (six) hours as needed (for pain/headaches.).   acyclovir  (ZOVIRAX ) 400 MG tablet Take 1 tablet (400 mg total) by mouth daily as needed (cold sore).   Ascorbic Acid (VITAMIN C) 1000 MG tablet Take 1,000 mg by mouth daily.   aspirin  81 MG chewable tablet 1 tablet   atorvastatin  (LIPITOR) 20 MG tablet Take 1 tablet (20 mg total) by mouth daily.   Cholecalciferol (VITAMIN D ) 2000 units tablet Take 2,000 Units by mouth daily.   Cyanocobalamin (VITAMIN B 12 PO) Take by mouth daily.   levothyroxine  (SYNTHROID ) 50 MCG tablet Increase the dose on day 7 to 2 tablets,  continue one tablet all other days   meclizine  (ANTIVERT ) 12.5 MG tablet Take 1 tablet (12.5 mg total) by mouth 3 (three) times daily as needed (vertigo).   [DISCONTINUED] benzonatate  (TESSALON ) 100 MG capsule Take 1 capsule (100 mg total) by mouth 2 (two) times daily as needed for cough. (Patient not taking: Reported on 01/10/2024)   No facility-administered encounter medications on file as of 01/10/2024.    Allergies (verified) Influenza vaccines, Amoxicillin , Codeine, and Mobic  [meloxicam ]   History: Past Medical History:  Diagnosis Date   Allergic rhinitis    Arthritis    Family history of adverse reaction to anesthesia    PONV   GERD (gastroesophageal reflux disease)    Herpes simplex  History of colon polyps    IBS (irritable bowel syndrome)    PONV (postoperative nausea and vomiting)    Thyroid  disease    graves disease   Past Surgical History:  Procedure Laterality Date   BREAST BIOPSY Right 2014   negative   CHOLECYSTECTOMY N/A 09/21/2017   Procedure: LAPAROSCOPIC CHOLECYSTECTOMY WITH INTRAOPERATIVE CHOLANGIOGRAM;  Surgeon: Dessa Reyes ORN, MD;  Location: [facility] ORS;  Service: General;  Laterality: N/A;   COLONOSCOPY  05/07/2014   5 mm hyperplastic polyp removed from the rectosigmoid.  Maternal aunt with history colon cancer.  10-year follow-up planned.   KNEE  ARTHROSCOPY  1992   KNEE ARTHROSCOPY Right 06/08/2016   Procedure: ARTHROSCOPY KNEE- partial medial minisectomy and condroplasty;  Surgeon: Maude Herald, MD;  Location: [facility] OR;  Service: Orthopedics;  Laterality: Right;   ROTATOR CUFF REPAIR  2006   [facility]    TONSILLECTOMY     TUBAL LIGATION     Family History  Problem Relation Age of Onset   Stroke Mother    Alzheimer's disease Mother    Hypertension Mother    Colon polyps Maternal Aunt    Cancer Maternal Aunt        colon   Breast cancer Maternal Grandmother    Breast cancer Other        m great GM   Social History   Socioeconomic History   Marital status: Married    Spouse name: Not on file   Number of children: Not on file   Years of education: Not on file   Highest education level: Not on file  Occupational History   Not on file  Tobacco Use   Smoking status: Never   Smokeless tobacco: Never  Vaping Use   Vaping status: Never Used  Substance and Sexual Activity   Alcohol use: No    Alcohol/week: 0.0 standard drinks of alcohol   Drug use: No   Sexual activity: Yes    Partners: Male  Other Topics Concern   Not on file  Social History Narrative   Married: 19 yrs   Regular exercise: walk   Caffeine use: none   Social Drivers of Corporate Investment Banker Strain: Low Risk  (01/10/2024)   Overall Financial Resource Strain (CARDIA)    Difficulty of Paying Living Expenses: Not hard at all  Food Insecurity: No Food Insecurity (01/10/2024)   Hunger Vital Sign    Worried About Running Out of Food in the Last Year: Never true    Ran Out of Food in the Last Year: Never true  Transportation Needs: No Transportation Needs (01/10/2024)   PRAPARE - Administrator, Civil Service (Medical): No    Lack of Transportation (Non-Medical): No  Physical Activity: Sufficiently Active (01/10/2024)   Exercise Vital Sign    Days of Exercise per Week: 5 days    Minutes of Exercise per Session: 30 min  Stress: No  Stress Concern Present (01/10/2024)   [employer] of Occupational Health - Occupational Stress Questionnaire    Feeling of Stress : Only a little  Social Connections: Socially Integrated (01/10/2024)   Social Connection and Isolation Panel [NHANES]    Frequency of Communication with Friends and Family: More than three times a week    Frequency of Social Gatherings with Friends and Family: More than three times a week    Attends Religious Services: More than 4 times per year    Active Member of Clubs or Organizations: Yes  Attends Engineer, Structural: More than 4 times per year    Marital Status: Married    Tobacco Counseling Counseling given: Not Answered   Clinical Intake:  Pre-visit preparation completed: Yes  Pain : 0-10 Pain Score: 5  Pain Type: Chronic pain Pain Location: Knee Pain Orientation: Left Pain Descriptors / Indicators: Aching Pain Onset: More than a month ago Pain Frequency: Intermittent     BMI - recorded: 26.16 Nutritional Status: BMI 25 -29 Overweight Nutritional Risks: None Diabetes: No  How often do you need to have someone help you when you read instructions, pamphlets, or other written materials from your doctor or pharmacy?: 1 - Never  Interpreter Needed?: No  Information entered by :: R. Freddie Dymek LPN   Activities of Daily Living    01/10/2024    3:42 PM  In your present state of health, do you have any difficulty performing the following activities:  Hearing? 0  Vision? 0  Comment glasses  Difficulty concentrating or making decisions? 1  Walking or climbing stairs? 1  Dressing or bathing? 0  Doing errands, shopping? 0  Preparing Food and eating ? N  Using the Toilet? N  In the past six months, have you accidently leaked urine? Y  Do you have problems with loss of bowel control? N  Managing your Medications? N  Managing your Finances? N  Housekeeping or managing your Housekeeping? N    Patient Care Team: Marylynn Verneita CROME, MD as PCP - General (Internal Medicine) Marylynn Verneita CROME, MD (Internal Medicine) Dessa Reyes ORN, MD (General Surgery)  Indicate any recent Medical Services you may have received from other than [facility] providers in the past year (date may be approximate).     Assessment:   This is a routine wellness examination for Katelyn Friedman.  Hearing/Vision screen Hearing Screening - Comments:: No issues Vision Screening - Comments:: glasses   Goals Addressed             This Visit's Progress    Patient Stated       Wants to have knee surgery so that she can be more mobile       Depression Screen    01/10/2024    3:51 PM 12/01/2022    3:45 PM 07/27/2022   10:10 AM 12/25/2021    4:39 PM 04/14/2021    4:04 PM 01/15/2021    3:48 PM 12/25/2020    1:15 PM  PHQ 2/9 Scores  PHQ - 2 Score 1 0 0 6 2 0 0  PHQ- 9 Score 4   15 4       Fall Risk    01/10/2024    3:46 PM 07/11/2023    3:15 PM 12/01/2022    3:44 PM 10/06/2022    4:20 PM 07/27/2022   10:10 AM  Fall Risk   Falls in the past year? 0 1 1 0 0  Number falls in past yr: 0 1 1    Injury with Fall? 0 0 1    Risk for fall due to : No Fall Risks;History of fall(s);Impaired balance/gait History of fall(s)  No Fall Risks No Fall Risks  Follow up Falls prevention discussed;Falls evaluation completed Falls evaluation completed Falls evaluation completed;Falls prevention discussed Falls evaluation completed Falls evaluation completed    MEDICARE RISK AT HOME: Medicare Risk at Home Any stairs in or around the home?: Yes If so, are there any without handrails?: No Home free of loose throw rugs in walkways, pet beds, electrical cords,  etc?: Yes Adequate lighting in your home to reduce risk of falls?: Yes Life alert?: No Use of a cane, walker or w/c?: No Grab bars in the bathroom?: Yes Shower chair or bench in shower?: No Elevated toilet seat or a handicapped toilet?: No   Cognitive Function:    12/18/2018   10:18 AM 10/26/2017     8:33 AM  MMSE - Mini Mental State Exam  Orientation to time 5 5  Orientation to Place 5 5  Registration 3 3  Attention/ Calculation 5 5  Recall 3 3  Language- name 2 objects 2 2  Language- repeat 1 1  Language- follow 3 step command 3 3  Language- read & follow direction 1 1  Write a sentence 1 1  Copy design 1 1  Total score 30 30        01/10/2024    3:57 PM 12/01/2022    3:48 PM 12/25/2020    1:17 PM 12/24/2019   11:52 AM  6CIT Screen  What Year? 0 points 0 points 0 points 0 points  What month? 0 points 0 points 0 points 0 points  What time? 0 points 0 points 0 points 0 points  Count back from 20 0 points 0 points 0 points 0 points  Months in reverse 0 points 0 points 0 points 0 points  Repeat phrase 0 points 0 points 0 points 0 points  Total Score 0 points 0 points 0 points 0 points    Immunizations Immunization History  Administered Date(s) Administered   Fluad Quad(high Dose 65+) 08/23/2019, 09/15/2020, 10/30/2021, 10/06/2022   Influenza, High Dose Seasonal PF 09/26/2018   PFIZER(Purple Top)SARS-COV-2 Vaccination 01/19/2020, 01/30/2020, 08/27/2020, 08/07/2021   Pfizer Covid-19 Vaccine Bivalent Booster 36yrs & up 11/06/2021   Pneumococcal Conjugate-13 11/09/2017   Pneumococcal Polysaccharide-23 03/02/2013, 01/30/2019   Td 08/04/2022   Tdap 04/10/2014   Zoster Recombinant(Shingrix) 08/04/2022    TDAP status: Up to date  Flu Vaccine status: Due, Education has been provided regarding the importance of this vaccine. Advised may receive this vaccine at local pharmacy or Health Dept. Aware to provide a copy of the vaccination record if obtained from local pharmacy or Health Dept. Verbalized acceptance and understanding.  Pneumococcal vaccine status: Up to date  Covid-19 vaccine status: Information provided on how to obtain vaccines.   Qualifies for Shingles Vaccine? Yes   Zostavax completed No   Shingrix Completed?: No.    Education has been provided regarding  the importance of this vaccine. Patient has been advised to call insurance company to determine out of pocket expense if they have not yet received this vaccine. Advised may also receive vaccine at local pharmacy or Health Dept. Verbalized acceptance and understanding.  Screening Tests Health Maintenance  Topic Date Due   Zoster Vaccines- Shingrix (2 of 2) 09/29/2022   INFLUENZA VACCINE  07/28/2023   COVID-19 Vaccine (6 - 2024-25 season) 08/28/2023   Medicare Annual Wellness (AWV)  12/02/2023   Colonoscopy  05/07/2024   MAMMOGRAM  04/28/2024   DTaP/Tdap/Td (3 - Td or Tdap) 08/04/2032   Pneumonia Vaccine 88+ Years old  Completed   DEXA SCAN  Completed   Hepatitis C Screening  Completed   HPV VACCINES  Aged Out    Health Maintenance  Health Maintenance Due  Topic Date Due   Zoster Vaccines- Shingrix (2 of 2) 09/29/2022   INFLUENZA VACCINE  07/28/2023   COVID-19 Vaccine (6 - 2024-25 season) 08/28/2023   Medicare Annual Wellness (AWV)  12/02/2023  Colonoscopy  05/07/2024    Colorectal cancer screening: Type of screening: Colonoscopy. Completed 04/2014. Repeat every 10 years  Mammogram status: Completed 04/2023. Repeat every year  Bone Density status: Completed 06/2021. Results reflect: Bone density results: OSTEOPENIA. Repeat every 2 years.  Lung Cancer Screening: (Low Dose CT Chest recommended if Age 77-80 years, 20 pack-year currently smoking OR have quit w/in 15years.) does not qualify.  Additional Screening:  Hepatitis C Screening: does qualify; Completed 09/2017  Vision Screening: Recommended annual ophthalmology exams for early detection of glaucoma and other disorders of the eye. Is the patient up to date with their annual eye exam?  No  Who is the provider or what is the name of the office in which the patient attends annual eye exams? [facility]  If pt is not established with a provider, would they like to be referred to a provider to establish care? No .   Dental  Screening: Recommended annual dental exams for proper oral hygiene   Community Resource Referral / Chronic Care Management: CRR required this visit?  No   CCM required this visit?  No     Plan:     I have personally reviewed and noted the following in the patient's chart:   Medical and social history Use of alcohol, tobacco or illicit drugs  Current medications and supplements including opioid prescriptions. Patient is not currently taking opioid prescriptions. Functional ability and status Nutritional status Physical activity Advanced directives List of other physicians Hospitalizations, surgeries, and ER visits in previous 12 months Vitals Screenings to include cognitive, depression, and falls Referrals and appointments  In addition, I have reviewed and discussed with patient certain preventive protocols, quality metrics, and best practice recommendations. A written personalized care plan for preventive services as well as general preventive health recommendations were provided to patient.     Angeline Fredericks, LPN   8/85/7974   After Visit Summary: (MyChart) Due to this being a telephonic visit, the after visit summary with patients personalized plan was offered to patient via MyChart   Nurse Notes: None

## 2024-01-10 NOTE — Patient Instructions (Signed)
 Ms. Katelyn Friedman , Thank you for taking time to come for your Medicare Wellness Visit. I appreciate your ongoing commitment to your health goals. Please review the following plan we discussed and let me know if I can assist you in the future.   Referrals/Orders/Follow-Ups/Clinician Recommendations: Remember to keep your appointment with Dr. Marylynn and bring the dates of your vaccines.  This is a list of the screening recommended for you and due dates:  Health Maintenance  Topic Date Due   Zoster (Shingles) Vaccine (2 of 2) 09/29/2022   Flu Shot  07/28/2023   COVID-19 Vaccine (6 - 2024-25 season) 08/28/2023   Colon Cancer Screening  05/07/2024   Mammogram  04/28/2024   Medicare Annual Wellness Visit  01/09/2025   DTaP/Tdap/Td vaccine (3 - Td or Tdap) 08/04/2032   Pneumonia Vaccine  Completed   DEXA scan (bone density measurement)  Completed   Hepatitis C Screening  Completed   HPV Vaccine  Aged Out    Advanced directives: (Declined) Advance directive discussed with you today. Even though you declined this today, please call our office should you change your mind, and we can give you the proper paperwork for you to fill out.  Next Medicare Annual Wellness Visit scheduled for next year: Yes 01/14/25 @ 3:00

## 2024-01-16 ENCOUNTER — Telehealth: Payer: Self-pay

## 2024-01-16 ENCOUNTER — Ambulatory Visit: Payer: PPO | Admitting: Internal Medicine

## 2024-01-16 MED ORDER — LEVOTHYROXINE SODIUM 50 MCG PO TABS: ORAL_TABLET | ORAL | 0 refills | Status: DC

## 2024-01-16 NOTE — Telephone Encounter (Signed)
Prescription Request  01/16/2024  LOV: Visit date not found  What is the name of the medication or equipment? levothyroxine (SYNTHROID) 50 MCG tablet  Have you contacted your pharmacy to request a refill? No   Which pharmacy would you like this sent to?  [facility] [address] [place] - [address] [place] [address] Phone: [phone] Fax: [phone]    Patient notified that their request is being sent to the clinical staff for review and that they should receive a response within 2 business days.   Please advise at Mobile [phone] (mobile)  I called patient to reschedule her appointment with Dr. Duncan Dull and patient states she took her last pill this morning, so she will need enough medication to get her through to her appointment with Dr. Darrick Huntsman on 01/24/2024.  Patient states she needs clarification on how much of this medication she should take.  Patient states she was taking one pill each day, then Dr. Darrick Huntsman checked her thyroid and changed it to one pill daily for six days, then two pills on the seventh day.

## 2024-01-16 NOTE — Telephone Encounter (Signed)
Patient states she is confused about what she should be taking and thinks she may not be taking it right.  Patient states she is not sure if she should be taking one and a half pills a week or two pills per week.

## 2024-01-16 NOTE — Telephone Encounter (Signed)
LMTCB. Pt needs to continue taking the levothyroxine as she is one tablet daily and on the 7th day she takes two tablets. Reiflled medicaiton for pt.

## 2024-01-18 ENCOUNTER — Ambulatory Visit: Payer: Self-pay | Admitting: Internal Medicine

## 2024-01-18 ENCOUNTER — Other Ambulatory Visit: Payer: Self-pay | Admitting: Family

## 2024-01-18 NOTE — Telephone Encounter (Signed)
Copied From CRM [id number]. Reason for Triage: Patient called in saying she has a possible virus, she has been sick for 2 days with symptoms of nausea, vomiting and diarrhea. Wants to know if there is anything for her to take and is requesting a call from a Nurse.   Chief Complaint: Nausea, vomiting, Diarrhea Symptoms: 8-10 water stools, and 4 vomiting within past 24 hours Frequency: comes and goes Pertinent Negatives: Patient denies fever Disposition: [] ED /[x] Urgent Care (no appt availability in office) / [] Appointment(In office/virtual)/ []  [facility]/ [] Home Care/ [] Refused Recommended Disposition /[] [place] [facility]/ []  Follow-up with PCP Additional Notes: Patient reports N/V/D since yesterday, reports multiple episode./ States that she's only been able to tolerate some 7up and sips of water, states that her lips are a little dry. Rn advises patient to be seen wih next 4 hours. Pt unable to make the appts offered due to transportation and time. RN advising that patient goes UC today. Pt is agreeable. States that she will have her husband take her to a UC when he returns home.   Reason for Disposition  [1] SEVERE diarrhea (e.g., 7 or more times / day more than normal) AND [2] age > 60 years  Answer Assessment - Initial Assessment Questions 1. DIARRHEA SEVERITY: "How bad is the diarrhea?" "How many more stools have you had in the past 24 hours than normal?"    - NO DIARRHEA (SCALE 0)   - MILD (SCALE 1-3): Few loose or mushy BMs; increase of 1-3 stools over normal daily number of stools; mild increase in ostomy output.   -  MODERATE (SCALE 4-7): Increase of 4-6 stools daily over normal; moderate increase in ostomy output.   -  SEVERE (SCALE 8-10; OR "WORST POSSIBLE"): Increase of 7 or more stools daily over normal; moderate increase in ostomy output; incontinence.      Severe, 10 episodes  2. ONSET: "When did the diarrhea begin?"      Started yesterday   3. BM CONSISTENCY:  "How loose or watery is the diarrhea?"      Watery diarrhea  4. VOMITING: "Are you also vomiting?" If Yes, ask: "How many times in the past 24 hours?"      4 times within 24 hours  5. ABDOMEN PAIN: "Are you having any abdomen pain?" If Yes, ask: "What does it feel like?" (e.g., crampy, dull, intermittent, constant)      Crampy feeling following by the diarrhea. Comes and goes  6. ABDOMEN PAIN SEVERITY: If present, ask: "How bad is the pain?"  (e.g., Scale 1-10; mild, moderate, or severe)   - MILD (1-3): doesn't interfere with normal activities, abdomen soft and not tender to touch    - MODERATE (4-7): interferes with normal activities or awakens from sleep, abdomen tender to touch    - SEVERE (8-10): excruciating pain, doubled over, unable to do any normal activities       Moderate pain  7. ORAL INTAKE: If vomiting, "Have you been able to drink liquids?" "How much liquids have you had in the past 24 hours?"     Has only been able to drink a 7up and water  8. HYDRATION: "Any signs of dehydration?" (e.g., dry mouth [not just dry lips], too weak to stand, dizziness, new weight loss) "When did you last urinate?"     Lips are a little dry, but overall does not feel dehydrated  9. EXPOSURE: "Have you traveled to a foreign country recently?" "Have you been  exposed to anyone with diarrhea?" "Could you have eaten any food that was spoiled?"     Unsure of exposure. Husband had diarrhea  10. ANTIBIOTIC USE: "Are you taking antibiotics now or have you taken antibiotics in the past 2 months?"       No  11. OTHER SYMPTOMS: "Do you have any other symptoms?" (e.g., fever, blood in stool)       No fever  12. PREGNANCY: "Is there any chance you are pregnant?" "When was your last menstrual period?"       no  Protocols used: [facility]

## 2024-01-18 NOTE — Telephone Encounter (Signed)
FYI...Pt is going to urgent care. 

## 2024-01-22 ENCOUNTER — Other Ambulatory Visit: Payer: Self-pay | Admitting: Internal Medicine

## 2024-01-24 ENCOUNTER — Encounter: Payer: Self-pay | Admitting: Internal Medicine

## 2024-01-24 ENCOUNTER — Ambulatory Visit (INDEPENDENT_AMBULATORY_CARE_PROVIDER_SITE_OTHER): Payer: PPO | Admitting: Internal Medicine

## 2024-01-24 VITALS — BP 122/70 | HR 61 | Ht 62.0 in | Wt 137.4 lb

## 2024-01-24 DIAGNOSIS — I700 Atherosclerosis of aorta: Secondary | ICD-10-CM

## 2024-01-24 DIAGNOSIS — E663 Overweight: Secondary | ICD-10-CM

## 2024-01-24 DIAGNOSIS — F411 Generalized anxiety disorder: Secondary | ICD-10-CM | POA: Diagnosis not present

## 2024-01-24 DIAGNOSIS — R5383 Other fatigue: Secondary | ICD-10-CM | POA: Insufficient documentation

## 2024-01-24 DIAGNOSIS — E890 Postprocedural hypothyroidism: Secondary | ICD-10-CM

## 2024-01-24 MED ORDER — ATORVASTATIN CALCIUM 20 MG PO TABS: 20.0000 mg | ORAL_TABLET | Freq: Every day | ORAL | 1 refills | Status: DC

## 2024-01-24 NOTE — Patient Instructions (Signed)
Congratulations !  Your weight is perfect!    I will adjust your thyroid dose to make  your thyroid more active  (if there is room)  But your fatigue may be from your workload  If you need trazodone to help you sleep,  send me a message
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
## 2024-01-24 NOTE — Telephone Encounter (Signed)
Pt is aware and gave a verbal understanding.

## 2024-01-24 NOTE — Assessment & Plan Note (Addendum)
Resolved with intentional 16 lb weight loss .  I have congratulated her in reduction of   BMI and encouraged  maintenance of weight thorugh daily exercise and prudent  diet.
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
## 2024-01-24 NOTE — Assessment & Plan Note (Signed)
Managing symptoms without medications per patient preference. She has been having difficulty maintaining sleep but defers medication for now.

## 2024-01-24 NOTE — Assessment & Plan Note (Signed)
Seen on previous  CT .  she is tolerating  Continued use of  statin therapy given documented evidence of moderate  atherosclerosis in the aorta

## 2024-01-24 NOTE — Assessment & Plan Note (Addendum)
Thyroid was borderline underactive on 50 x 6..  75 x 1  regimen .   Regimen was changed to Increase weekly dose to 50 mcg  x 6,  100  mcg x 1 (Increase the dose on day 7 to 2 tablets,  continue one tablet all other days ) and is more underactive.  Increase dose to 75 mcg daily and recheck in  6 weeks   . Lab Results  Component Value Date   TSH 8.36 (H) 01/25/2024

## 2024-01-25 ENCOUNTER — Other Ambulatory Visit (INDEPENDENT_AMBULATORY_CARE_PROVIDER_SITE_OTHER): Payer: PPO

## 2024-01-25 DIAGNOSIS — E890 Postprocedural hypothyroidism: Secondary | ICD-10-CM

## 2024-01-26 LAB — TSH: TSH: 8.36 u[IU]/mL — ABNORMAL HIGH (ref 0.35–5.50)

## 2024-01-28 ENCOUNTER — Encounter: Payer: Self-pay | Admitting: Internal Medicine

## 2024-01-28 MED ORDER — LEVOTHYROXINE SODIUM 75 MCG PO TABS: 75.0000 ug | ORAL_TABLET | Freq: Every day | ORAL | 1 refills | Status: DC

## 2024-01-28 NOTE — Addendum Note (Signed)
Addended by: Sherlene Shams on: 01/28/2024 06:23 PM   Modules accepted: Orders

## 2024-01-31 ENCOUNTER — Other Ambulatory Visit: Payer: PPO

## 2024-02-28 ENCOUNTER — Ambulatory Visit: Payer: PPO | Admitting: Podiatry

## 2024-04-06 ENCOUNTER — Other Ambulatory Visit: Payer: Self-pay | Admitting: Family

## 2024-04-06 ENCOUNTER — Encounter: Payer: Self-pay | Admitting: Internal Medicine

## 2024-04-06 ENCOUNTER — Other Ambulatory Visit: Payer: Self-pay | Admitting: Internal Medicine

## 2024-04-06 DIAGNOSIS — B001 Herpesviral vesicular dermatitis: Secondary | ICD-10-CM

## 2024-04-06 NOTE — Telephone Encounter (Signed)
 Is it okay to refuse request. Pt is currently taking 75 mcg.

## 2024-04-11 ENCOUNTER — Other Ambulatory Visit: Payer: Self-pay | Admitting: Internal Medicine

## 2024-04-11 MED ORDER — LEVOTHYROXINE SODIUM 75 MCG PO TABS: 75.0000 ug | ORAL_TABLET | Freq: Every day | ORAL | 0 refills | Status: DC

## 2024-04-11 NOTE — Telephone Encounter (Signed)
 Copied from CRM [id number]. Topic: Clinical - Medication Refill >> Apr 11, 2024  8:28 AM Jenice Mitts wrote: Most Recent Primary Care Visit:  Provider: [facility]  Department: [facility]  Visit Type: LAB VISIT  Date: 01/25/2024  Medication: levothyroxine (SYNTHROID) 75 MCG tablet  Has the patient contacted their pharmacy? Yes (Agent: If no, request that the patient contact the pharmacy for the refill. If patient does not wish to contact the pharmacy document the reason why and proceed with request.) (Agent: If yes, when and what did the pharmacy advise?)  Is this the correct pharmacy for this prescription? Yes If no, delete pharmacy and type the correct one.  This is the patient's preferred pharmacy:  [facility] [address], [place] - [address] [place] [address] Phone: [phone] Fax: [phone]    Has the prescription been filled recently? No  Is the patient out of the medication? Yes  Has the patient been seen for an appointment in the last year OR does the patient have an upcoming appointment? Yes  Can we respond through MyChart? Yes  Agent: Please be advised that Rx refills may take up to 3 business days. We ask that you follow-up with your pharmacy.

## 2024-06-06 ENCOUNTER — Ambulatory Visit

## 2024-06-06 ENCOUNTER — Ambulatory Visit (INDEPENDENT_AMBULATORY_CARE_PROVIDER_SITE_OTHER)

## 2024-06-06 DIAGNOSIS — Z111 Encounter for screening for respiratory tuberculosis: Secondary | ICD-10-CM | POA: Diagnosis not present

## 2024-06-06 NOTE — Progress Notes (Signed)
 PPD Placement note Katelyn Friedman, 73 y.o. female is here today for placement of PPD test Reason for PPD test: work Pt taken PPD test before: yes Verified in allergy area and with patient that they are not allergic to the products PPD is made of (Phenol or Tween). Yes Is patient taking any oral or IV steroid medication now or have they taken it in the last month? no Has the patient ever received the BCG vaccine?: no Has the patient been in recent contact with anyone known or suspected of having active TB disease?: no      Date of exposure (if applicable): na      Name of person they were exposed to (if applicable): na Patient's Country of origin?: na O: Alert and oriented in NAD. P:  PPD placed on 06/06/2024.  Patient advised to return for reading within 48-72 hours.

## 2024-06-08 ENCOUNTER — Ambulatory Visit

## 2024-06-08 NOTE — Progress Notes (Cosign Needed Addendum)
PPD Reading Note PPD read and results entered in EpicCare. Result: 0 mm induration. Interpretation: Negative Allergic reaction: No  

## 2024-06-09 ENCOUNTER — Ambulatory Visit: Payer: Self-pay | Admitting: Internal Medicine

## 2024-06-15 ENCOUNTER — Ambulatory Visit (INDEPENDENT_AMBULATORY_CARE_PROVIDER_SITE_OTHER): Admitting: Family

## 2024-06-15 ENCOUNTER — Encounter: Payer: Self-pay | Admitting: Family

## 2024-06-15 VITALS — BP 122/80 | HR 65 | Temp 97.7°F | Ht 62.0 in | Wt 136.2 lb

## 2024-06-15 DIAGNOSIS — Z1231 Encounter for screening mammogram for malignant neoplasm of breast: Secondary | ICD-10-CM

## 2024-06-15 DIAGNOSIS — E890 Postprocedural hypothyroidism: Secondary | ICD-10-CM

## 2024-06-15 DIAGNOSIS — I700 Atherosclerosis of aorta: Secondary | ICD-10-CM | POA: Diagnosis not present

## 2024-06-15 DIAGNOSIS — Z1211 Encounter for screening for malignant neoplasm of colon: Secondary | ICD-10-CM | POA: Diagnosis not present

## 2024-06-15 DIAGNOSIS — Z780 Asymptomatic menopausal state: Secondary | ICD-10-CM | POA: Diagnosis not present

## 2024-06-15 NOTE — Assessment & Plan Note (Signed)
 Pending repeat TSH. Continue synthroid  75 mcg daily.  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

## 2024-06-15 NOTE — Progress Notes (Signed)
 Assessment & Plan:  Abdominal aortic atherosclerosis (HCC) Assessment & Plan: Chronic, presume stable.  Continue lipitor 20mg  every day. Pending lipid panel.   Orders: -     Lipid panel; Future -     Comprehensive metabolic panel with GFR; Future  Hypothyroidism following radioiodine therapy Assessment & Plan: Pending repeat TSH. Continue synthroid  75 mcg daily.  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   Risks, benefits, and alternatives of the medications and treatment plan prescribed today were discussed, and patient expressed understanding.   Education regarding symptom management and diagnosis given to patient on AVS either electronically or printed.  Return for Fasting labs in 2-3 weeks.  Bascom Bossier, FNP  Subjective:    Patient ID: Katelyn Friedman, female    DOB: 28-Mar-1951, 73 y.o.   MRN: [id number]  CC: Katelyn Friedman is a 73 y.o. female who presents today for medication follow up.   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  TB screen completed, negative    Due MM, colonoscopy, DEXA  Compliant with synthroid  75 mcg  Denies CP, sob    Allergies: Influenza vaccines, Amoxicillin , Codeine, and Mobic  [meloxicam ] Current Outpatient Medications on File Prior to Visit  Medication Sig Dispense Refill   acetaminophen  (TYLENOL ) 500 MG tablet Take 500-1,000 mg by mouth every 6 (six) hours as needed (for pain/headaches.).     acyclovir  (ZOVIRAX ) 400 MG tablet TAKE 1 TABLET BY MOUTH ONCE DAILY AS NEEDED FOR  COLD  SORE 30 tablet 0   Ascorbic Acid  (VITAMIN C) 1000 MG tablet Take 1,000 mg by mouth daily.     aspirin  81 MG chewable tablet 1 tablet     atorvastatin  (LIPITOR) 20 MG tablet Take 1 tablet (20 mg total) by mouth daily. 90 tablet 1   Cholecalciferol (VITAMIN D ) 2000 units tablet Take 2,000 Units by mouth daily.     Cyanocobalamin (VITAMIN B 12 PO) Take by mouth daily.     levothyroxine  (SYNTHROID ) 75 MCG tablet Take 1 tablet (75 mcg total) by mouth daily before breakfast. 90 tablet 0   meclizine  (ANTIVERT ) 12.5 MG tablet Take 1 tablet (12.5 mg total) by mouth 3 (three) times daily as needed (vertigo). 30 tablet 0   No current facility-administered medications on file prior to visit.    Review of Systems  Constitutional:  Negative for chills and fever.  Respiratory:  Negative for cough.   Cardiovascular:  Negative for chest pain and palpitations.  Gastrointestinal:  Negative for nausea and vomiting.      Objective:    BP 122/80   Pulse 65   Temp 97.7 F (36.5 C) (Oral)   Ht 5' 2 (1.575 m)   Wt 136 lb 3.2 oz (61.8 kg)   SpO2 96%   BMI 24.91 kg/m  BP Readings from Last 3 Encounters:  06/15/24 122/80  01/24/24 122/70  07/11/23 130/78   Wt Readings from Last 3 Encounters:  06/15/24 136 lb 3.2 oz (61.8 kg)  01/24/24 137 lb  6.4 oz (62.3 kg)  01/10/24 143 lb (64.9 kg)      06/15/2024   11:28 AM 01/10/2024    3:51 PM 12/01/2022    3:45 PM  Depression screen PHQ 2/9  Decreased Interest 0 0 0  Down, Depressed, Hopeless 0 1 0  PHQ - 2 Score 0 1 0  Altered sleeping  2   Tired, decreased energy  1   Change in appetite  0   Feeling bad or failure about yourself   0   Trouble concentrating  0   Moving slowly or fidgety/restless  0   Suicidal thoughts  0   PHQ-9 Score  4   Difficult doing work/chores  Somewhat difficult      Physical Exam Vitals reviewed.  Constitutional:      Appearance: She is well-developed.   Eyes:     Conjunctiva/sclera: Conjunctivae normal.    Cardiovascular:     Rate and  Rhythm: Normal rate and regular rhythm.     Pulses: Normal pulses.     Heart sounds: Normal heart sounds.  Pulmonary:     Effort: Pulmonary effort is normal.     Breath sounds: Normal breath sounds. No wheezing, rhonchi or rales.   Skin:    General: Skin is warm and dry.   Neurological:     Mental Status: She is alert.   Psychiatric:        Speech: Speech normal.        Behavior: Behavior normal.        Thought Content: Thought content normal.

## 2024-06-15 NOTE — Assessment & Plan Note (Signed)
 Chronic, presume stable.  Continue lipitor 20mg  every day. Pending lipid panel.

## 2024-06-15 NOTE — Patient Instructions (Signed)
 Please call  and schedule your 3D mammogram and /or bone density scan as we discussed.   [facility]  ( new location in 2023)  [address], [place], [place] [address]  [place], [place]  [phone]   Referral for colonoscopy  Let us  know if you dont hear back within a week in regards to an appointment being scheduled.   So that you are aware, if you are [facility] MyChart user , please pay attention to your MyChart messages as you may receive a MyChart message with a phone number to call and schedule this test/appointment own your own from our referral coordinator. This is a new process so I do not want you to miss this message.  If you are not a MyChart user, you will receive a phone call.    You need 2/2 shingrex vaccine at local pharmacy

## 2024-06-27 ENCOUNTER — Other Ambulatory Visit: Payer: Self-pay | Admitting: Family

## 2024-06-27 ENCOUNTER — Other Ambulatory Visit: Payer: Self-pay | Admitting: Internal Medicine

## 2024-06-27 DIAGNOSIS — B001 Herpesviral vesicular dermatitis: Secondary | ICD-10-CM

## 2024-06-27 NOTE — Telephone Encounter (Signed)
 Copied from CRM [id number]. Topic: Clinical - Medication Refill >> Jun 27, 2024  9:53 AM Franky GRADE wrote: Medication: acyclovir  (ZOVIRAX ) 400 MG tablet [[id number]]  Has the patient contacted their pharmacy? No, patient is going out of town on 07/06/2024 and wanted to make sure she got the refill before then.  (Agent: If no, request that the patient contact the pharmacy for the refill. If patient does not wish to contact the pharmacy document the reason why and proceed with request.) (Agent: If yes, when and what did the pharmacy advise?)  This is the patient's preferred pharmacy:  [facility] [address], [place] - [address] [place] [address] Phone: [phone] Fax: [phone]   Is this the correct pharmacy for this prescription? Yes If no, delete pharmacy and type the correct one.   Has the prescription been filled recently? No  Is the patient out of the medication? Yes  Has the patient been seen for an appointment in the last year OR does the patient have an upcoming appointment? Yes  Can we respond through MyChart? Yes  Agent: Please be advised that Rx refills may take up to 3 business days. We ask that you follow-up with your pharmacy.

## 2024-07-13 ENCOUNTER — Encounter: Payer: Self-pay | Admitting: Advanced Practice Midwife

## 2024-07-17 ENCOUNTER — Ambulatory Visit: Admitting: Podiatry

## 2024-07-17 DIAGNOSIS — Q828 Other specified congenital malformations of skin: Secondary | ICD-10-CM | POA: Diagnosis not present

## 2024-07-17 NOTE — Progress Notes (Signed)
 Subjective:  Patient ID: Katelyn Friedman, female    DOB: Aug 26, 1951,  MRN: [id number]  Chief Complaint  Patient presents with   Callouses    Bilateral callus     73 y.o. female presents with the above complaint.  Patient presents with bilateral submetatarsal 5 porokeratotic lesion painful to touch is progressive and worse worse with ambulation for pressure patient would like to discuss treatment options for this pain scale 7 out of 10 dull aching nature.  She states it hurts to walk on hard surfaces.  Has been going on for quite some time   Review of Systems: Negative except as noted in the HPI. Denies N/V/F/Ch.  Past Medical History:  Diagnosis Date   Allergic rhinitis    Arthritis    Family history of adverse reaction to anesthesia    PONV   GERD (gastroesophageal reflux disease)    Herpes simplex    History of colon polyps    IBS (irritable bowel syndrome)    PONV (postoperative nausea and vomiting)    Thyroid  disease    graves disease    Current Outpatient Medications:    acetaminophen  (TYLENOL ) 500 MG tablet, Take 500-1,000 mg by mouth every 6 (six) hours as needed (for pain/headaches.)., Disp: , Rfl:    acyclovir  (ZOVIRAX ) 400 MG tablet, TAKE 1 TABLET BY MOUTH ONCE DAILY AS NEEDED FOR COLD SORE, Disp: 30 tablet, Rfl: 0   Ascorbic Acid (VITAMIN C) 1000 MG tablet, Take 1,000 mg by mouth daily., Disp: , Rfl:    aspirin  81 MG chewable tablet, 1 tablet, Disp: , Rfl:    atorvastatin  (LIPITOR) 20 MG tablet, Take 1 tablet (20 mg total) by mouth daily., Disp: 90 tablet, Rfl: 1   Cholecalciferol (VITAMIN D ) 2000 units tablet, Take 2,000 Units by mouth daily., Disp: , Rfl:    Cyanocobalamin (VITAMIN B 12 PO), Take by mouth daily., Disp: , Rfl:    levothyroxine  (SYNTHROID ) 75 MCG tablet, Take 1 tablet (75 mcg total) by mouth daily before breakfast., Disp: 90 tablet, Rfl: 0   meclizine  (ANTIVERT ) 12.5 MG tablet, Take 1 tablet (12.5 mg total) by mouth 3 (three) times daily as needed  (vertigo)., Disp: 30 tablet, Rfl: 0  Social History   Tobacco Use  Smoking Status Never  Smokeless Tobacco Never    Allergies  Allergen Reactions   Influenza Vaccines Swelling and Other (See Comments)    Redness, Fever   Amoxicillin  Diarrhea   Codeine Nausea Only   Mobic  [Meloxicam ] Itching and Rash    Nausea Vomiting   Objective:  There were no vitals filed for this visit. There is no height or weight on file to calculate BMI. Constitutional Well developed. Well nourished.  Vascular Dorsalis pedis pulses palpable bilaterally. Posterior tibial pulses palpable bilaterally. Capillary refill normal to all digits.  No cyanosis or clubbing noted. Pedal hair growth normal.  Neurologic Normal speech. Oriented to person, place, and time. Epicritic sensation to light touch grossly present bilaterally.  Dermatologic Bilateral submet 5 plantarflexed fifth metatarsal with underlying submetatarsal 5 skin lesion.  Pain on palpation to the lesion.  No pinpoint bleeding noted upon debridement  Orthopedic: Normal joint ROM without pain or crepitus bilaterally. No visible deformities. No bony tenderness.   Radiographs: None Assessment:   1. Porokeratosis    Plan:  Patient was evaluated and treated and all questions answered.  Bilateral submetatarsal 5 porokeratosis - All questions and concerns were discussed with the patient in extensive detail given the amount of pain  that she is experiencing she would benefit from aggressive debridement of the lesion using chisel blade handle the lesion was debrided down to healthy striated tissue.  No complication noted no pinpoint bleeding noted.  No follow-ups on file.

## 2024-07-18 ENCOUNTER — Ambulatory Visit: Payer: Self-pay | Admitting: *Deleted

## 2024-07-18 NOTE — Telephone Encounter (Signed)
 Copied from CRM [id number]. Topic: Clinical - Red Word Triage >> Jul 18, 2024 11:29 AM [place] J wrote: [facility] that prompted transfer to Nurse Triage: Pain that goes from the patient's neck and shoots down to her back.   ----------------------------------------------------------------------- From previous Reason for Contact - Scheduling: Patient/patient representative is calling to schedule an appointment. Refer to attachments for appointment information. Reason for Disposition  [1] MODERATE neck pain (e.g., interferes with normal activities) AND [2] present > 3 days  Answer Assessment - Initial Assessment Questions 1. ONSET: When did the pain begin?      I'm having neck pain that is shooting down the middle of my back.  It's a pinching feeling.   Going on for a month.    2. LOCATION: Where does it hurt?      See above 3. PATTERN Does the pain come and go, or has it been constant since it started?      It comes and goes. 4. SEVERITY: How bad is the pain?  (Scale 0-10; or none or slight stiffness, mild, moderate, severe)      Moderate 5. RADIATION: Does the pain go anywhere else, shoot into your arms?     Down my middle back. 6. CORD SYMPTOMS: Any weakness or numbness of the arms or legs?     Not asked 7. CAUSE: What do you think is causing the neck pain?     I don't know 8. NECK OVERUSE: Any recent activities that involved turning or twisting the neck?     Nothing 9. OTHER SYMPTOMS: Do you have any other symptoms? (e.g., headache, fever, chest pain, difficulty breathing, neck swelling)     No 10. PREGNANCY: Is there any chance you are pregnant? When was your last menstrual period?       N/A  Protocols used: Neck Pain or Stiffness-A-AH   FYI Only or Action Required?: FYI only for provider.  Patient was last seen in primary care on 06/15/2024 by Dineen Rollene MATSU, FNP.  Called Nurse Triage reporting Neck Pain.  Symptoms began about a month  ago.  Interventions attempted: Rest, hydration, or home remedies.  Symptoms are: gradually worsening.  Triage Disposition: See PCP When Office is Open (Within 3 Days)  Patient/caregiver understands and will follow disposition?: YesFYI Only or Action Required?: FYI only for provider.  Patient was last seen in primary care on 06/15/2024 by Dineen Rollene MATSU, FNP.  Called Nurse Triage reporting Neck Pain.that goes down into her upper back.  Symptoms began about a month ago.  Interventions attempted: Rest, hydration, or home remedies.  Symptoms are: gradually worsening.  Triage Disposition: See PCP When Office is Open (Within 3 Days)  Patient/caregiver understands and will follow disposition?: Yes

## 2024-07-18 NOTE — Telephone Encounter (Signed)
 Scheduled to see Dr. Onesimo on 07/23/2024.

## 2024-07-23 ENCOUNTER — Ambulatory Visit: Admitting: Internal Medicine

## 2024-07-27 ENCOUNTER — Ambulatory Visit (INDEPENDENT_AMBULATORY_CARE_PROVIDER_SITE_OTHER): Admitting: Family

## 2024-07-27 ENCOUNTER — Encounter: Payer: Self-pay | Admitting: Family

## 2024-07-27 VITALS — BP 122/76 | HR 67 | Temp 98.2°F | Ht 62.0 in | Wt 136.6 lb

## 2024-07-27 DIAGNOSIS — M542 Cervicalgia: Secondary | ICD-10-CM | POA: Insufficient documentation

## 2024-07-27 MED ORDER — TIZANIDINE HCL 2 MG PO CAPS: 2.0000 mg | ORAL_CAPSULE | Freq: Every evening | ORAL | 0 refills | Status: DC | PRN

## 2024-07-27 NOTE — Assessment & Plan Note (Signed)
 C/w with muscle spasm. Suspect underlying DDD. Pending xray. Provided tizanidine ; counseled on sedating properties. Start tylenol  650mg  BID, ice/heat. Consider PT, she politely declines today.

## 2024-07-27 NOTE — Patient Instructions (Addendum)
 Start tylenol  arthritis 650mg  once in the morning and at night 20 minutes heat or ice 2-3 times per day  Consider physical therapy  Cervical Strain and Sprain Rehab Ask your health care provider which exercises are safe for you. Do exercises exactly as told by your health care provider and adjust them as directed. It is normal to feel mild stretching, pulling, tightness, or discomfort as you do these exercises. Stop right away if you feel sudden pain or your pain gets worse. Do not begin these exercises until told by your health care provider. Stretching and range-of-motion exercises Cervical side bending  Using good posture, sit on a stable chair or stand up. Without moving your shoulders, slowly tilt your left / right ear to your shoulder until you feel a stretch in the neck muscles on the opposite side. You should be looking straight ahead. Hold for __________ seconds. Repeat with the other side of your neck. Repeat __________ times. Complete this exercise __________ times a day. Cervical rotation  Using good posture, sit on a stable chair or stand up. Slowly turn your head to the side as if you are looking over your left / right shoulder. Keep your eyes level with the ground. Stop when you feel a stretch along the side and the back of your neck. Hold for __________ seconds. Repeat this by turning to your other side. Repeat __________ times. Complete this exercise __________ times a day. Thoracic extension and pectoral stretch  Roll a towel or a small blanket so it is about 4 inches (10 cm) in diameter. Lie down on your back on a firm surface. Put the towel in the middle of your back across your spine. It should not be under your shoulder blades. Put your hands behind your head and let your elbows fall out to your sides. Hold for __________ seconds. Repeat __________ times. Complete this exercise __________ times a day. Strengthening exercises Upper cervical flexion  Lie on your  back with a thin pillow behind your head or a small, rolled-up towel under your neck. Gently tuck your chin toward your chest and nod your head down to look toward your feet. Do not lift your head off the pillow. Hold for __________ seconds. Release the tension slowly. Relax your neck muscles completely before you repeat this exercise. Repeat __________ times. Complete this exercise __________ times a day. Cervical extension  Stand about 6 inches (15 cm) away from a wall, with your back facing the wall. Place a soft object, about 6-8 inches (15-20 cm) in diameter, between the back of your head and the wall. A soft object could be a small pillow, a ball, or a folded towel. Gently tilt your head back and press into the soft object. Keep your jaw and forehead relaxed. Hold for __________ seconds. Release the tension slowly. Relax your neck muscles completely before you repeat this exercise. Repeat __________ times. Complete this exercise __________ times a day. Posture and body mechanics Body mechanics refer to the movements and positions of your body while you do your daily activities. Posture is part of body mechanics. Good posture and healthy body mechanics can help to relieve stress in your body's tissues and joints. Good posture means that your spine is in its natural S-curve position (your spine is neutral), your shoulders are pulled back slightly, and your head is not tipped forward. The following are general guidelines for using improved posture and body mechanics in your everyday activities. Sitting  When sitting, keep your  spine neutral and keep your feet flat on the floor. Use a footrest, if needed, and keep your thighs parallel to the floor. Avoid rounding your shoulders. Avoid tilting your head forward. When working at a desk or a computer, keep your desk at a height where your hands are slightly lower than your elbows. Slide your chair under your desk so you are close enough to maintain  good posture. When working at a computer, place your monitor at a height where you are looking straight ahead and you do not have to tilt your head forward or downward to look at the screen. Standing  When standing, keep your spine neutral and keep your feet about hip-width apart. Keep a slight bend in your knees. Your ears, shoulders, and hips should line up. When you do a task in which you stand in one place for a long time, place one foot up on a stable object that is 2-4 inches (5-10 cm) high, such as a footstool. This helps keep your spine neutral. Resting When lying down and resting, avoid positions that are most painful for you. Try to support your neck in a neutral position. You can use a contour pillow or a small rolled-up towel. Your pillow should support your neck but not push on it. This information is not intended to replace advice given to you by your health care provider. Make sure you discuss any questions you have with your health care provider. Document Revised: 04/18/2023 Document Reviewed: 07/05/2022 Elsevier Patient Education  2024 [employer].

## 2024-07-27 NOTE — Progress Notes (Signed)
 Assessment & Plan:  Neck pain Assessment & Plan: C/w with muscle spasm. Suspect underlying DDD. Pending xray. Provided tizanidine ; counseled on sedating properties. Start tylenol  650mg  BID, ice/heat. Consider PT, she politely declines today.   Orders: -     tiZANidine  HCl; Take 1-2 capsules (2-4 mg total) by mouth at bedtime as needed for muscle spasms.  Dispense: 30 capsule; Refill: 0 -     DG Cervical Spine Complete; Future     Return precautions given.   Risks, benefits, and alternatives of the medications and treatment plan prescribed today were discussed, and patient expressed understanding.   Education regarding symptom management and diagnosis given to patient on AVS either electronically or printed.  No follow-ups on file.  Rollene Northern, FNP  Subjective:    Patient ID: Katelyn Friedman, female    DOB: 10-27-1951, 73 y.o.   MRN: [id number]  CC: Katelyn Friedman is a 73 y.o. female who presents today for an acute visit.    HPI: HPI Discussed the use of AI scribe software for clinical note transcription with the patient, who gave verbal consent to proceed.  History of Present Illness    Katelyn Friedman is a 73 year old female who presents with upper back and neck pain.  She has been experiencing neck pain radiating into her shoulders, described as a 'pins' sensation, for approximately one month. The pain has progressively worsened, prompting her visit today. There is no history of specific injury. The pain is not associated with any specific pattern or movement and can occur even when sitting still. It extends from the neck to the top of the shoulders. No vision changes, headaches, fevers, or rash are present.  For pain management, she has been using extra strength Tylenol  500 mg, which provides some relief but does not completely resolve the pain. She also uses a robe for warmth, which helps somewhat. She has a history of right rotator cuff surgery but does  not believe her current symptoms are related to her shoulder. She has not had any imaging studies done for this issue yet. She denies arm weakness and numbness or tingling down the arm.         H/o GERD, herpes simplex No ho ckd  Allergies: Influenza vaccines, Amoxicillin , Codeine, and Mobic  [meloxicam ] Current Outpatient Medications on File Prior to Visit  Medication Sig Dispense Refill   acetaminophen  (TYLENOL ) 500 MG tablet Take 500-1,000 mg by mouth every 6 (six) hours as needed (for pain/headaches.).     acyclovir  (ZOVIRAX ) 400 MG tablet TAKE 1 TABLET BY MOUTH ONCE DAILY AS NEEDED FOR COLD SORE 30 tablet 0   Ascorbic Acid (VITAMIN C) 1000 MG tablet Take 1,000 mg by mouth daily.     aspirin  81 MG chewable tablet 1 tablet     atorvastatin  (LIPITOR) 20 MG tablet Take 1 tablet (20 mg total) by mouth daily. 90 tablet 1   Cholecalciferol (VITAMIN D ) 2000 units tablet Take 2,000 Units by mouth daily.     levothyroxine  (SYNTHROID ) 75 MCG tablet Take 1 tablet (75 mcg total) by mouth daily before breakfast. 90 tablet 0   Cyanocobalamin (VITAMIN B 12 PO) Take by mouth daily. (Patient not taking: Reported on 07/27/2024)     meclizine  (ANTIVERT ) 12.5 MG tablet Take 1 tablet (12.5 mg total) by mouth 3 (three) times daily as needed (vertigo). (Patient not taking: Reported on 07/27/2024) 30 tablet 0   No current facility-administered medications on file prior to visit.  Review of Systems  Constitutional:  Negative for chills and fever.  Eyes:  Negative for visual disturbance.  Respiratory:  Negative for cough.   Cardiovascular:  Negative for chest pain and palpitations.  Gastrointestinal:  Negative for nausea and vomiting.  Musculoskeletal:  Positive for neck pain.  Neurological:  Negative for numbness and headaches.      Objective:    BP 122/76   Pulse 67   Temp 98.2 F (36.8 C) (Oral)   Ht 5' 2 (1.575 m)   Wt 136 lb 9.6 oz (62 kg)   SpO2 95%   BMI 24.98 kg/m   BP Readings from  Last 3 Encounters:  07/27/24 122/76  06/15/24 122/80  01/24/24 122/70   Wt Readings from Last 3 Encounters:  07/27/24 136 lb 9.6 oz (62 kg)  06/15/24 136 lb 3.2 oz (61.8 kg)  01/24/24 137 lb 6.4 oz (62.3 kg)    Physical Exam Vitals reviewed.  Constitutional:      Appearance: She is well-developed.  Eyes:     Conjunctiva/sclera: Conjunctivae normal.  Neck:      Comments: Muscle spasm Pronounced lordosis on exam Cardiovascular:     Rate and Rhythm: Normal rate and regular rhythm.     Pulses: Normal pulses.     Heart sounds: Normal heart sounds.  Pulmonary:     Effort: Pulmonary effort is normal.     Breath sounds: Normal breath sounds. No wheezing, rhonchi or rales.  Musculoskeletal:     Cervical back: No torticollis. Muscular tenderness present. No pain with movement or spinous process tenderness.  Skin:    General: Skin is warm and dry.  Neurological:     Mental Status: She is alert.  Psychiatric:        Speech: Speech normal.        Behavior: Behavior normal.        Thought Content: Thought content normal.

## 2024-08-06 ENCOUNTER — Other Ambulatory Visit (INDEPENDENT_AMBULATORY_CARE_PROVIDER_SITE_OTHER)

## 2024-08-06 ENCOUNTER — Telehealth: Payer: Self-pay

## 2024-08-06 ENCOUNTER — Ambulatory Visit

## 2024-08-06 DIAGNOSIS — I700 Atherosclerosis of aorta: Secondary | ICD-10-CM | POA: Diagnosis not present

## 2024-08-06 DIAGNOSIS — M542 Cervicalgia: Secondary | ICD-10-CM

## 2024-08-06 DIAGNOSIS — E890 Postprocedural hypothyroidism: Secondary | ICD-10-CM

## 2024-08-06 DIAGNOSIS — M47812 Spondylosis without myelopathy or radiculopathy, cervical region: Secondary | ICD-10-CM | POA: Diagnosis not present

## 2024-08-06 LAB — COMPREHENSIVE METABOLIC PANEL WITH GFR
ALT: 10 U/L (ref 0–35)
AST: 14 U/L (ref 0–37)
Albumin: 4.2 g/dL (ref 3.5–5.2)
Alkaline Phosphatase: 50 U/L (ref 39–117)
BUN: 14 mg/dL (ref 6–23)
CO2: 29 meq/L (ref 19–32)
Calcium: 9.2 mg/dL (ref 8.4–10.5)
Chloride: 104 meq/L (ref 96–112)
Creatinine, Ser: 0.88 mg/dL (ref 0.40–1.20)
GFR: 64.88 mL/min (ref >60.00)
Glucose, Bld: 87 mg/dL (ref 70–99)
Potassium: 4.2 meq/L (ref 3.5–5.1)
Sodium: 140 meq/L (ref 135–145)
Total Bilirubin: 1 mg/dL (ref 0.2–1.2)
Total Protein: 6.2 g/dL (ref 6.0–8.3)

## 2024-08-06 LAB — LIPID PANEL
Cholesterol: 132 mg/dL (ref 0–200)
HDL: 59.6 mg/dL (ref >39.00)
LDL Cholesterol: 62 mg/dL (ref 0–99)
NonHDL: 72.17
Total CHOL/HDL Ratio: 2
Triglycerides: 49 mg/dL (ref 0.0–149.0)
VLDL: 9.8 mg/dL (ref 0.0–40.0)

## 2024-08-06 LAB — TSH: TSH: 0.16 u[IU]/mL — ABNORMAL LOW (ref 0.35–5.50)

## 2024-08-06 NOTE — Telephone Encounter (Signed)
 Pt is requesting lab results.

## 2024-08-06 NOTE — Telephone Encounter (Signed)
 Copied from CRM #[id number]. Topic: Clinical - Lab/Test Results >> Aug 06, 2024  3:33 PM Deleta RAMAN wrote: Reason for CRM: please contact patient regarding labs results at  [phone]

## 2024-08-07 ENCOUNTER — Ambulatory Visit: Payer: Self-pay | Admitting: Family

## 2024-08-07 DIAGNOSIS — E890 Postprocedural hypothyroidism: Secondary | ICD-10-CM

## 2024-08-07 NOTE — Assessment & Plan Note (Signed)
 Lab Results  Component Value Date   TSH 0.16 (L) 08/06/2024   Advised to start synthroid  75mcg for FIVE DAYS per week and 37.5mg  ( HALF TABLET) for 2 days per week, 450mcg total per week. See lab result note 08/07/24.

## 2024-08-07 NOTE — Telephone Encounter (Signed)
 Spoke to pt regarding her results see result note

## 2024-08-08 NOTE — Telephone Encounter (Unsigned)
 Copied from CRM #[id number]. Topic: Clinical - Lab/Test Results >> Aug 06, 2024  3:33 PM Deleta RAMAN wrote: Reason for CRM: please contact patient regarding labs results at  [phone] >> Aug 08, 2024  4:07 PM [place] J wrote: Patient is returning a call from [place]. She stated that she is taking 75 mcg currently.

## 2024-08-14 ENCOUNTER — Ambulatory Visit: Admitting: Podiatry

## 2024-08-14 ENCOUNTER — Other Ambulatory Visit: Payer: Self-pay | Admitting: Podiatry

## 2024-08-14 ENCOUNTER — Ambulatory Visit (INDEPENDENT_AMBULATORY_CARE_PROVIDER_SITE_OTHER)

## 2024-08-14 DIAGNOSIS — Q828 Other specified congenital malformations of skin: Secondary | ICD-10-CM

## 2024-08-14 DIAGNOSIS — M216X1 Other acquired deformities of right foot: Secondary | ICD-10-CM

## 2024-08-14 DIAGNOSIS — Z01818 Encounter for other preprocedural examination: Secondary | ICD-10-CM

## 2024-08-14 NOTE — Progress Notes (Signed)
 Subjective:  Patient ID: Katelyn Friedman, female    DOB: Jul 13, 1951,  MRN: [id number]  Chief Complaint  Patient presents with   Callouses    73 y.o. female presents with the above complaint.  Patient presents for follow-up of right submetatarsal 5 porokeratotic lesion with plantarflexed metatarsal she states that is painful to touch it continues to bother her.  She states it has not helped denies any other acute complaints   Review of Systems: Negative except as noted in the HPI. Denies N/V/F/Ch.  Past Medical History:  Diagnosis Date   Allergic rhinitis    Arthritis    Family history of adverse reaction to anesthesia    PONV   GERD (gastroesophageal reflux disease)    Herpes simplex    History of colon polyps    IBS (irritable bowel syndrome)    PONV (postoperative nausea and vomiting)    Thyroid  disease    graves disease    Current Outpatient Medications:    acetaminophen  (TYLENOL ) 500 MG tablet, Take 500-1,000 mg by mouth every 6 (six) hours as needed (for pain/headaches.)., Disp: , Rfl:    acyclovir  (ZOVIRAX ) 400 MG tablet, TAKE 1 TABLET BY MOUTH ONCE DAILY AS NEEDED FOR COLD SORE, Disp: 30 tablet, Rfl: 0   Ascorbic Acid (VITAMIN C) 1000 MG tablet, Take 1,000 mg by mouth daily., Disp: , Rfl:    aspirin  81 MG chewable tablet, 1 tablet, Disp: , Rfl:    atorvastatin  (LIPITOR) 20 MG tablet, Take 1 tablet (20 mg total) by mouth daily., Disp: 90 tablet, Rfl: 1   Cholecalciferol (VITAMIN D ) 2000 units tablet, Take 2,000 Units by mouth daily., Disp: , Rfl:    Cyanocobalamin (VITAMIN B 12 PO), Take by mouth daily. (Patient not taking: Reported on 07/27/2024), Disp: , Rfl:    levothyroxine  (SYNTHROID ) 75 MCG tablet, Take 1 tablet (75 mcg total) by mouth daily before breakfast., Disp: 90 tablet, Rfl: 0   meclizine  (ANTIVERT ) 12.5 MG tablet, Take 1 tablet (12.5 mg total) by mouth 3 (three) times daily as needed (vertigo). (Patient not taking: Reported on 07/27/2024), Disp: 30 tablet, Rfl:  0   tizanidine  (ZANAFLEX ) 2 MG capsule, Take 1-2 capsules (2-4 mg total) by mouth at bedtime as needed for muscle spasms., Disp: 30 capsule, Rfl: 0  Social History   Tobacco Use  Smoking Status Never  Smokeless Tobacco Never    Allergies  Allergen Reactions   Influenza Vaccines Swelling and Other (See Comments)    Redness, Fever   Amoxicillin  Diarrhea   Codeine Nausea Only   Mobic  [Meloxicam ] Itching and Rash    Nausea Vomiting   Objective:  There were no vitals filed for this visit. There is no height or weight on file to calculate BMI. Constitutional Well developed. Well nourished.  Vascular Dorsalis pedis pulses palpable bilaterally. Posterior tibial pulses palpable bilaterally. Capillary refill normal to all digits.  No cyanosis or clubbing noted. Pedal hair growth normal.  Neurologic Normal speech. Oriented to person, place, and time. Epicritic sensation to light touch grossly present bilaterally.  Dermatologic Bilateral submet 5 plantarflexed fifth metatarsal with underlying submetatarsal 5 skin lesion.  Pain on palpation to the lesion.  No pinpoint bleeding noted upon debridement  Orthopedic: Normal joint ROM without pain or crepitus bilaterally. No visible deformities. No bony tenderness.   Radiographs: 3 views of skeletally mature the right foot: Plantarflexed fifth metatarsal noted.Midfoot arthritis noted plantar posterior heel spurring noted pes planovalgus foot structure noted Assessment:   1. Porokeratosis  2. Plantar flexed metatarsal bone of right foot   3. Encounter for preoperative examination for general surgical procedure    Plan:  Patient was evaluated and treated and all questions answered.  Bilateral submetatarsal 5 porokeratosis with underlying plantarflexed fifth metatarsal - All questions and concerns were discussed with the patient in extensive detail given that patient has failed all conservative care including shoe gear modification  padding offloading protecting I believe she will benefit from floating osteotomy of the fifth metatarsal right side.  I discussed my preoperative intra postop plan with the patient in extensive detail she states understand like to proceed with surgery -Informed surgical risk consent was reviewed and read aloud to the patient.  I reviewed the films.  I have discussed my findings with the patient in great detail.  I have discussed all risks including but not limited to infection, stiffness, scarring, limp, disability, deformity, damage to blood vessels and nerves, numbness, poor healing, need for braces, arthritis, chronic pain, amputation, death.  All benefits and realistic expectations discussed in great detail.  I have made no promises as to the outcome.  I have provided realistic expectations.  I have offered the patient a 2nd opinion, which they have declined and assured me they preferred to proceed despite the risks   No follow-ups on file.

## 2024-08-23 ENCOUNTER — Ambulatory Visit: Payer: Self-pay | Admitting: Family

## 2024-08-30 NOTE — Telephone Encounter (Signed)
 I spoke with patient and read Rollene Northern, FNP's message to her.  Patient states she does have metal in her right breast where she had a lump at one time.  Patient states she would be agreeable to having a MRI, but she does need to speak with her insurance company regarding coverage.  Patient states she would like for us  to go ahead with the scheduling process for the MRI.  I let patient know that I will send this message to Rollene Northern, FNP, and someone will contact her regarding scheduling her MRI.

## 2024-09-04 ENCOUNTER — Encounter: Payer: Self-pay | Admitting: Family

## 2024-09-04 ENCOUNTER — Telehealth: Payer: Self-pay | Admitting: Podiatry

## 2024-09-04 NOTE — Telephone Encounter (Signed)
 Received surgical consent form   Called pt and she is to call back once she gets back to her job tomorrow to get the surgery scheduled.

## 2024-09-06 ENCOUNTER — Other Ambulatory Visit

## 2024-09-06 ENCOUNTER — Encounter

## 2024-09-06 DIAGNOSIS — G245 Blepharospasm: Secondary | ICD-10-CM | POA: Diagnosis not present

## 2024-09-10 ENCOUNTER — Other Ambulatory Visit: Payer: Self-pay | Admitting: Family

## 2024-09-10 DIAGNOSIS — M542 Cervicalgia: Secondary | ICD-10-CM

## 2024-09-12 ENCOUNTER — Telehealth: Payer: Self-pay | Admitting: Internal Medicine

## 2024-09-12 NOTE — Telephone Encounter (Signed)
 Spoke to [facility] [place] in Imaging [phone] she states that it is ok for pt to have MRI

## 2024-09-12 NOTE — Telephone Encounter (Signed)
 I spoke with pt to get her scheduled for her MRI, pt states no one called her about the xray result. Pt also would like to a open MRI, I advised pt she can have that done at [facility] imaging and they will call her to schedule.  Please call pt at [phone].  Thank you!

## 2024-09-12 NOTE — Telephone Encounter (Signed)
 I spoke with pt in regards to her x ray results. Pt stated that she does have a clip in her right breast. Pt also stated that she will just do the regular MRI so she does not have to go to [place].

## 2024-09-17 NOTE — Telephone Encounter (Signed)
 Pt has scheduled.

## 2024-09-24 ENCOUNTER — Other Ambulatory Visit

## 2024-09-25 ENCOUNTER — Other Ambulatory Visit (INDEPENDENT_AMBULATORY_CARE_PROVIDER_SITE_OTHER)

## 2024-09-25 DIAGNOSIS — E890 Postprocedural hypothyroidism: Secondary | ICD-10-CM

## 2024-09-26 ENCOUNTER — Other Ambulatory Visit: Payer: Self-pay | Admitting: Internal Medicine

## 2024-09-26 LAB — TSH: TSH: <0.01 u[IU]/mL — ABNORMAL LOW (ref 0.35–5.50)

## 2024-09-27 ENCOUNTER — Other Ambulatory Visit: Payer: Self-pay | Admitting: Internal Medicine

## 2024-09-27 MED ORDER — LEVOTHYROXINE SODIUM 75 MCG PO TABS: 75.0000 ug | ORAL_TABLET | Freq: Every day | ORAL | 0 refills | Status: DC

## 2024-09-27 NOTE — Telephone Encounter (Signed)
 Copied from CRM [id number]. Topic: Clinical - Medication Refill >> Sep 27, 2024  3:58 PM [name] wrote: Medication: levothyroxine  (SYNTHROID ) 75 MCG tablet  Has the patient contacted their pharmacy? Yes (Agent: If no, request that the patient contact the pharmacy for the refill. If patient does not wish to contact the pharmacy document the reason why and proceed with request.) (Agent: If yes, when and what did the pharmacy advise?)  This is the patient's preferred pharmacy:  [facility] [address], [place] - [address] [place] [address] Phone: [phone] Fax: [phone]  Is this the correct pharmacy for this prescription? Yes If no, delete pharmacy and type the correct one.   Has the prescription been filled recently? No  Is the patient out of the medication? No, couple days left   Has the patient been seen for an appointment in the last year OR does the patient have an upcoming appointment? Yes  Can we respond through MyChart? Yes  Agent: Please be advised that Rx refills may take up to 3 business days. We ask that you follow-up with your pharmacy.

## 2024-09-28 ENCOUNTER — Other Ambulatory Visit: Payer: Self-pay

## 2024-09-28 ENCOUNTER — Telehealth: Payer: Self-pay

## 2024-09-28 ENCOUNTER — Other Ambulatory Visit (INDEPENDENT_AMBULATORY_CARE_PROVIDER_SITE_OTHER)

## 2024-09-28 DIAGNOSIS — E0590 Thyrotoxicosis, unspecified without thyrotoxic crisis or storm: Secondary | ICD-10-CM

## 2024-09-28 MED ORDER — ATORVASTATIN CALCIUM 20 MG PO TABS: 20.0000 mg | ORAL_TABLET | Freq: Every day | ORAL | 0 refills | Status: DC

## 2024-09-28 NOTE — Addendum Note (Signed)
 Addended by: TANDA HARVEY D on: 09/28/2024 01:09 PM   Modules accepted: Orders

## 2024-09-28 NOTE — Telephone Encounter (Signed)
 Repeat thyroid  level has resulted

## 2024-09-28 NOTE — Telephone Encounter (Signed)
 Pt is aware and will have the lab work done today.

## 2024-10-01 ENCOUNTER — Other Ambulatory Visit: Payer: Self-pay | Admitting: Internal Medicine

## 2024-10-01 ENCOUNTER — Encounter: Payer: Self-pay | Admitting: Internal Medicine

## 2024-10-01 DIAGNOSIS — E0500 Thyrotoxicosis with diffuse goiter without thyrotoxic crisis or storm: Secondary | ICD-10-CM

## 2024-10-01 LAB — THYROGLOBULIN ANTIBODY: Thyroglobulin Ab: 39 [IU]/mL — ABNORMAL HIGH (ref <=1)

## 2024-10-01 LAB — THYROID PEROXIDASE ANTIBODY: Thyroperoxidase Ab SerPl-aCnc: 36 [IU]/mL — ABNORMAL HIGH (ref <9)

## 2024-10-01 LAB — THYROID PANEL WITH TSH
Free Thyroxine Index: 3.6 (ref 1.4–3.8)
T3 Uptake: 31 % (ref 22–35)
T4, Total: 11.6 ug/dL (ref 5.1–11.9)
TSH: 0.01 m[IU]/L — ABNORMAL LOW (ref 0.40–4.50)

## 2024-10-01 LAB — THYROGLOBULIN LEVEL: Thyroglobulin: <0.1 ng/mL — ABNORMAL LOW

## 2024-10-02 ENCOUNTER — Ambulatory Visit: Payer: Self-pay | Admitting: Internal Medicine

## 2024-10-02 DIAGNOSIS — E0500 Thyrotoxicosis with diffuse goiter without thyrotoxic crisis or storm: Secondary | ICD-10-CM

## 2024-10-02 MED ORDER — METHIMAZOLE 10 MG PO TABS: 10.0000 mg | ORAL_TABLET | Freq: Every day | ORAL | 0 refills | Status: DC

## 2024-10-02 NOTE — Assessment & Plan Note (Signed)
 Recurrent.  Starting methimazole   Lab Results  Component Value Date   TSH 0.01 (L) 09/28/2024   Lab Results  Component Value Date   TSH 0.01 (L) 09/28/2024   T4TOTAL 11.6 09/28/2024   THYROIDAB 36 (H) 09/28/2024

## 2024-10-04 ENCOUNTER — Ambulatory Visit: Payer: Self-pay | Admitting: Family

## 2024-10-08 ENCOUNTER — Encounter: Payer: Self-pay | Admitting: Pharmacist

## 2024-10-08 NOTE — Progress Notes (Signed)
 Pharmacy Quality Measure Review  This patient is appearing on a report for being at risk of failing the adherence measure for cholesterol (statin) medications this calendar year.   Medication: atorvastatin  20 mg  Last fill date: 06/30/24 for 90 day supply. 78%.   Insurance report was not up to date. No action needed at this time.  Medication has been refilled as of 09/28/24 x90 ds. 0 refills remaining.  Next refill due 2026.

## 2024-10-10 ENCOUNTER — Ambulatory Visit
Admission: RE | Admit: 2024-10-10 | Discharge: 2024-10-10 | Disposition: A | Discharge status: Home / Self Care | Source: Ambulatory Visit | Attending: Family | Admitting: Family

## 2024-10-10 DIAGNOSIS — M810 Age-related osteoporosis without current pathological fracture: Secondary | ICD-10-CM | POA: Diagnosis not present

## 2024-10-10 DIAGNOSIS — Z780 Asymptomatic menopausal state: Secondary | ICD-10-CM | POA: Insufficient documentation

## 2024-10-10 DIAGNOSIS — Z1231 Encounter for screening mammogram for malignant neoplasm of breast: Secondary | ICD-10-CM

## 2024-10-15 ENCOUNTER — Ambulatory Visit
Admission: RE | Admit: 2024-10-15 | Discharge: 2024-10-15 | Disposition: A | Discharge status: Home / Self Care | Source: Ambulatory Visit | Attending: Family | Admitting: Family

## 2024-10-15 DIAGNOSIS — M50221 Other cervical disc displacement at C4-C5 level: Secondary | ICD-10-CM | POA: Diagnosis not present

## 2024-10-15 DIAGNOSIS — M4802 Spinal stenosis, cervical region: Secondary | ICD-10-CM | POA: Diagnosis not present

## 2024-10-15 DIAGNOSIS — M5021 Other cervical disc displacement,  high cervical region: Secondary | ICD-10-CM | POA: Diagnosis not present

## 2024-10-15 DIAGNOSIS — M542 Cervicalgia: Secondary | ICD-10-CM | POA: Insufficient documentation

## 2024-10-18 ENCOUNTER — Ambulatory Visit: Payer: Self-pay | Admitting: Family

## 2024-10-18 ENCOUNTER — Telehealth: Payer: Self-pay

## 2024-10-18 DIAGNOSIS — M542 Cervicalgia: Secondary | ICD-10-CM

## 2024-10-18 NOTE — Telephone Encounter (Signed)
 LVM stating pt to call office back to receive Gastroenterology information also sent her the information to her MyChart

## 2024-10-18 NOTE — Telephone Encounter (Unsigned)
 Copied from CRM [id number]. Topic: General - Other >> Oct 18, 2024  3:57 PM [name] wrote: Reason for CRM: Patient called in stated she would like for Katelyn Friedman to give a call a back due to being scheduled for Endo and Colon and would like to know if MRI results from Monday are ready

## 2024-10-18 NOTE — Telephone Encounter (Signed)
 Copied from CRM #[id number]. Topic: Clinical - Medical Advice >> Oct 17, 2024  4:25 PM Nessti S wrote: Reason for CRM: patient would like pcp or nurse to call her back concerning if she needs to have the ENDOSCOPY appt for the COLONSCOPY appt. Call back number [phone]

## 2024-10-19 NOTE — Telephone Encounter (Signed)
 Copied from CRM [id number]. Topic: Clinical - Lab/Test Results >> Oct 18, 2024  4:56 PM Viola F wrote: Reason for CRM: Patient returned Jenate's phone call, I relayed message and she agreed to a consult with orthopedic and requested a call back from Jenate

## 2024-10-19 NOTE — Telephone Encounter (Signed)
 Called pt agreeable to consult

## 2024-10-19 NOTE — Telephone Encounter (Unsigned)
 Copied from CRM [id number]. Topic: Clinical - Lab/Test Results >> Oct 18, 2024  4:56 PM Viola F wrote: Reason for CRM: Patient returned Jenate's phone call, I relayed message and she agreed to a consult with orthopedic and requested a call back from [facility] >> Oct 19, 2024  1:26 PM Tysheama G wrote: Patient is calling back, she stated she wanted to speak with Bri to go into further details about her MRI.

## 2024-10-23 NOTE — Telephone Encounter (Signed)
 Call pt Ortho referral placed She may call [facility] to sch   GI and endo appt scheduled

## 2024-10-23 NOTE — Addendum Note (Signed)
 Addended by: DINEEN ROLLENE MATSU on: 10/23/2024 06:21 AM   Modules accepted: Orders

## 2024-10-23 NOTE — Telephone Encounter (Signed)
 Spoke to pt gave number to ortho pt verbalizes understanding

## 2024-10-25 DIAGNOSIS — E890 Postprocedural hypothyroidism: Secondary | ICD-10-CM | POA: Diagnosis not present

## 2024-10-25 DIAGNOSIS — R7989 Other specified abnormal findings of blood chemistry: Secondary | ICD-10-CM | POA: Diagnosis not present

## 2024-11-06 DIAGNOSIS — Z1211 Encounter for screening for malignant neoplasm of colon: Secondary | ICD-10-CM | POA: Diagnosis not present

## 2024-11-06 DIAGNOSIS — K5904 Chronic idiopathic constipation: Secondary | ICD-10-CM | POA: Diagnosis not present

## 2024-11-06 NOTE — Progress Notes (Signed)
 PATIENT PROFILE: Katelyn Friedman is a 73 y.o. female who presents to the [facility] GI for consultation at the request of Rollene Northern, NP, for chief complaint of routine-risk colon cancer screening and chronic constipation.   HISTORY OF PRESENT ILLNESS   Katelyn Friedman presents to the [facility] GI clinic at the request of her PCP for chief complaint of routine-risk colon cancer screening and chronic constipation. She presents to the clinic with her husband Katelyn Friedman. She had her last colonoscopy performed in 2015 by Dr. Dessa which removed one benign polyp. There is no known first-degree relatives with history of colon cancer or advanced adenomas. She denies any personal hx of adenomatous colon polyps. She has been dealing with chronic constipation for most of the last 10-years, but feels like issues have been much worse over the past year. She is going every 3-4 days in between bowel movements. She recently just picked up OTC stool softener, but hasn't found it to be very effective. She has not tried daily Miralax . She frequently gets the urge to have a BM, but then cannot pass one because nothing comes out or just small balls of stool come out. She gets some abdominal bloating and pressure with her constipation. She denies any abdominal pain, hematochezia, melena, fecal urgency, or fecal incontinence. Appetite and diet are stable. She has noticed a gradual weight loss over the past year of 20-lbs. She hasn't intentionally been trying to lose weight. She recently established care with Endocrinology for hx of postablative hypothyroidism. She denies any UGI symptoms such as nausea, vomiting, esophageal dysphagia, odynophagia, early satiety, hoarseness, or epigastric abdominal pain.    GENERAL REVIEW OF SYSTEMS: General: negative for - fever, chills, weight gain, + weight loss, - fatigue Head: no injury, migraines, headaches Eyes: no jaundice, itching, dryness, tearing, redness, vision  changes Nose: no injury, bleeding Mouth/Throat: no oral ulcers, swollen neck, dry mouth, sore throat, hoarseness Endocrine: no heat/cold intolerance Respiratory: no cough, wheezing, SOB Cardiovascular: no chest pain, palpitations GI: see HPI Musculoskeletal: no joint swelling, muscle/joint pain  Neurological: no seizures, syncope, dizziness, numbness/tingling Skin: no rashes, itching Hematological and Lymphatic: easy bruising, easy bleeding  MEDICATIONS: Outpatient Encounter Medications as of 11/06/2024  Medication Sig Dispense Refill  . acetaminophen  (TYLENOL ) 500 MG tablet Take 500-1,000 mg by mouth every 6 (six) hours as needed    . acyclovir  (ZOVIRAX ) 400 MG tablet Take 400 mg by mouth once daily    . albuterol  90 mcg/actuation inhaler Inhale 1-2 inhalations into the lungs every 6 (six) hours as needed for Shortness of Breath    . ascorbic acid, vitamin C, (VITAMIN C) 1000 MG tablet Take 1,000 mg by mouth once daily    . atorvastatin  (LIPITOR) 20 MG tablet Take 20 mg by mouth once daily    . cholecalciferol (VITAMIN D3) 2,000 unit tablet Take 2,000 Units by mouth once daily    . cyanocobalamin 2000 MCG tablet Take by mouth once daily    . ergocalciferol , vitamin D2, 50 mcg (2,000 unit) Cap 1 capsule    . ibuprofen (MOTRIN) 200 MG tablet Take 400 mg by mouth every 6 (six) hours as needed for Pain    . levothyroxine  (SYNTHROID ) 50 MCG tablet Take 50 mcg by mouth once daily (Patient not taking: Reported on 11/06/2024)    . levothyroxine  (SYNTHROID ) 88 MCG tablet Take 1 tablet by mouth every morning before breakfast (Patient not taking: Reported on 11/06/2024)    . sodium, potassium, and magnesium  (  SUPREP) oral solution Take 1 Bottle by mouth as directed One kit contains 2 bottles.  Take both bottles at the times instructed by your provider. 354 mL 0   No facility-administered encounter medications on file as of 11/06/2024.    ALLERGIES: Amoxicillin , Codeine, and Meloxicam   PAST  MEDICAL HISTORY: Past Medical History:  Diagnosis Date  . Allergic rhinitis   . Arthritis   . Family history of adverse reaction to anesthesia   . GERD (gastroesophageal reflux disease)   . Graves disease   . Herpes simplex   . History of colon polyps   . IBS (irritable bowel syndrome)   . PONV (postoperative nausea and vomiting)     PAST SURGICAL HISTORY: Past Surgical History:  Procedure Laterality Date  . KNEE ARTHROSCOPY  1992  . ARTHROSCOPIC ROTATOR CUFF REPAIR  2006  . Breast biopsy (Right)   2014  . COLONOSCOPY  05/07/2014  . Knee arthroscopy (Right)  06/08/2016  . CHOLECYSTECTOMY  09/21/2017  . ESSURE TUBAL LIGATION    . TONSILLECTOMY       FAMILY HISTORY: Family History  Problem Relation Name Age of Onset  . Heart disease Mother    . Diabetes Mother       SOCIAL HISTORY: Social History   Socioeconomic History  . Marital status: Married  Tobacco Use  . Smoking status: Never  . Smokeless tobacco: Never  Vaping Use  . Vaping status: Never Used  Substance and Sexual Activity  . Alcohol use: Not Currently  . Drug use: Not Currently  . Sexual activity: Defer   Social Drivers of Health   Financial Resource Strain: Medium Risk (11/06/2024)   Overall Financial Resource Strain (CARDIA)   . Difficulty of Paying Living Expenses: Somewhat hard  Food Insecurity: Food Insecurity Present (11/06/2024)   Hunger Vital Sign   . Worried About Programme Researcher, Broadcasting/film/video in the Last Year: Sometimes true   . Ran Out of Food in the Last Year: Never true  Transportation Needs: No Transportation Needs (11/06/2024)   PRAPARE - Transportation   . Lack of Transportation (Medical): No   . Lack of Transportation (Non-Medical): No    Vitals:   11/06/24 1405  BP: 118/74  Pulse: 58  Weight: 63.3 kg (139 lb 9.6 oz)  Height: 160 cm (5' 3)    Wt Readings from Last 3 Encounters:  11/06/24 63.3 kg (139 lb 9.6 oz)  10/25/24 62.6 kg (138 lb)  11/29/19 81.2 kg (179 lb)      PHYSICAL EXAM: Pleasant, non-toxic appearing female on exam bench. Answers all questions appropriately and thoroughly.  General: NAD, alert and oriented x 4 HEENT: PEERLA, EOMI, anciteric  Neck: supple, no JVD or thyromegaly. No lymphadenopathy.  Respiratory: CTA bilaterally, no wheezes, crackles, or other adventitious sounds Cardiac: RRR, no murmur, rub, or gallop  GI: soft, normal bowel sounds, no TTP, no HSM, no rebound or guarding Msk: no edema, well perfused with 2+ pulses, Skin: Skin color, texture, turgor normal, no rashes or lesions Lymph: no LAD Neuro: Grossly intact   REVIEW OF DATA: I have reviewed the following data today:  Previous OV notes with PCP reviewed - see HPI  ASSESSMENT AND PLAN: Katelyn Friedman is a 73 y.o. female presenting for an initial consultation.   Diagnoses and all orders for this visit:  Colon cancer screening -     Ambulatory Referral to Colonoscopy  Chronic idiopathic constipation  Other orders -     sodium, potassium, and magnesium  (SUPREP)  oral solution; Take 1 Bottle by mouth as directed One kit contains 2 bottles.  Take both bottles at the times instructed by your provider.   73 y/o AA female with a PMH of hypothyroidism following radioiodine therapy, abdominal aortic atherosclerosis, and GAD presents to the [facility] GI clinic for chief complaint of routine-risk colon cancer screening  Routine-risk colon cancer screening Chronic idiopathic constipation (CIC)  - Due for repeat screening. Last CSY 2015 was normal.  - CIC symptoms uncontrolled currently. Likely multifactorial in setting of slow-transit constipation, thyroid  disease, inadequate water  and fiber intake, etc.  - Recommend daily bowel regimen with Miralax  17 grams daily titrated to effect.  - Increase water  and fiber intake daily - Screening colonoscopy will be scheduled at [facility] per her request. She prefers [facility] instead of [facility]. If this colonoscopy is negative, will not  require any further screenings.  - Advised patient to call my office or message me if constipation does not improve over the next few weeks  I reviewed the risks (including bleeding, perforation, infection, anesthesia complications, cardiac/respiratory complications), benefits and alternatives of colonoscopy. Patient consents to proceed. Denies CP/SOB/blood thinners.   Attestation Statement:   I personally performed the service, non-incident to. [facility])   TWYLA JONETTE PRIMMER, PA     Jonette Primmer, PA-C [facility], Gastroenterology [address] [place], [place] [address] [phone]

## 2024-11-14 ENCOUNTER — Other Ambulatory Visit

## 2024-11-26 DIAGNOSIS — E890 Postprocedural hypothyroidism: Secondary | ICD-10-CM | POA: Diagnosis not present

## 2025-01-14 ENCOUNTER — Ambulatory Visit: Payer: PPO | Admitting: *Deleted

## 2025-01-14 VITALS — BP 128/62 | Ht 62.0 in | Wt 142.0 lb

## 2025-01-14 DIAGNOSIS — Z0000 Encounter for general adult medical examination without abnormal findings: Secondary | ICD-10-CM | POA: Diagnosis not present

## 2025-01-14 NOTE — Progress Notes (Signed)
 "  Chief Complaint  Patient presents with   Medicare Wellness     Subjective:   Katelyn Friedman is a 74 y.o. female who presents for a Medicare Annual Wellness Visit.  Visit info / Clinical Intake: Medicare Wellness Visit Type:: Subsequent Annual Wellness Visit Persons participating in visit and providing information:: patient Medicare Wellness Visit Mode:: Telephone If telephone:: video declined Since this visit was completed virtually, some vitals may be partially provided or unavailable. Missing vitals are due to the limitations of the virtual format.: Documented vitals are patient reported If Telephone or Video please confirm:: I connected with patient using audio/video enable telemedicine. I verified patient identity with two identifiers, discussed telehealth limitations, and patient agreed to proceed. Patient Location:: Home Provider Location:: Office/Home Interpreter Needed?: No Pre-visit prep was completed: yes AWV questionnaire completed by patient prior to visit?: no Living arrangements:: lives with spouse/significant other Patient's Overall Health Status Rating: good Typical amount of pain: none Does pain affect daily life?: no Are you currently prescribed opioids?: no  Dietary Habits and Nutritional Risks How many meals a day?: 2 Eats fruit and vegetables daily?: yes Most meals are obtained by: preparing own meals In the last 2 weeks, have you had any of the following?: none Diabetic:: no  Functional Status Activities of Daily Living (to include ambulation/medication): Independent Ambulation: Independent Medication Administration: Independent Home Management (perform basic housework or laundry): Independent Manage your own finances?: yes Primary transportation is: driving Concerns about vision?: (!) yes (doesn't like to drive at night) Concerns about hearing?: no  Fall Screening Falls in the past year?: 1 Number of falls in past year: 1 Was there an injury  with Fall?: 0 Fall Risk Category Calculator: 2 Patient Fall Risk Level: Moderate Fall Risk  Fall Risk Patient at Risk for Falls Due to: History of fall(s); Impaired balance/gait Fall risk Follow up: Falls evaluation completed; Falls prevention discussed  Home and Transportation Safety: All rugs have non-skid backing?: yes All stairs or steps have railings?: N/A, no stairs Grab bars in the bathtub or shower?: (!) no Have non-skid surface in bathtub or shower?: yes Good home lighting?: yes Regular seat belt use?: yes Hospital stays in the last year:: no  Cognitive Assessment Difficulty concentrating, remembering, or making decisions? : yes Will 6CIT or Mini Cog be Completed: yes What year is it?: 0 points What month is it?: 0 points Give patient an address phrase to remember (5 components): [address] [place] About what time is it?: 0 points Count backwards from 20 to 1: 0 points Say the months of the year in reverse: 4 points Repeat the address phrase from earlier: 0 points 6 CIT Score: 4 points  Advance Directives (For Healthcare) Does Patient Have a Medical Advance Directive?: No Would patient like information on creating a medical advance directive?: No - Patient declined  Reviewed/Updated  Reviewed/Updated: Reviewed All (Medical, Surgical, Family, Medications, Allergies, Care Teams, Patient Goals)    Allergies (verified) Influenza vaccines, Amoxicillin , Codeine, and Mobic  [meloxicam ]   Current Medications (verified) Outpatient Encounter Medications as of 01/14/2025  Medication Sig   acetaminophen  (TYLENOL ) 500 MG tablet Take 500-1,000 mg by mouth every 6 (six) hours as needed (for pain/headaches.).   acyclovir  (ZOVIRAX ) 400 MG tablet TAKE 1 TABLET BY MOUTH ONCE DAILY AS NEEDED FOR COLD SORE   Ascorbic Acid (VITAMIN C) 1000 MG tablet Take 1,000 mg by mouth daily.   aspirin  81 MG chewable tablet 1 tablet   atorvastatin  (LIPITOR) 20 MG  tablet Take 1 tablet (20  mg total) by mouth daily.   Cholecalciferol (VITAMIN D ) 2000 units tablet Take 2,000 Units by mouth daily.   Cyanocobalamin (VITAMIN B 12 PO) Take by mouth daily.   meclizine  (ANTIVERT ) 12.5 MG tablet Take 1 tablet (12.5 mg total) by mouth 3 (three) times daily as needed (vertigo).   methimazole  (TAPAZOLE ) 10 MG tablet Take 1 tablet (10 mg total) by mouth daily.   tizanidine  (ZANAFLEX ) 2 MG capsule Take 1-2 capsules (2-4 mg total) by mouth at bedtime as needed for muscle spasms.   No facility-administered encounter medications on file as of 01/14/2025.    History: Past Medical History:  Diagnosis Date   Allergic rhinitis    Arthritis    Family history of adverse reaction to anesthesia    PONV   GERD (gastroesophageal reflux disease)    Herpes simplex    History of colon polyps    IBS (irritable bowel syndrome)    PONV (postoperative nausea and vomiting)    Thyroid  disease    graves disease   Past Surgical History:  Procedure Laterality Date   BREAST BIOPSY Right 2014   negative   CHOLECYSTECTOMY N/A 09/21/2017   Procedure: LAPAROSCOPIC CHOLECYSTECTOMY WITH INTRAOPERATIVE CHOLANGIOGRAM;  Surgeon: Dessa Reyes ORN, MD;  Location: [facility] ORS;  Service: General;  Laterality: N/A;   COLONOSCOPY  05/07/2014   5 mm hyperplastic polyp removed from the rectosigmoid.  Maternal aunt with history colon cancer.  10-year follow-up planned.   KNEE ARTHROSCOPY  1992   KNEE ARTHROSCOPY Right 06/08/2016   Procedure: ARTHROSCOPY KNEE- partial medial minisectomy and condroplasty;  Surgeon: Maude Herald, MD;  Location: [facility] OR;  Service: Orthopedics;  Laterality: Right;   ROTATOR CUFF REPAIR  2006   [facility]    TONSILLECTOMY     TUBAL LIGATION     Family History  Problem Relation Age of Onset   Stroke Mother    Alzheimer's disease Mother    Hypertension Mother    Colon polyps Maternal Aunt    Cancer Maternal Aunt        colon   Breast cancer Maternal Grandmother    Breast cancer Other         m great GM   Social History   Occupational History   Not on file  Tobacco Use   Smoking status: Never   Smokeless tobacco: Never  Vaping Use   Vaping status: Never Used  Substance and Sexual Activity   Alcohol use: No    Alcohol/week: 0.0 standard drinks of alcohol   Drug use: No   Sexual activity: Yes    Partners: Male   Tobacco Counseling Counseling given: Not Answered  SDOH Screenings   Food Insecurity: No Food Insecurity (01/14/2025)  Recent Concern: Food Insecurity - Food Insecurity Present (11/06/2024)   Received from [facility]  Housing: Low Risk (01/14/2025)  Recent Concern: Housing - High Risk (01/11/2025)   Received from [facility]  Transportation Needs: No Transportation Needs (01/14/2025)  Utilities: Not At Risk (01/14/2025)  Recent Concern: Utilities - At Risk (11/06/2024)   Received from [facility]  Alcohol Screen: Low Risk (01/14/2025)  Depression (PHQ2-9): Medium Risk (01/14/2025)  Financial Resource Strain: Low Risk (01/14/2025)  Recent Concern: Financial Resource Strain - Medium Risk (11/06/2024)   Received from [facility]  Physical Activity: Insufficiently Active (01/14/2025)  Social Connections: Socially Integrated (01/14/2025)  Stress: No Stress Concern Present (01/14/2025)  Tobacco Use: Low  Risk (01/14/2025)  Health Literacy: Adequate Health Literacy (01/14/2025)   See flowsheets for full screening details  Depression Screen PHQ 2 & 9 Depression Scale- Over the past 2 weeks, how often have you been bothered by any of the following problems? Little interest or pleasure in doing things: 0 Feeling down, depressed, or hopeless (PHQ Adolescent also includes...irritable): 1 PHQ-2 Total Score: 1 Trouble falling or staying asleep, or sleeping too much: 3 Feeling tired or having little energy: 3 Poor appetite or overeating (PHQ Adolescent also includes...weight loss): 1 Feeling bad  about yourself - or that you are a failure or have let yourself or your family down: 0 Trouble concentrating on things, such as reading the newspaper or watching television (PHQ Adolescent also includes...like school work): 0 Moving or speaking so slowly that other people could have noticed. Or the opposite - being so fidgety or restless that you have been moving around a lot more than usual: 0 Thoughts that you would be better off dead, or of hurting yourself in some way: 0 PHQ-9 Total Score: 8 If you checked off any problems, how difficult have these problems made it for you to do your work, take care of things at home, or get along with other people?: Not difficult at all     Goals Addressed             This Visit's Progress    Patient Stated       Wants to continue to eat well and exercise             Objective:    Today's Vitals   01/14/25 1508  BP: 128/62  Weight: 142 lb (64.4 kg)  Height: 5' 2 (1.575 m)   Body mass index is 25.97 kg/m.  Hearing/Vision screen Hearing Screening - Comments:: No issues Vision Screening - Comments:: Glasses, [facility], has an appointment this week Immunizations and Health Maintenance Health Maintenance  Topic Date Due   Zoster Vaccines- Shingrix (2 of 2) 09/29/2022   Colonoscopy  05/07/2024   Influenza Vaccine  07/27/2024   COVID-19 Vaccine (6 - 2025-26 season) 08/27/2024   Mammogram  10/10/2025   Medicare Annual Wellness (AWV)  01/14/2026   DTaP/Tdap/Td (3 - Td or Tdap) 08/04/2032   Pneumococcal Vaccine: 50+ Years  Completed   Bone Density Scan  Completed   Hepatitis C Screening  Completed   Meningococcal B Vaccine  Aged Out        Assessment/Plan:  This is a routine wellness examination for Chai.  Patient Care Team: Marylynn Verneita CROME, MD as PCP - General (Internal Medicine) Marylynn Verneita CROME, MD (Internal Medicine) Samuella Twyla HERO, PA-C (Gastroenterology) Brendia Calton Squires, PA-C  (Endocrinology)  I have personally reviewed and noted the following in the patients chart:   Medical and social history Use of alcohol, tobacco or illicit drugs  Current medications and supplements including opioid prescriptions. Functional ability and status Nutritional status Physical activity Advanced directives List of other physicians Hospitalizations, surgeries, and ER visits in previous 12 months Vitals Screenings to include cognitive, depression, and falls Referrals and appointments  No orders of the defined types were placed in this encounter.  In addition, I have reviewed and discussed with patient certain preventive protocols, quality metrics, and best practice recommendations. A written personalized care plan for preventive services as well as general preventive health recommendations were provided to patient.   Angeline Fredericks, LPN   8/80/7973   Return in 1 year (on 01/14/2026).  After  Visit Summary: (MyChart) Due to this being a telephonic visit, the after visit summary with patients personalized plan was offered to patient via MyChart   Nurse Notes: Discussed the need to update shingles and covid vaccines. Patient  scheduled to have a colonoscopy tomorrow 01/15/25. "

## 2025-01-14 NOTE — Patient Instructions (Signed)
 Katelyn Friedman,  Thank you for taking the time for your Medicare Wellness Visit. I appreciate your continued commitment to your health goals. Please review the care plan we discussed, and feel free to reach out if I can assist you further.  Please note that Annual Wellness Visits do not include a physical exam. Some assessments may be limited, especially if the visit was conducted virtually. If needed, we may recommend an in-person follow-up with your provider.  Ongoing Care Seeing your primary care provider every 3 to 6 months helps us  monitor your health and provide consistent, personalized care.  Remember to update your shingles an covid vaccines.  Referrals If a referral was made during today's visit and you haven't received any updates within two weeks, please contact the referred provider directly to check on the status.  Recommended Screenings:  Health Maintenance  Topic Date Due   Zoster (Shingles) Vaccine (2 of 2) 09/29/2022   Colon Cancer Screening  05/07/2024   COVID-19 Vaccine (6 - 2025-26 season) 08/27/2024   Breast Cancer Screening  10/10/2025   Medicare Annual Wellness Visit  01/14/2026   DTaP/Tdap/Td vaccine (3 - Td or Tdap) 08/04/2032   Pneumococcal Vaccine for age over 72  Completed   Flu Shot  Completed   Osteoporosis screening with Bone Density Scan  Completed   Hepatitis C Screening  Completed   Meningitis B Vaccine  Aged Out       01/14/2025    3:12 PM  Advanced Directives  Does Patient Have a Medical Advance Directive? No  Would patient like information on creating a medical advance directive? No - Patient declined    Vision: Annual vision screenings are recommended for early detection of glaucoma, cataracts, and diabetic retinopathy. These exams can also reveal signs of chronic conditions such as diabetes and high blood pressure.  Dental: Annual dental screenings help detect early signs of oral cancer, gum disease, and other conditions linked to overall  health, including heart disease and diabetes.  Please see the attached documents for additional preventive care recommendations.

## 2025-01-15 ENCOUNTER — Ambulatory Visit: Payer: Self-pay

## 2025-01-15 DIAGNOSIS — D122 Benign neoplasm of ascending colon: Secondary | ICD-10-CM | POA: Diagnosis not present

## 2025-01-15 DIAGNOSIS — Z1211 Encounter for screening for malignant neoplasm of colon: Secondary | ICD-10-CM | POA: Diagnosis not present

## 2025-01-15 DIAGNOSIS — K5730 Diverticulosis of large intestine without perforation or abscess without bleeding: Secondary | ICD-10-CM | POA: Diagnosis not present

## 2025-01-18 ENCOUNTER — Encounter: Payer: Self-pay | Admitting: Internal Medicine

## 2025-01-18 ENCOUNTER — Ambulatory Visit: Admitting: Internal Medicine

## 2025-01-18 VITALS — BP 120/66 | HR 62 | Temp 98.3°F | Ht 62.0 in | Wt 137.8 lb

## 2025-01-18 DIAGNOSIS — F5105 Insomnia due to other mental disorder: Secondary | ICD-10-CM | POA: Diagnosis not present

## 2025-01-18 DIAGNOSIS — E785 Hyperlipidemia, unspecified: Secondary | ICD-10-CM

## 2025-01-18 DIAGNOSIS — K635 Polyp of colon: Secondary | ICD-10-CM | POA: Diagnosis not present

## 2025-01-18 DIAGNOSIS — E890 Postprocedural hypothyroidism: Secondary | ICD-10-CM | POA: Diagnosis not present

## 2025-01-18 DIAGNOSIS — R5383 Other fatigue: Secondary | ICD-10-CM

## 2025-01-18 DIAGNOSIS — H8110 Benign paroxysmal vertigo, unspecified ear: Secondary | ICD-10-CM

## 2025-01-18 DIAGNOSIS — R7303 Prediabetes: Secondary | ICD-10-CM | POA: Diagnosis not present

## 2025-01-18 DIAGNOSIS — E0500 Thyrotoxicosis with diffuse goiter without thyrotoxic crisis or storm: Secondary | ICD-10-CM

## 2025-01-18 DIAGNOSIS — F418 Other specified anxiety disorders: Secondary | ICD-10-CM | POA: Diagnosis not present

## 2025-01-18 DIAGNOSIS — R1031 Right lower quadrant pain: Secondary | ICD-10-CM

## 2025-01-18 DIAGNOSIS — F419 Anxiety disorder, unspecified: Secondary | ICD-10-CM

## 2025-01-18 MED ORDER — ATORVASTATIN CALCIUM 20 MG PO TABS: 20.0000 mg | ORAL_TABLET | Freq: Every day | ORAL | 3 refills | Status: AC

## 2025-01-18 MED ORDER — ESCITALOPRAM OXALATE 5 MG PO TABS: 5.0000 mg | ORAL_TABLET | Freq: Every day | ORAL | 1 refills | Status: AC

## 2025-01-18 MED ORDER — LEVOTHYROXINE SODIUM 75 MCG PO TABS: 75.0000 ug | ORAL_TABLET | Freq: Every day | ORAL | 0 refills | Status: AC

## 2025-01-18 MED ORDER — FLUTICASONE PROPIONATE 50 MCG/ACT NA SUSP: 2.0000 | Freq: Every day | NASAL | 6 refills | Status: AC

## 2025-01-18 NOTE — Assessment & Plan Note (Signed)
 1 cm  FOUND IN THE ASCENDING COLON  ON THE jAN 2026  PATH PENDING

## 2025-01-18 NOTE — Patient Instructions (Addendum)
 Your dizziness is called benign positional vertigo and may be related to your sinus congestion   Start using Flonase steroid nasal  spray once daily   (rx sent to pharmacy).  I ALSO want you to use Afrin for the first 3-4 days,    (ok to by  the generic oxymetazolonet).  Use it twice daily  FOR 3 DAYS,  but then stop the afrin AND CONTINUE THE fLONASE   STARTING LEXAPRO  FOR DEPRESSION.   TAKE  DAILY IN THE MORNING AFTER YOU HAVE EATEN.   YOUR THYROID  IS LESS UNDERACTIVE THAN IT WAS IN DECEMBER  BUT STILL UNDERACTIVE. PLEASE INCREASE YOUR DOSE OF LEVOTHYOXINE TO 75 MCG DAILY AND PLAN TO REPEAT THE THYROID  TeST ON OR AROUND MARCH 4

## 2025-01-18 NOTE — Progress Notes (Unsigned)
 "  Subjective:  Patient ID: Katelyn Friedman, female    DOB: Jan 11, 1951  Age: 74 y.o. MRN: [id number]  CC: The primary encounter diagnosis was Prediabetes. Diagnoses of Graves disease, Hyperlipidemia, unspecified hyperlipidemia type, and Other fatigue were also pertinent to this visit.   HPI Katelyn Friedman presents for  Chief Complaint  Patient presents with   Depression   Balance Issues   Medication Management   Hyperlipidemia   Graves' Disease   1) balance issues with position change . Vertigo.  Had eye exam for eye pain, ,  told to take an OTC eye drop for dry eyes. [facility]   2) depression, still  feeling overwhelmed even though care of sister in law has now been delegated  Waking up at night worrying , can't go back to sleep from 3 am on . SABRA  Feels better when she is out of the home   3) graves hypothyroid managed by Endocrine  now.  Most recent TSH was 9    4) BPPV occurring a few times weekly   with bending over,  getting up suddenly    Outpatient Medications Prior to Visit  Medication Sig Dispense Refill   acetaminophen  (TYLENOL ) 500 MG tablet Take 500-1,000 mg by mouth every 6 (six) hours as needed (for pain/headaches.).     acyclovir  (ZOVIRAX ) 400 MG tablet TAKE 1 TABLET BY MOUTH ONCE DAILY AS NEEDED FOR COLD SORE 30 tablet 0   Ascorbic Acid (VITAMIN C) 1000 MG tablet Take 1,000 mg by mouth daily.     aspirin  81 MG chewable tablet 1 tablet     atorvastatin  (LIPITOR) 20 MG tablet Take 1 tablet (20 mg total) by mouth daily. 90 tablet 0   Cholecalciferol (VITAMIN D ) 2000 units tablet Take 2,000 Units by mouth daily.     levothyroxine  (SYNTHROID ) 50 MCG tablet Take 50 mcg by mouth.     levothyroxine  (SYNTHROID ) 75 MCG tablet Take 75 mcg by mouth daily.     methimazole  (TAPAZOLE ) 10 MG tablet Take 1 tablet (10 mg total) by mouth daily. 90 tablet 0   Cyanocobalamin (VITAMIN B 12 PO) Take by mouth daily. (Patient not taking: Reported on 01/18/2025)     meclizine   (ANTIVERT ) 12.5 MG tablet Take 1 tablet (12.5 mg total) by mouth 3 (three) times daily as needed (vertigo). (Patient not taking: Reported on 01/18/2025) 30 tablet 0   tizanidine  (ZANAFLEX ) 2 MG capsule Take 1-2 capsules (2-4 mg total) by mouth at bedtime as needed for muscle spasms. (Patient not taking: Reported on 01/18/2025) 30 capsule 0   No facility-administered medications prior to visit.    Review of Systems;  Patient denies headache, fevers, malaise, unintentional weight loss, skin rash, eye pain, sinus congestion and sinus pain, sore throat, dysphagia,  hemoptysis , cough, dyspnea, wheezing, chest pain, palpitations, orthopnea, edema, abdominal pain, nausea, melena, diarrhea, constipation, flank pain, dysuria, hematuria, urinary  Frequency, nocturia, numbness, tingling, seizures,  Focal weakness, Loss of consciousness,  Tremor, insomnia, depression, anxiety, and suicidal ideation.      Objective:  BP 120/66 (BP Location: Left Arm, Patient Position: Sitting)   Pulse 62   Temp 98.3 F (36.8 C) (Oral)   Ht 5' 2 (1.575 m)   Wt 137 lb 12.8 oz (62.5 kg)   SpO2 99%   BMI 25.20 kg/m   BP Readings from Last 3 Encounters:  01/18/25 120/66  01/14/25 128/62  07/27/24 122/76    Wt Readings from Last 3 Encounters:  01/18/25  137 lb 12.8 oz (62.5 kg)  01/14/25 142 lb (64.4 kg)  07/27/24 136 lb 9.6 oz (62 kg)    Physical Exam  Lab Results  Component Value Date   HGBA1C 4.6 12/06/2023   HGBA1C 4.4 10/30/2021   HGBA1C 6.0 06/19/2020    Lab Results  Component Value Date   CREATININE 0.88 08/06/2024   CREATININE 0.93 12/05/2023   CREATININE 0.90 04/21/2023    Lab Results  Component Value Date   WBC 7.1 01/15/2021   HGB 13.1 01/15/2021   HCT 39.6 01/15/2021   PLT 267 01/15/2021   GLUCOSE 87 08/06/2024   CHOL 132 08/06/2024   TRIG 49.0 08/06/2024   HDL 59.60 08/06/2024   LDLDIRECT 151 (H) 08/25/2016   LDLCALC 62 08/06/2024   ALT 10 08/06/2024   AST 14 08/06/2024   NA  140 08/06/2024   K 4.2 08/06/2024   CL 104 08/06/2024   CREATININE 0.88 08/06/2024   BUN 14 08/06/2024   CO2 29 08/06/2024   TSH 0.01 (L) 09/28/2024   INR 1.0 05/09/2018   HGBA1C 4.6 12/06/2023    MR Cervical Spine Wo Contrast Result Date: 10/18/2024 EXAM: MRI CERVICAL SPINE WITHOUT CONTRAST 10/15/2024 04:03:00 PM TECHNIQUE: Multiplanar multisequence MRI of the cervical spine was performed. COMPARISON: None available. CLINICAL HISTORY: Cervical radiculopathy, infection suspected, positive xray/CT; abnormal xray. Neck pain; Assessment \\T \ Plan: C/w with muscle spasm. Suspect underlying DDD. FINDINGS: BONES AND ALIGNMENT: Normal alignment. Normal vertebral body heights. Bone marrow signal is unremarkable. SPINAL CORD: Normal spinal cord size. No abnormal spinal cord signal. SOFT TISSUES: No paraspinal mass. C2-C3: No significant disc herniation. No spinal canal stenosis or neural foraminal narrowing. C3-C4: Small disc bulge with uncovertebral spurring. No central spinal canal or neural foraminal stenosis. C4-C5: Small disc bulge with uncovertebral hypertrophy. Mild bilateral foraminal stenosis. No spinal canal stenosis. C5-C6: No significant disc herniation. Bilateral uncovertebral hypertrophy. Severe bilateral foraminal stenosis. No spinal canal stenosis. C6-C7: Disc space narrowing with minimal bulge. No central spinal canal or neural foraminal stenosis. C7-T1: No significant disc herniation. No spinal canal stenosis or neural foraminal narrowing. IMPRESSION: 1. C5-C6: Bilateral uncovertebral hypertrophy causing severe bilateral foraminal stenosis. No spinal canal stenosis. 2. C4-C5: Small disc bulge with uncovertebral hypertrophy causing mild bilateral foraminal stenosis. No spinal canal stenosis. Electronically signed by: Franky Stanford MD 10/18/2024 02:37 AM EDT RP Workstation: [id number]    Assessment & Plan:  .Prediabetes  Graves disease  Hyperlipidemia, unspecified hyperlipidemia  type  Other fatigue     I spent 34 minutes on the day of this face to face encounter reviewing patient's  most recent visit with cardiology,  nephrology,  and neurology,  prior relevant surgical and non surgical procedures, recent  labs and imaging studies, counseling on weight management,  reviewing the assessment and plan with patient, and post visit ordering and reviewing of  diagnostics and therapeutics with patient  .   Follow-up: No follow-ups on file.   Katelyn LITTIE Kettering, MD "

## 2025-01-20 DIAGNOSIS — H8110 Benign paroxysmal vertigo, unspecified ear: Secondary | ICD-10-CM | POA: Insufficient documentation

## 2025-01-20 LAB — HEMOGLOBIN A1C
Est. average glucose Bld gHb Est-mCnc: 91 mg/dL
Hgb A1c MFr Bld: 4.8 (ref 4.8–5.6)

## 2025-01-20 LAB — CBC WITH DIFFERENTIAL/PLATELET
Basophils Absolute: 0.1 10*3/uL (ref 0.0–0.2)
Basos: 1 %
EOS (ABSOLUTE): 0.1 10*3/uL (ref 0.0–0.4)
Eos: 1 %
Hematocrit: 37.1 % (ref 34.0–46.6)
Hemoglobin: 11.6 g/dL (ref 11.1–15.9)
Immature Grans (Abs): 0 10*3/uL (ref 0.0–0.1)
Immature Granulocytes: 0 %
Lymphocytes Absolute: 2.1 10*3/uL (ref 0.7–3.1)
Lymphs: 31 %
MCH: 28.6 pg (ref 26.6–33.0)
MCHC: 31.3 g/dL — ABNORMAL LOW (ref 31.5–35.7)
MCV: 92 fL (ref 79–97)
Monocytes Absolute: 0.3 10*3/uL (ref 0.1–0.9)
Monocytes: 5 %
Neutrophils Absolute: 4.3 10*3/uL (ref 1.4–7.0)
Neutrophils: 62 %
Platelets: 234 10*3/uL (ref 150–450)
RBC: 4.05 x10E6/uL (ref 3.77–5.28)
RDW: 16 % — ABNORMAL HIGH (ref 11.7–15.4)
WBC: 6.9 10*3/uL (ref 3.4–10.8)

## 2025-01-20 LAB — COMPREHENSIVE METABOLIC PANEL WITH GFR
ALT: 10 [IU]/L (ref 0–32)
AST: 19 [IU]/L (ref 0–40)
Albumin: 4.3 g/dL (ref 3.8–4.8)
Alkaline Phosphatase: 56 [IU]/L (ref 49–135)
BUN/Creatinine Ratio: 14 (ref 12–28)
BUN: 13 mg/dL (ref 8–27)
Bilirubin Total: 0.4 mg/dL (ref 0.0–1.2)
CO2: 25 mmol/L (ref 20–29)
Calcium: 9.3 mg/dL (ref 8.7–10.3)
Chloride: 104 mmol/L (ref 96–106)
Creatinine, Ser: 0.92 mg/dL (ref 0.57–1.00)
Globulin, Total: 2 g/dL (ref 1.5–4.5)
Glucose: 95 mg/dL (ref 70–99)
Potassium: 4.3 mmol/L (ref 3.5–5.2)
Sodium: 143 mmol/L (ref 134–144)
Total Protein: 6.3 g/dL (ref 6.0–8.5)
eGFR: 65 mL/min/{1.73_m2}

## 2025-01-20 LAB — LDL CHOLESTEROL, DIRECT: LDL Direct: 58 mg/dL (ref 0–99)

## 2025-01-20 LAB — LIPID PANEL
Chol/HDL Ratio: 2.2 ratio (ref 0.0–4.4)
Cholesterol, Total: 128 mg/dL (ref 100–199)
HDL: 59 mg/dL
LDL Chol Calc (NIH): 56 mg/dL (ref 0–99)
Triglycerides: 59 mg/dL (ref 0–149)
VLDL Cholesterol Cal: 13 mg/dL (ref 5–40)

## 2025-01-20 NOTE — Assessment & Plan Note (Addendum)
 Starting lexapro  for concurrent symptoms of depression and anxiety

## 2025-01-20 NOTE — Assessment & Plan Note (Signed)
 She prefers to avoid medication,  melatonin use recommended

## 2025-01-20 NOTE — Assessment & Plan Note (Signed)
 Aggravated by sinus congestion.  Will treat for Eustachian tube dysfunction  with Floase and afrin

## 2025-01-20 NOTE — Assessment & Plan Note (Signed)
 Secondary to Graves disease.  Thyroid  became overactive again and she was seen by Endocrinology on October 30.  Levothyroxine  was suspended and TSH was 100 on Dec 1 .  She has resumed 75 .mcg and repeat TSH was 9 last week.  Increasing dose to 88 mcg    .

## 2025-01-21 ENCOUNTER — Ambulatory Visit: Payer: Self-pay | Admitting: Internal Medicine

## 2026-01-15 ENCOUNTER — Ambulatory Visit
